# Patient Record
Sex: Female | Born: 1945 | Race: White | Hispanic: No | Smoking: Never smoker
Health system: Southern US, Academic
[De-identification: ages and names within clinical notes are randomized; demographics above are authoritative.]

## PROBLEM LIST (undated history)

## (undated) DIAGNOSIS — Z87898 Personal history of other specified conditions: Secondary | ICD-10-CM

## (undated) DIAGNOSIS — I10 Essential (primary) hypertension: Secondary | ICD-10-CM

## (undated) DIAGNOSIS — F419 Anxiety disorder, unspecified: Secondary | ICD-10-CM

## (undated) DIAGNOSIS — M2550 Pain in unspecified joint: Secondary | ICD-10-CM

## (undated) DIAGNOSIS — E876 Hypokalemia: Secondary | ICD-10-CM

## (undated) DIAGNOSIS — K219 Gastro-esophageal reflux disease without esophagitis: Secondary | ICD-10-CM

## (undated) DIAGNOSIS — F32A Depression, unspecified: Secondary | ICD-10-CM

## (undated) DIAGNOSIS — G510 Bell's palsy: Secondary | ICD-10-CM

## (undated) DIAGNOSIS — Z87440 Personal history of urinary (tract) infections: Secondary | ICD-10-CM

## (undated) DIAGNOSIS — Z9229 Personal history of other drug therapy: Secondary | ICD-10-CM

## (undated) DIAGNOSIS — N289 Disorder of kidney and ureter, unspecified: Secondary | ICD-10-CM

## (undated) HISTORY — PX: BRAIN TUMOR EXCISION: SHX577

## (undated) HISTORY — DX: Pain in unspecified joint: M25.50

## (undated) HISTORY — DX: Anxiety disorder, unspecified: F41.9

## (undated) HISTORY — DX: Personal history of other drug therapy: Z92.29

## (undated) HISTORY — DX: Essential (primary) hypertension: I10

## (undated) HISTORY — DX: Personal history of other specified conditions: Z87.898

## (undated) HISTORY — DX: Disorder of kidney and ureter, unspecified: N28.9

## (undated) HISTORY — DX: Hypokalemia: E87.6

## (undated) HISTORY — PX: HX LIPOMA RESECTION: SHX23

## (undated) HISTORY — DX: Personal history of urinary (tract) infections: Z87.440

## (undated) HISTORY — DX: Depression, unspecified: F32.A

## (undated) HISTORY — DX: Bell's palsy: G51.0

## (undated) HISTORY — DX: Gastro-esophageal reflux disease without esophagitis: K21.9

---

## 1993-11-25 ENCOUNTER — Ambulatory Visit (HOSPITAL_COMMUNITY): Payer: Self-pay

## 2020-12-23 IMAGING — US US ABDOMEN COMPLETE
2 series · 14 of 25 positions shown · non-contrast
Comparison: None.

﻿EXAM:  US ABDOMEN COMPLETE
INDICATION: Mid abdominal pain.

[Series 1: us abdomen complete · 12 of 53 slices shown (1 of 2)]
[im 1/53]
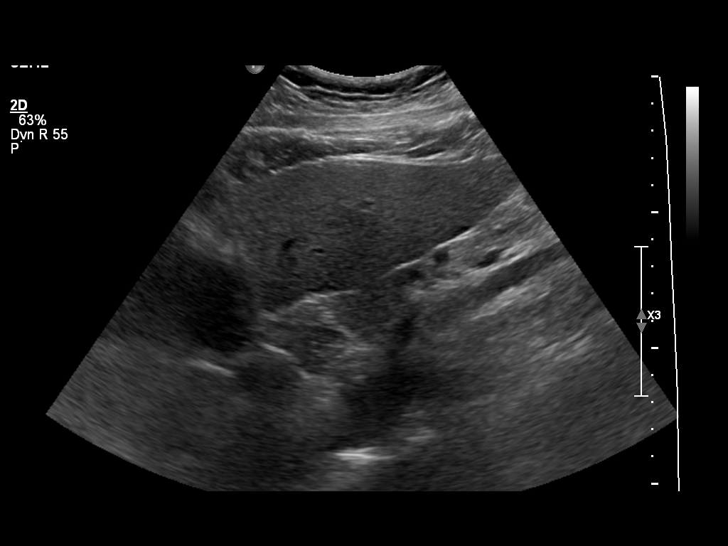
[im 6/53]
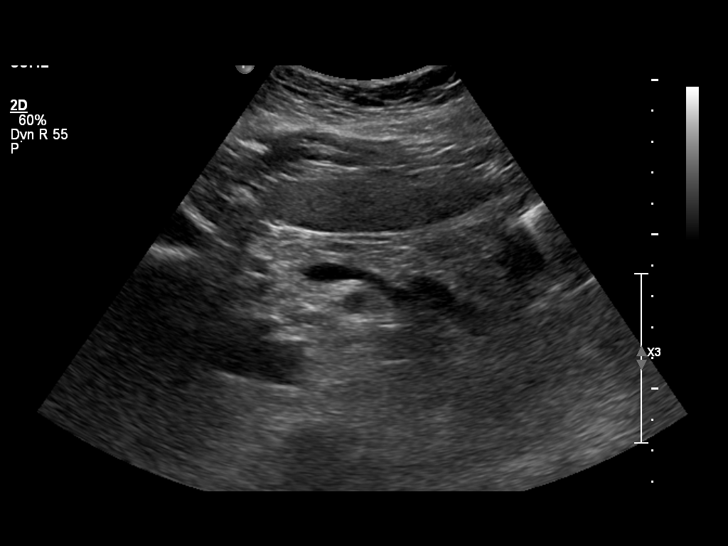
[im 11/53]
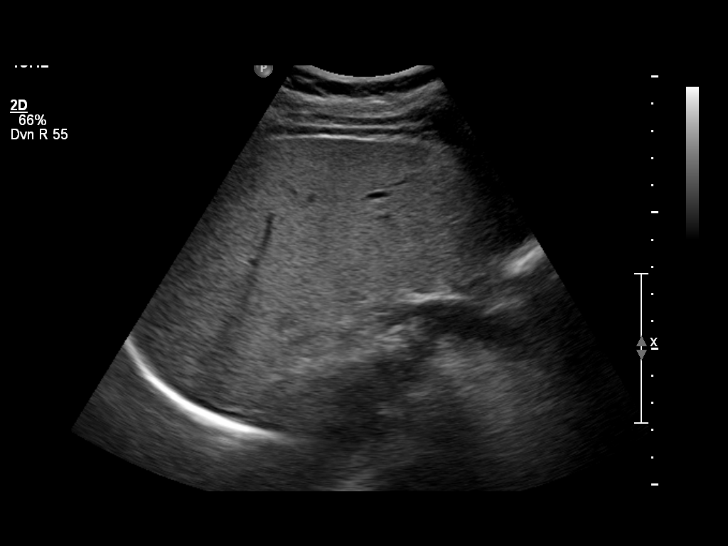
[im 16/53]
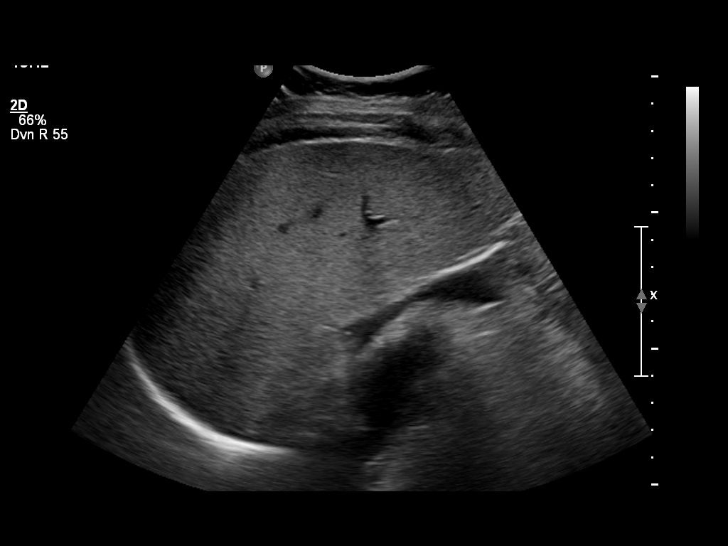
[im 21/53]
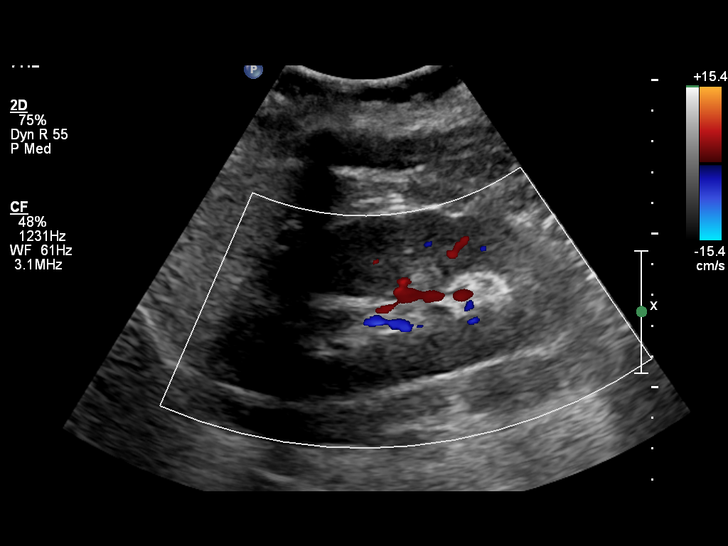
[im 24/53]
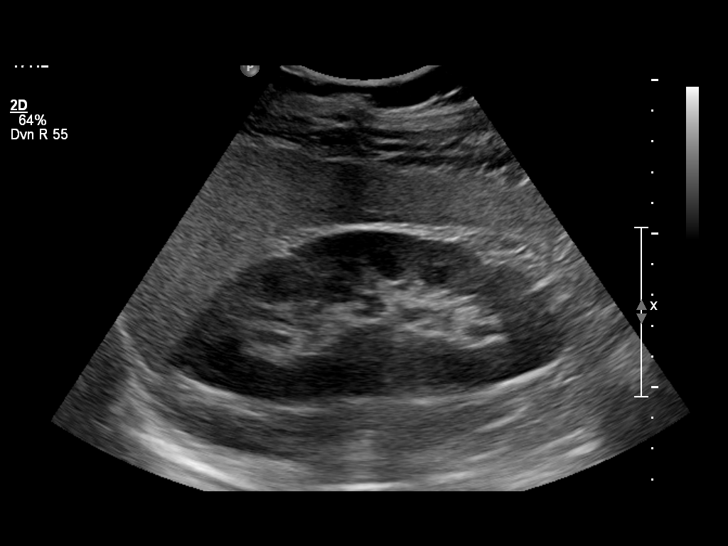
[im 29/53]
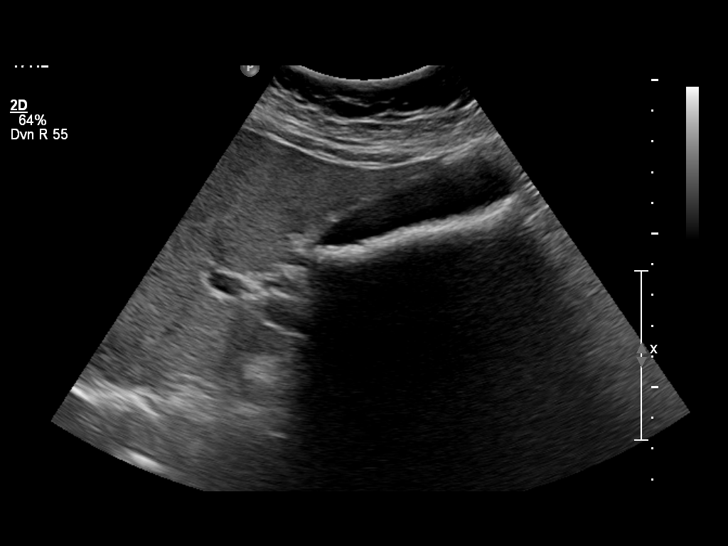
[im 34/53]
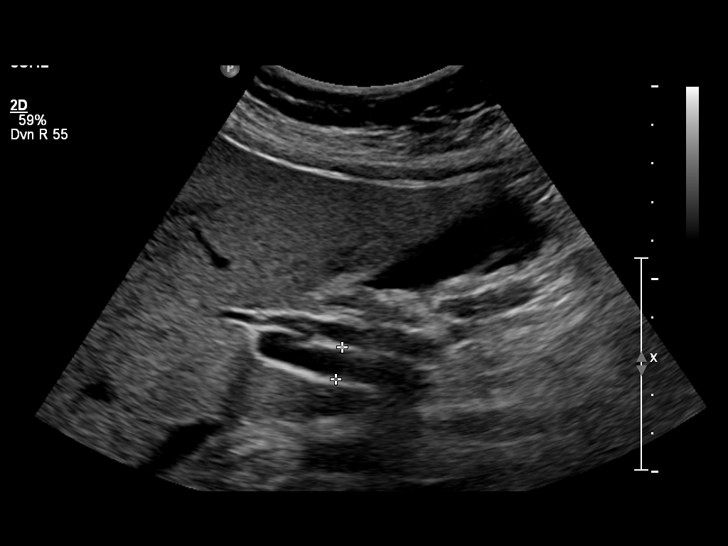
[im 40/53]
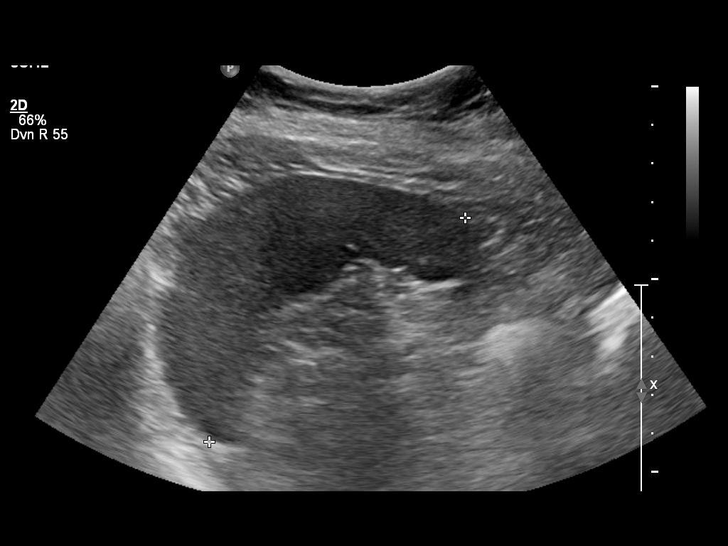
[im 42/53]
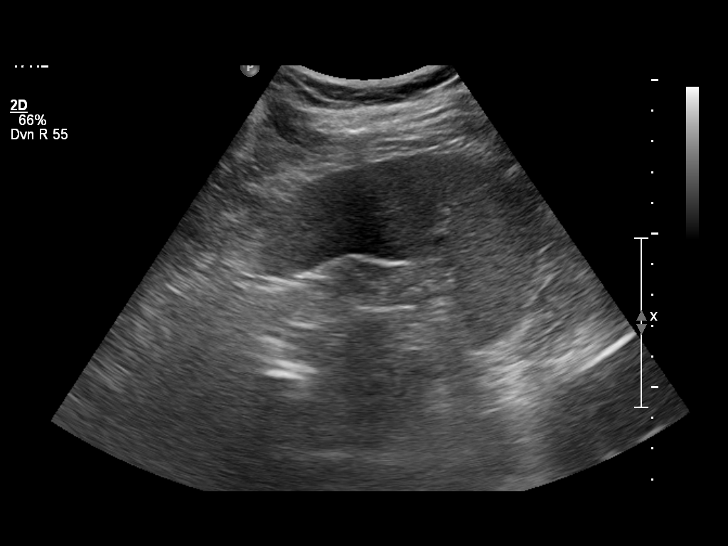
[im 47/53]
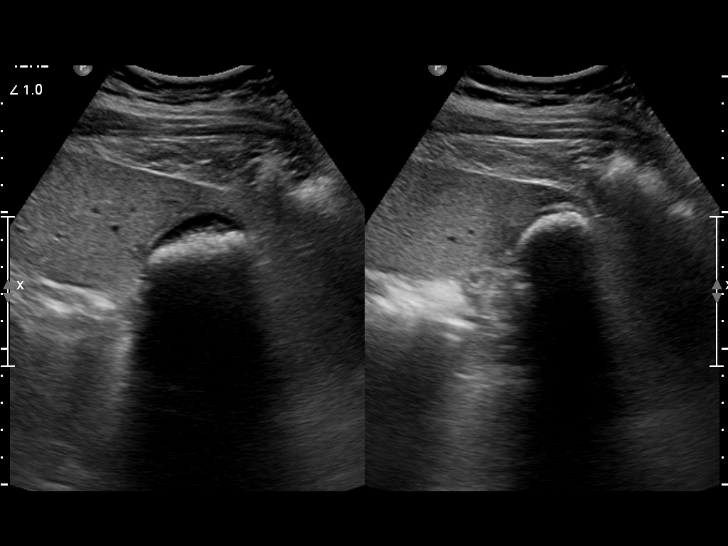
[im 53/53]
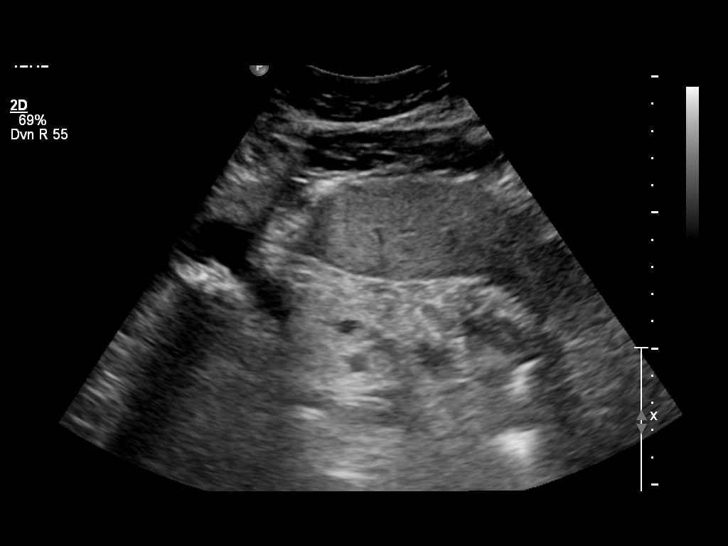

[Series 1: us abdomen complete · 2 of 10 slices shown (2 of 2)]
[im 4/10]
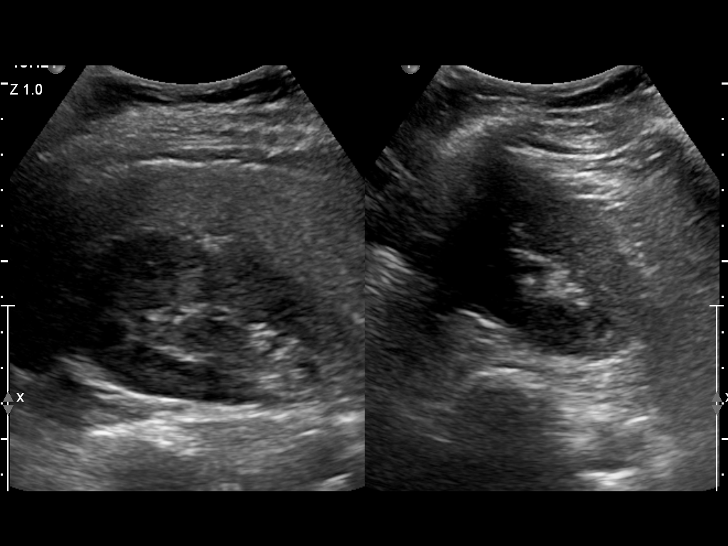
[im 10/10]
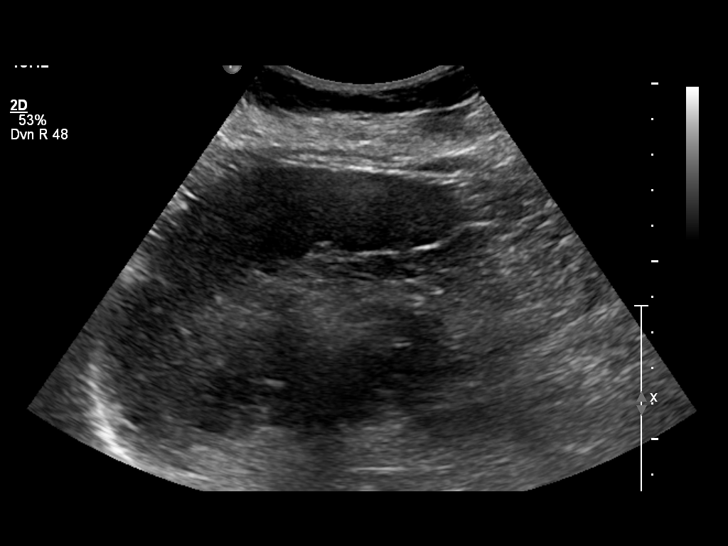

[14 of 25 positions shown; findings below may reference images not displayed]

FINDINGS: The liver appears unremarkable with no evidence of mass or biliary dilatation.  There are multiple gallstones within the gallbladder with acoustical shadowing.  There is no gallbladder wall thickening or pericholecystic fluid.  

There is a small right renal cyst.  The left kidney has been removed.  The visualized pancreas, aorta, and inferior vena cava appear unremarkable.  There is no ascites.  The spleen is not enlarged.
IMPRESSION: Cholelithiasis.

## 2021-11-16 ENCOUNTER — Encounter (RURAL_HEALTH_CENTER): Payer: Self-pay | Admitting: Physician Assistant

## 2021-11-16 DIAGNOSIS — F419 Anxiety disorder, unspecified: Secondary | ICD-10-CM | POA: Insufficient documentation

## 2021-11-16 DIAGNOSIS — M25561 Pain in right knee: Secondary | ICD-10-CM | POA: Insufficient documentation

## 2021-11-16 DIAGNOSIS — E876 Hypokalemia: Secondary | ICD-10-CM | POA: Insufficient documentation

## 2021-11-16 DIAGNOSIS — K219 Gastro-esophageal reflux disease without esophagitis: Secondary | ICD-10-CM | POA: Insufficient documentation

## 2021-11-16 DIAGNOSIS — I10 Essential (primary) hypertension: Secondary | ICD-10-CM | POA: Insufficient documentation

## 2021-11-16 DIAGNOSIS — F32A Depression, unspecified: Secondary | ICD-10-CM | POA: Insufficient documentation

## 2021-11-16 DIAGNOSIS — M25552 Pain in left hip: Secondary | ICD-10-CM | POA: Insufficient documentation

## 2021-12-11 ENCOUNTER — Encounter (RURAL_HEALTH_CENTER): Payer: Self-pay | Admitting: Physician Assistant

## 2021-12-18 ENCOUNTER — Encounter (RURAL_HEALTH_CENTER): Payer: Self-pay | Admitting: Physician Assistant

## 2021-12-23 ENCOUNTER — Ambulatory Visit (RURAL_HEALTH_CENTER): Payer: Self-pay | Admitting: Physician Assistant

## 2021-12-30 ENCOUNTER — Other Ambulatory Visit: Payer: Medicare Other | Attending: Physician Assistant | Admitting: Physician Assistant

## 2021-12-30 ENCOUNTER — Other Ambulatory Visit: Payer: Self-pay

## 2021-12-30 ENCOUNTER — Encounter (RURAL_HEALTH_CENTER): Payer: Self-pay | Admitting: Physician Assistant

## 2021-12-30 ENCOUNTER — Ambulatory Visit (RURAL_HEALTH_CENTER): Payer: Medicare Other | Attending: Physician Assistant | Admitting: Physician Assistant

## 2021-12-30 VITALS — BP 148/86 | HR 98 | Temp 98.2°F

## 2021-12-30 DIAGNOSIS — R059 Cough, unspecified: Secondary | ICD-10-CM

## 2021-12-30 DIAGNOSIS — R7309 Other abnormal glucose: Secondary | ICD-10-CM | POA: Insufficient documentation

## 2021-12-30 DIAGNOSIS — F419 Anxiety disorder, unspecified: Secondary | ICD-10-CM | POA: Insufficient documentation

## 2021-12-30 DIAGNOSIS — M2550 Pain in unspecified joint: Secondary | ICD-10-CM | POA: Insufficient documentation

## 2021-12-30 DIAGNOSIS — E876 Hypokalemia: Secondary | ICD-10-CM | POA: Insufficient documentation

## 2021-12-30 DIAGNOSIS — Z87440 Personal history of urinary (tract) infections: Secondary | ICD-10-CM | POA: Insufficient documentation

## 2021-12-30 DIAGNOSIS — R5382 Chronic fatigue, unspecified: Secondary | ICD-10-CM | POA: Insufficient documentation

## 2021-12-30 DIAGNOSIS — J40 Bronchitis, not specified as acute or chronic: Secondary | ICD-10-CM | POA: Insufficient documentation

## 2021-12-30 DIAGNOSIS — I10 Essential (primary) hypertension: Secondary | ICD-10-CM | POA: Insufficient documentation

## 2021-12-30 DIAGNOSIS — N390 Urinary tract infection, site not specified: Secondary | ICD-10-CM

## 2021-12-30 DIAGNOSIS — R5383 Other fatigue: Secondary | ICD-10-CM | POA: Insufficient documentation

## 2021-12-30 DIAGNOSIS — F32A Depression, unspecified: Secondary | ICD-10-CM | POA: Insufficient documentation

## 2021-12-30 DIAGNOSIS — Z2821 Immunization not carried out because of patient refusal: Secondary | ICD-10-CM | POA: Insufficient documentation

## 2021-12-30 DIAGNOSIS — E559 Vitamin D deficiency, unspecified: Secondary | ICD-10-CM | POA: Insufficient documentation

## 2021-12-30 DIAGNOSIS — R799 Abnormal finding of blood chemistry, unspecified: Secondary | ICD-10-CM

## 2021-12-30 DIAGNOSIS — E785 Hyperlipidemia, unspecified: Secondary | ICD-10-CM | POA: Insufficient documentation

## 2021-12-30 DIAGNOSIS — Z79899 Other long term (current) drug therapy: Secondary | ICD-10-CM | POA: Insufficient documentation

## 2021-12-30 LAB — POCT RAPID COVID-19 & FLU (AMB ONLY)
COVID-19 AG: NEGATIVE
INFLUENZA TYPE A: NEGATIVE
INFLUENZA TYPE B: NEGATIVE

## 2021-12-30 MED ORDER — PREDNISONE 20 MG TABLET
20.0000 mg | ORAL_TABLET | Freq: Two times a day (BID) | ORAL | 0 refills | Status: AC
Start: 2021-12-30 — End: 2022-01-04

## 2021-12-30 MED ORDER — DOXYCYCLINE HYCLATE 100 MG TABLET
100.0000 mg | ORAL_TABLET | Freq: Two times a day (BID) | ORAL | 0 refills | Status: AC
Start: 2021-12-30 — End: 2022-01-09

## 2021-12-30 NOTE — Nursing Note (Signed)
Patient is here for 3 month CDM and also as cough and chest congestion.

## 2021-12-30 NOTE — Progress Notes (Signed)
INTERNAL MEDICINE, [facility] INTERNAL MEDICINE [facility]  [address]  [place] [address]    History and Physical     Name: Christine Mcmillan MRN:  [id number]   Date: 12/30/2021 Age: 76 y.o.               Follow Up        Reason for Visit: Follow Up 3 Months, Coughing, and Chest Congestion    History of Present Illness  Christine Mcmillan is a 76 y.o. female who is being seen today for follow-up however she states she still coughing at times feels congested in her chest.  Sputum is thick yellow to green colored.  She took the Z-Pak is given the states it really did not help much thinks she needs something stronger.  The patient also that her Medicare coverage that she would like to go ahead and get a chest x-ray previously talked about ordered.  She has had a history of chronic cough for multiple years now.  At this time no fever chills body aches noted.  She is requesting to get checked for COVID.    Follow-up blood pressure/hypertension with reading today 148/86.  Patient does not monitor routinely outpatient but  denies any chest pain headaches dizziness.  Continues to take her Norvasc along with Bystolic.    F/U chol/lipids w pt currently on pravastatin 80mg  q hs.  At this time denies side effects or issues to medication he is here today to get her lab work obtained.    Follow up anxiety/depression.  She is currently taking lorazepam 2mg  at bedtime and overall feels like her symptoms are stable.  Denies any panic attacks new issues .     F/u abnormal glucose w/o hx of DM.  Pt currently not on BS meds nor checking     F/u vit d def w replacement noted. +chronic arthralgias noted- stable.    Patient also has a history of chronic low potassium for which labs are needed follow-up.  She also has chronic UTIs which at this time she has no current symptoms but would like to check her urine while here.  Chronic fatigue issues also noted unchanged.     COVID vaccine x2 up-to-date  Flu vaccine  refused.  mammo /bone density still not completed - waiting on insurance    PHQ Questionnaire  Little interest or pleasure in doing things.: Not at all  Feeling down, depressed, or hopeless: Not at all  PHQ 2 Total: 0            Past Medical History:   Diagnosis Date   . Abnormal renal function    . Anxiety    . Arthralgia    . Chronic hypokalemia    . COVID-19 vaccine series completed    . Depression    . Esophageal reflux    . History of memory loss    . History of recurrent UTIs    . Hypertension          Past Surgical History:   Procedure Laterality Date   . BRAIN TUMOR EXCISION     . HX LIPOMA RESECTION N/A     Lipoma on back      Family Medical History:     Problem Relation (Age of Onset)    Cancer Mother          Social History     Tobacco Use   . Smoking status: Never   . Smokeless tobacco:  Never   Substance Use Topics   . Alcohol use: Never   . Drug use: Never     Medication:  amLODIPine (NORVASC) 5 mg Oral Tablet, Take 1 Tablet (5 mg total) by mouth Once a day  celecoxib (CELEBREX) 200 mg Oral Capsule, Take 1 Capsule (200 mg total) by mouth Once a day  cetirizine (ZYRTEC) 10 mg Oral Tablet, Take 1 Tablet (10 mg total) by mouth Once a day  cyclobenzaprine (FLEXERIL) 10 mg Oral Tablet, Take 1 Tablet (10 mg total) by mouth Three times a day as needed for Muscle spasms  diclofenac sodium (PENNSAID) 2 % Apply externally Solution in Packet, Apply 1 Packet topically Twice per day as needed for Other (Knee pain)  ergocalciferol, vitamin D2, (DRISDOL) 1,250 mcg (50,000 unit) Oral Capsule, Take 1 Capsule (50,000 Units total) by mouth Every 7 days  furosemide (LASIX) 40 mg Oral Tablet, Take 1 Tablet (40 mg total) by mouth Once per day as needed for Other (Edema)  LORazepam (ATIVAN) 1 mg Oral Tablet, Take 2 Tablets (2 mg total) by mouth Every night as needed for Anxiety  NALOXONE 4 MG/ACTUATION NASAL SPRAY, 1 Spray by INTRANASAL route Every 2 minutes as needed for actual or suspected opioid overdose. Call 911 if  used.  nebivoloL (BYSTOLIC) 10 mg Oral Tablet, Take 1 Tablet (10 mg total) by mouth Once a day  omeprazole (PRILOSEC) 20 mg Oral Capsule, Delayed Release(E.C.), Take 1 Capsule (20 mg total) by mouth Once a day  potassium chloride (MICRO K) 10 mEq Oral Capsule, Sustained Release, Take 1 Capsule (10 mEq total) by mouth Once a day  pravastatin (PRAVACHOL) 80 mg Oral Tablet, Take 1 Tablet (80 mg total) by mouth Every night    No facility-administered medications prior to visit.    Allergies:  Allergies   Allergen Reactions   . Sertraline  Other Adverse Reaction (Add comment)     Headache         Physical Exam:  Vitals:    12/30/21 1405   BP: (!) 148/86   Pulse: 98   Temp: 36.8 C (98.2 F)   TempSrc: Tympanic   SpO2: 96%      Physical Exam  Vitals and nursing note reviewed.   Constitutional:       Appearance: Normal appearance.   HENT:      Right Ear: Tympanic membrane normal.      Left Ear: Tympanic membrane normal.      Mouth/Throat:      Mouth: Mucous membranes are moist.      Pharynx: Oropharynx is clear.   Eyes:      Pupils: Pupils are equal, round, and reactive to light.   Cardiovascular:      Rate and Rhythm: Normal rate and regular rhythm.      Pulses: Normal pulses.   Pulmonary:      Effort: Pulmonary effort is normal.      Breath sounds: Normal breath sounds.      Comments: Harsh hacky cough  Abdominal:      General: Bowel sounds are normal.      Palpations: Abdomen is soft.      Tenderness: There is no abdominal tenderness.   Musculoskeletal:         General: No swelling or tenderness. Normal range of motion.      Cervical back: Normal range of motion and neck supple.      Comments: Arthritic changes arthritic changes hands and knees noted   Skin:  General: Skin is warm.      Findings: No lesion or rash.   Neurological:      General: No focal deficit present.      Mental Status: She is alert and oriented to person, place, and time.   Psychiatric:         Mood and Affect: Mood normal.         Behavior:  Behavior normal.         Thought Content: Thought content normal.         Judgment: Judgment normal.         Assessment/Plan:  Problem List Items Addressed This Visit        Cardiovascular System    Essential hypertension    Relevant Orders    COMPREHENSIVE METABOLIC PANEL, NON-FASTING       Psychiatric    Anxiety and depression       Other    Chronic hypokalemia    Relevant Orders    MAGNESIUM   Other Visit Diagnoses     Bronchitis    -  Primary    Cough, unspecified type  (Chronic)       Relevant Orders    POCT Rapid Covid & Flu (Sofia) (Completed)    XR CHEST PA AND LATERAL    Hyperlipidemia, unspecified hyperlipidemia type  (Chronic)       Currently on pravastatin 80 mg nightly    Relevant Orders    LIPID PANEL    Abnormal glucose  (Chronic)       No med use noted  Diet encouraged    Relevant Orders    HGA1C (HEMOGLOBIN A1C WITH EST AVG GLUCOSE)    Recurrent UTI  (Chronic)       Relevant Orders    POCT Urine Dipstick    URINE CULTURE    Fatigue, unspecified type  (Chronic)       Relevant Orders    CBC/DIFF    IRON    IRON TRANSFERRIN AND TIBC    THYROID STIMULATING HORMONE WITH FREE T4 REFLEX    VITAMIN B12    Vitamin D deficiency  (Chronic)       Currently on 50000 units weekly    Relevant Orders    VITAMIN D 25 TOTAL    Abnormal finding of blood chemistry, unspecified        Relevant Orders    IRON    IRON TRANSFERRIN AND TIBC         Labs ordered in office  Doxy 100 mg bid x 10 days  pred 20 mg bid x 5 days  Continue cough med as ordered  CXR ordered  Rest fluids tylenol/motrin prn  F/u pending results otherwise             Follow up: Return in about 2 months (around 03/01/2022) for In Person Visit.  Seek medical attention for new or worsening symptoms.  Patient has been seen in this clinic within the last 3 years.     Anterio Scheel, PA-C          This note was partially created using MModal Fluency Direct system (voice recognition software) and is inherently subject to errors including those of syntax and  "sound-alike" substitutions which may escape proofreading.  In such instances, original meaning may be extrapolated by contextual derivation.

## 2021-12-31 LAB — CBC WITH DIFF
BASOPHIL #: 0 10*3/uL (ref 0.00–2.50)
BASOPHIL %: 1 % (ref 0–3)
EOSINOPHIL #: 0.3 10*3/uL (ref 0.00–2.40)
EOSINOPHIL %: 4 % (ref 0–7)
HCT: 45.5 % (ref 37.0–47.0)
HGB: 15.7 g/dL (ref 12.5–16.0)
LYMPHOCYTE #: 3 10*3/uL (ref 2.10–11.00)
LYMPHOCYTE %: 37 % (ref 25–45)
MCH: 29.5 pg (ref 27.0–32.0)
MCHC: 34.4 g/dL (ref 32.0–36.0)
MCV: 85.7 fL (ref 78.0–99.0)
MONOCYTE #: 0.7 10*3/uL (ref 0.00–4.10)
MONOCYTE %: 8 % (ref 0–12)
MPV: 8.9 fL (ref 7.4–10.4)
NEUTROPHIL #: 4 10*3/uL — ABNORMAL LOW (ref 4.10–29.00)
NEUTROPHIL %: 50 % (ref 40–76)
PLATELETS: 425 10*3/uL (ref 140–440)
RBC: 5.32 10*6/uL (ref 4.20–5.40)
RDW: 13.6 % (ref 11.6–14.8)
WBC: 7.9 10*3/uL (ref 4.0–10.5)
WBCS UNCORRECTED: 7.9 10*3/uL

## 2021-12-31 LAB — COMPREHENSIVE METABOLIC PANEL, NON-FASTING
ALBUMIN/GLOBULIN RATIO: 1.4 (ref 0.8–1.4)
ALBUMIN: 4.5 g/dL (ref 3.5–5.7)
ALKALINE PHOSPHATASE: 75 U/L (ref 34–104)
ALT (SGPT): 36 U/L (ref 7–52)
ANION GAP: 12 mmol/L (ref 10–20)
AST (SGOT): 36 U/L (ref 13–39)
BILIRUBIN TOTAL: 0.7 mg/dL (ref 0.3–1.2)
BUN/CREA RATIO: 12 (ref 6–22)
BUN: 16 mg/dL (ref 7–25)
CALCIUM, CORRECTED: 9.3 mg/dL (ref 8.9–10.8)
CALCIUM: 9.8 mg/dL (ref 8.6–10.3)
CHLORIDE: 99 mmol/L (ref 98–107)
CO2 TOTAL: 25 mmol/L (ref 21–31)
CREATININE: 1.36 mg/dL — ABNORMAL HIGH (ref 0.60–1.30)
ESTIMATED GFR: 40 mL/min/{1.73_m2} — ABNORMAL LOW (ref >59)
GLOBULIN: 3.3 (ref 2.9–5.4)
GLUCOSE: 133 mg/dL — ABNORMAL HIGH (ref 74–109)
OSMOLALITY, CALCULATED: 275 mOsm/kg (ref 270–290)
POTASSIUM: 4.1 mmol/L (ref 3.5–5.1)
PROTEIN TOTAL: 7.8 g/dL (ref 6.4–8.9)
SODIUM: 136 mmol/L (ref 136–145)

## 2021-12-31 LAB — MAGNESIUM: MAGNESIUM: 2 mg/dL (ref 1.9–2.7)

## 2021-12-31 LAB — LIPID PANEL
CHOL/HDL RATIO: 4.4
CHOLESTEROL: 244 mg/dL (ref 136–290)
HDL CHOL: 56 mg/dL (ref 23–92)
LDL CALC: 151 mg/dL — ABNORMAL HIGH (ref 0–100)
TRIGLYCERIDES: 185 mg/dL — ABNORMAL HIGH (ref <=150)
VLDL CALC: 37 mg/dL (ref 0–50)

## 2021-12-31 LAB — VITAMIN D 25 TOTAL: VITAMIN D: 28 ng/mL — ABNORMAL LOW (ref 30–100)

## 2021-12-31 LAB — IRON TRANSFERRIN AND TIBC
IRON (TRANSFERRIN) SATURATION: 29 % (ref 15–50)
IRON: 102 ug/dL (ref 50–212)
TOTAL IRON BINDING CAPACITY: 350 ug/dL (ref 250–450)
TRANSFERRIN: 250 mg/dL (ref 203–362)
UIBC: 248 ug/dL (ref 130–375)

## 2021-12-31 LAB — VITAMIN B12: VITAMIN B 12: 346 pg/mL (ref 180–914)

## 2021-12-31 LAB — HGA1C (HEMOGLOBIN A1C WITH EST AVG GLUCOSE): HEMOGLOBIN A1C: 6.3 % — ABNORMAL HIGH (ref 4.0–6.0)

## 2021-12-31 LAB — THYROID STIMULATING HORMONE WITH FREE T4 REFLEX: TSH: 2.289 u[IU]/mL (ref 0.450–5.330)

## 2022-01-02 LAB — URINE CULTURE: URINE CULTURE: 5000 — AB

## 2022-01-06 ENCOUNTER — Ambulatory Visit (RURAL_HEALTH_CENTER): Payer: Medicare Other | Attending: Physician Assistant | Admitting: Physician Assistant

## 2022-01-06 ENCOUNTER — Other Ambulatory Visit: Payer: Self-pay

## 2022-01-06 ENCOUNTER — Encounter (RURAL_HEALTH_CENTER): Payer: Self-pay | Admitting: Physician Assistant

## 2022-01-06 DIAGNOSIS — R0982 Postnasal drip: Secondary | ICD-10-CM | POA: Insufficient documentation

## 2022-01-06 DIAGNOSIS — R053 Chronic cough: Secondary | ICD-10-CM | POA: Insufficient documentation

## 2022-01-06 DIAGNOSIS — Z91199 Patient's noncompliance with other medical treatment and regimen due to unspecified reason: Secondary | ICD-10-CM | POA: Insufficient documentation

## 2022-01-06 DIAGNOSIS — G4700 Insomnia, unspecified: Secondary | ICD-10-CM | POA: Insufficient documentation

## 2022-01-06 DIAGNOSIS — K219 Gastro-esophageal reflux disease without esophagitis: Secondary | ICD-10-CM | POA: Insufficient documentation

## 2022-01-06 DIAGNOSIS — T887XXA Unspecified adverse effect of drug or medicament, initial encounter: Secondary | ICD-10-CM

## 2022-01-06 MED ORDER — OMEPRAZOLE 20 MG CAPSULE,DELAYED RELEASE
20.0000 mg | DELAYED_RELEASE_CAPSULE | Freq: Every day | ORAL | 5 refills | Status: DC
Start: 2022-01-06 — End: 2022-01-25

## 2022-01-06 MED ORDER — LORAZEPAM 1 MG TABLET
2.0000 mg | ORAL_TABLET | Freq: Every evening | ORAL | 0 refills | Status: DC | PRN
Start: 2022-01-06 — End: 2022-01-21

## 2022-01-06 MED ORDER — PROMETHAZINE 6.25 MG-CODEINE 10 MG/5 ML SYRUP
5.0000 mL | ORAL_SOLUTION | Freq: Four times a day (QID) | ORAL | 0 refills | Status: DC | PRN
Start: 2022-01-06 — End: 2022-01-28

## 2022-01-06 NOTE — Nursing Note (Signed)
Patient wants to talk to provide about medication.

## 2022-01-06 NOTE — Progress Notes (Signed)
INTERNAL MEDICINE, [facility]  [address] [address]    Telephone Visit    Name:  Christine Mcmillan MRN: [id number]   Date:  01/06/2022 Age:   76 y.o.     The patient/family initiated a request for telephone service.  Verbal consent for this service was obtained from the patient/family.    Last office visit in this department: 12/30/2021      Reason for call:  Medication change not sleeping  Call notes:  Patient called today requesting to be seen by TeleMed to discuss her recent medication change.  His noted she was seen March 8th at which time she was having trouble sleeping for which she had been on Ativan 2 mg nightly and noted that it was not working like it previously had.  She has been on several other medications in the past without success so we started her on trazodone 50 mg nightly.  Patient states she could tell that the medication called her little bit but she was unable to fall asleep and did not sleep at all last night per her report.  She also noted that her ankles and eyes were swollen thought maybe the medication had called this so she is requesting to DC trazodone and go back to Ativan 2 mg nightly.  Patient also complaining of sneezing with cough and congestion.  She has a history of chronic cough noted for which she has become reliant on prometh w cod cough syrup.  Patient requesting a refill of that today also.  Currently she has no complaints of fever chills body aches and drainage is noted to be clear in color.    Patient also informs me that on Sunday she went to the ER with complaints of chest pain radiating to her left shoulder nausea.  Workup revealed her heart to be okay and diagnosis was given is acid reflux.  Patient was given Protonix 40 mg daily but she tells me the Prilosec I had given her previously did better on the Protonix so she is requesting another prescription of it today.  Patient states that the ER gave her copies of  her test including EKG she will bring to me on next visit April 3rd.  I discussed with her as well need for cardiac eval outpatient and possible stress test however she refused for me to go ahead and do the referral and stated that she will talk to me when she comes in April and I review all her testing 1st.  It is noted this patient is very poorly compliant with medication use screening test/preventative testing.    Physical Exam:    Physical Exam  Constitutional:       General: She is not in acute distress.  Pulmonary:      Effort: Pulmonary effort is normal.      Comments: Pt able to speak in complete sentences    Neurological:      Mental Status: She is alert.   Psychiatric:         Mood and Affect: Mood normal.         Behavior: Behavior normal.         Thought Content: Thought content normal.         Judgment: Judgment normal.           ICD-10-CM    1. Insomnia, unspecified type  G47.00       2. Medication side effect  T88.7XXA  3. Chronic cough  R05.3       4. PND (post-nasal drip)  R09.82       5. Chronic GERD  K21.9       6. Patient noncompliance, general  Z91.199         Will DC trazodone due to reported side effects  Restart Ativan 2 mg nightly-prescription given  DC Protonix  Restart Prilosec 20 mg daily  Diet discussed  Promethazine with codeine p.r.n. cough given  To continue over-the-counter allergy medicine daily  Once again patient refused referral to cardiologist  Patient to bring all records from ER visit for review at her next appointment April 3rd  Any acute issues return to ER    Total provider time spent with the patient on the phone: 22 minutes.    Camelia Stelzner, PA-C

## 2022-01-21 ENCOUNTER — Ambulatory Visit (RURAL_HEALTH_CENTER): Payer: Self-pay | Admitting: Physician Assistant

## 2022-01-21 ENCOUNTER — Other Ambulatory Visit (RURAL_HEALTH_CENTER): Payer: Self-pay | Admitting: Physician Assistant

## 2022-01-21 MED ORDER — LORAZEPAM 1 MG TABLET
2.0000 mg | ORAL_TABLET | Freq: Every evening | ORAL | 0 refills | Status: DC | PRN
Start: 2022-01-21 — End: 2022-01-28

## 2022-01-25 ENCOUNTER — Other Ambulatory Visit: Payer: Self-pay

## 2022-01-25 ENCOUNTER — Ambulatory Visit (RURAL_HEALTH_CENTER): Payer: Medicare Other | Attending: Physician Assistant | Admitting: Physician Assistant

## 2022-01-25 ENCOUNTER — Encounter (RURAL_HEALTH_CENTER): Payer: Self-pay | Admitting: Physician Assistant

## 2022-01-25 ENCOUNTER — Ambulatory Visit: Payer: Medicare Other | Attending: Physician Assistant | Admitting: Physician Assistant

## 2022-01-25 VITALS — BP 138/78 | HR 77 | Temp 98.7°F | Ht 60.0 in | Wt 163.0 lb

## 2022-01-25 DIAGNOSIS — R399 Unspecified symptoms and signs involving the genitourinary system: Secondary | ICD-10-CM | POA: Insufficient documentation

## 2022-01-25 DIAGNOSIS — R252 Cramp and spasm: Secondary | ICD-10-CM | POA: Insufficient documentation

## 2022-01-25 DIAGNOSIS — D72829 Elevated white blood cell count, unspecified: Secondary | ICD-10-CM | POA: Insufficient documentation

## 2022-01-25 DIAGNOSIS — R053 Chronic cough: Secondary | ICD-10-CM | POA: Insufficient documentation

## 2022-01-25 DIAGNOSIS — K219 Gastro-esophageal reflux disease without esophagitis: Secondary | ICD-10-CM

## 2022-01-25 DIAGNOSIS — N289 Disorder of kidney and ureter, unspecified: Secondary | ICD-10-CM

## 2022-01-25 DIAGNOSIS — Z79899 Other long term (current) drug therapy: Secondary | ICD-10-CM | POA: Insufficient documentation

## 2022-01-25 DIAGNOSIS — Z2839 Other underimmunization status: Secondary | ICD-10-CM | POA: Insufficient documentation

## 2022-01-25 DIAGNOSIS — I10 Essential (primary) hypertension: Secondary | ICD-10-CM | POA: Insufficient documentation

## 2022-01-25 DIAGNOSIS — R944 Abnormal results of kidney function studies: Secondary | ICD-10-CM | POA: Insufficient documentation

## 2022-01-25 DIAGNOSIS — Z2821 Immunization not carried out because of patient refusal: Secondary | ICD-10-CM | POA: Insufficient documentation

## 2022-01-25 LAB — CBC WITH DIFF
BASOPHIL #: 0.1 10*3/uL (ref 0.00–2.50)
BASOPHIL %: 1 % (ref 0–3)
EOSINOPHIL #: 0.3 10*3/uL (ref 0.00–2.40)
EOSINOPHIL %: 3 % (ref 0–7)
HCT: 45.4 % (ref 37.0–47.0)
HGB: 15.6 g/dL (ref 12.5–16.0)
LYMPHOCYTE #: 2.9 10*3/uL (ref 2.10–11.00)
LYMPHOCYTE %: 28 % (ref 25–45)
MCH: 29.4 pg (ref 27.0–32.0)
MCHC: 34.4 g/dL (ref 32.0–36.0)
MCV: 85.4 fL (ref 78.0–99.0)
MONOCYTE #: 0.6 10*3/uL (ref 0.00–4.10)
MONOCYTE %: 6 % (ref 0–12)
MPV: 8.6 fL (ref 7.4–10.4)
NEUTROPHIL #: 6.6 10*3/uL (ref 4.10–29.00)
NEUTROPHIL %: 63 % (ref 40–76)
PLATELETS: 340 10*3/uL (ref 140–440)
RBC: 5.32 10*6/uL (ref 4.20–5.40)
RDW: 14.4 % (ref 11.6–14.8)
WBC: 10.4 10*3/uL (ref 4.0–10.5)
WBCS UNCORRECTED: 10.4 10*3/uL

## 2022-01-25 LAB — MAGNESIUM: MAGNESIUM: 1.9 mg/dL (ref 1.9–2.7)

## 2022-01-25 LAB — COMPREHENSIVE METABOLIC PANEL, NON-FASTING
ALBUMIN/GLOBULIN RATIO: 1.6 — ABNORMAL HIGH (ref 0.8–1.4)
ALBUMIN: 4.5 g/dL (ref 3.5–5.7)
ALKALINE PHOSPHATASE: 73 U/L (ref 34–104)
ALT (SGPT): 20 U/L (ref 7–52)
ANION GAP: 11 mmol/L (ref 10–20)
AST (SGOT): 24 U/L (ref 13–39)
BILIRUBIN TOTAL: 0.5 mg/dL (ref 0.3–1.2)
BUN/CREA RATIO: 23 — ABNORMAL HIGH (ref 6–22)
BUN: 26 mg/dL — ABNORMAL HIGH (ref 7–25)
CALCIUM, CORRECTED: 9.2 mg/dL (ref 8.9–10.8)
CALCIUM: 9.7 mg/dL (ref 8.6–10.3)
CHLORIDE: 99 mmol/L (ref 98–107)
CO2 TOTAL: 23 mmol/L (ref 21–31)
CREATININE: 1.11 mg/dL (ref 0.60–1.30)
ESTIMATED GFR: 51 mL/min/{1.73_m2} — ABNORMAL LOW (ref >59)
GLOBULIN: 2.9 (ref 2.9–5.4)
GLUCOSE: 169 mg/dL — ABNORMAL HIGH (ref 74–109)
OSMOLALITY, CALCULATED: 275 mOsm/kg (ref 270–290)
POTASSIUM: 3.5 mmol/L (ref 3.5–5.1)
PROTEIN TOTAL: 7.4 g/dL (ref 6.4–8.9)
SODIUM: 133 mmol/L — ABNORMAL LOW (ref 136–145)

## 2022-01-25 MED ORDER — GUAIFENESIN 100 MG/5 ML ORAL LIQUID
200.0000 mg | Freq: Four times a day (QID) | ORAL | 0 refills | Status: DC | PRN
Start: 2022-01-25 — End: 2022-04-12

## 2022-01-25 MED ORDER — OMEPRAZOLE 20 MG CAPSULE,DELAYED RELEASE
20.0000 mg | DELAYED_RELEASE_CAPSULE | Freq: Every day | ORAL | 5 refills | Status: AC
Start: 2022-01-25 — End: 2022-02-24

## 2022-01-25 MED ORDER — FUROSEMIDE 40 MG TABLET
40.0000 mg | ORAL_TABLET | Freq: Every day | ORAL | 3 refills | Status: DC | PRN
Start: 2022-01-25 — End: 2022-07-22

## 2022-01-25 MED ORDER — NEBIVOLOL 10 MG TABLET
10.0000 mg | ORAL_TABLET | Freq: Every day | ORAL | 1 refills | Status: DC
Start: 2022-01-25 — End: 2022-02-22

## 2022-01-25 NOTE — Nursing Note (Signed)
Patient in office today for 3 month follow up and refill .   And patient is sneezing and coughing and 2 or 3 weeks.

## 2022-01-25 NOTE — Progress Notes (Signed)
[facility], [facility]  [address]  [place] [id number]    History and Physical     Name: Christine Mcmillan [place] MRN:  [id number]   Date: 01/25/2022 Age: 76 y.o.               Follow Up        Reason for Visit: Follow Up 3 Months    History of Present Illness  Christine Mcmillan [place] is a 76 y.o. female who is being seen today in the office  for follow-up however acute complaints noted.  Pt states she has been burning w urination and pressure noted all weekend so wants urine checked. Denies any itching odor fever chills or abd pain.  Continued issues w cough worse at night and when laying down. Pt does also have GERD issues and due to insurance coverage has not seen a GI but now has medicare so would like a referral. No current SOB abd pain noted.  +leg cramps as well as cramping in hands also noted. Pt does take fluid pill so ? If K low. Labs needed    Follow-up blood pressure/hypertension with reading today 138/78. Patient does not monitor routinely outpatient but  denies any chest pain headaches dizziness.  Continues to take her Norvasc along with Bystolic.    F/U abnormal labs w wbc elevated at 15.2 and repeat needed. Pt also had low NA 133 and GFR 47. Pt referred to see nephrologist however due insurance did not comply w appt but states will now go. F/u labs needed today     COVID vaccine x2 up-to-date  Flu vaccine refused.  mammo /bone density to be completed since medicare now noted    PHQ Questionnaire  Little interest or pleasure in doing things.: Not at all  Feeling down, depressed, or hopeless: Not at all  PHQ 2 Total: 0        Past Medical History:   Diagnosis Date   . Abnormal renal function    . Anxiety    . Arthralgia    . Chronic hypokalemia    . COVID-19 vaccine series completed    . Depression    . Esophageal reflux    . History of memory loss    . History of recurrent UTIs    . Hypertension          Past Surgical History:   Procedure Laterality Date   .  BRAIN TUMOR EXCISION     . HX LIPOMA RESECTION N/A     Lipoma on back      Family Medical History:     Problem Relation (Age of Onset)    Cancer Mother          Social History     Tobacco Use   . Smoking status: Never   . Smokeless tobacco: Never   Substance Use Topics   . Alcohol use: Never   . Drug use: Never     Medication:  amLODIPine (NORVASC) 5 mg Oral Tablet, Take 1 Tablet (5 mg total) by mouth Once a day  celecoxib (CELEBREX) 200 mg Oral Capsule, Take 1 Capsule (200 mg total) by mouth Once a day (Patient not taking: Reported on 01/06/2022)  cetirizine (ZYRTEC) 10 mg Oral Tablet, Take 1 Tablet (10 mg total) by mouth Once a day (Patient not taking: Reported on 01/06/2022)  cyclobenzaprine (FLEXERIL) 10 mg Oral Tablet, Take 1 Tablet (10 mg total) by mouth Three times a day as  needed for Muscle spasms (Patient not taking: Reported on 01/06/2022)  diclofenac sodium (PENNSAID) 2 % Apply externally Solution in Packet, Apply 1 Packet topically Twice per day as needed for Other (Knee pain) (Patient not taking: Reported on 01/06/2022)  ergocalciferol, vitamin D2, (DRISDOL) 1,250 mcg (50,000 unit) Oral Capsule, Take 1 Capsule (50,000 Units total) by mouth Every 7 days  NALOXONE 4 MG/ACTUATION NASAL SPRAY, 1 Spray by INTRANASAL route Every 2 minutes as needed for actual or suspected opioid overdose. Call 911 if used.  potassium chloride (MICRO K) 10 mEq Oral Capsule, Sustained Release, Take 1 Capsule (10 mEq total) by mouth Once a day (Patient not taking: Reported on 01/06/2022)  pravastatin (PRAVACHOL) 80 mg Oral Tablet, Take 1 Tablet (80 mg total) by mouth Every night  furosemide (LASIX) 40 mg Oral Tablet, Take 1 Tablet (40 mg total) by mouth Once per day as needed for Other (Edema)  LORazepam (ATIVAN) 1 mg Oral Tablet, Take 2 Tablets (2 mg total) by mouth Every night as needed for Anxiety for up to 30 days  nebivoloL (BYSTOLIC) 10 mg Oral Tablet, Take 1 Tablet (10 mg total) by mouth Once a day  omeprazole (PRILOSEC) 20 mg  Oral Capsule, Delayed Release(E.C.), Take 1 Capsule (20 mg total) by mouth Once a day for 30 days  promethazine-codeine (PHENERGAN WITH CODEINE) 6.25-10 mg/5 mL Oral Syrup, Take 5 mL by mouth Four times a day as needed for Cough for up to 10 days    No facility-administered medications prior to visit.    Allergies:  Allergies   Allergen Reactions   . Acetic Acid-Aluminum Acetate Swelling   . Sertraline  Other Adverse Reaction (Add comment)     Headache         Physical Exam:  Vitals:    01/25/22 1308   BP: 138/78   Pulse: 77   Temp: 37.1 C (98.7 F)   TempSrc: Tympanic   SpO2: 98%   Weight: 73.9 kg (163 lb)   Height: 1.524 m (5')   BMI: 31.9      Physical Exam  Vitals and nursing note reviewed.   Constitutional:       Appearance: Normal appearance.      Comments: overwt   HENT:      Right Ear: Tympanic membrane normal.      Left Ear: Tympanic membrane normal.      Mouth/Throat:      Mouth: Mucous membranes are moist.      Pharynx: Oropharynx is clear.   Eyes:      Pupils: Pupils are equal, round, and reactive to light.   Cardiovascular:      Rate and Rhythm: Normal rate and regular rhythm.      Pulses: Normal pulses.   Pulmonary:      Effort: Pulmonary effort is normal.      Breath sounds: Normal breath sounds.      Comments: Harsh hacky cough  Abdominal:      General: Bowel sounds are normal.      Palpations: Abdomen is soft.      Tenderness: There is no abdominal tenderness. There is no right CVA tenderness or left CVA tenderness.   Musculoskeletal:         General: No swelling or tenderness. Normal range of motion.      Cervical back: Normal range of motion and neck supple.      Comments: Arthritic changes arthritic changes hands and knees noted   Skin:  General: Skin is warm.      Findings: No lesion or rash.   Neurological:      General: No focal deficit present.      Mental Status: She is alert and oriented to person, place, and time.   Psychiatric:         Mood and Affect: Mood normal.         Behavior:  Behavior normal.         Thought Content: Thought content normal.         Judgment: Judgment normal.     Urine Dip  Neg    Assessment/Plan:  Problem List Items Addressed This Visit        Cardiovascular System    Essential hypertension       Respiratory    Chronic cough    Relevant Orders    Referral to External Provider (AMB)       Digestive    Chronic GERD    Relevant Orders    Referral to External Provider (AMB)       Other    Abnormal renal function    Relevant Orders    COMPREHENSIVE METABOLIC PANEL, NON-FASTING (Completed)   Other Visit Diagnoses     Urinary symptom or sign    -  Primary    Relevant Orders    POCT URINE DIPSTICK    URINE CULTURE (Completed)    Cramp in limb        Relevant Orders    MAGNESIUM (Completed)    Leukocytosis, unspecified type        Relevant Orders    CBC/DIFF (Completed)         Labs ordered in office  Guiatuss 100/62ml prn cough  Increase Prilosec 20 mg bid  Referred to GI  Urine sent for culture  Continue increased hydration  D mannose 500 mg bid OTC  To fu w nephrologist as scheduled  To complete screening test as ordered  F/u pending results otherwise        Follow up: Return in about 3 months (around 04/26/2022) for In Person Visit.  Seek medical attention for new or worsening symptoms.  Patient has been seen in this clinic within the last 3 years.     Divine Imber, PA-C          This note was partially created using MModal Fluency Direct system (voice recognition software) and is inherently subject to errors including those of syntax and "sound-alike" substitutions which may escape proofreading.  In such instances, original meaning may be extrapolated by contextual derivation.

## 2022-01-27 ENCOUNTER — Telehealth (RURAL_HEALTH_CENTER): Payer: Self-pay | Admitting: Physician Assistant

## 2022-01-27 DIAGNOSIS — N289 Disorder of kidney and ureter, unspecified: Secondary | ICD-10-CM

## 2022-01-27 LAB — URINE CULTURE: URINE CULTURE: 100000 — AB

## 2022-01-27 NOTE — Telephone Encounter (Signed)
-----   Message from Cleophus Molt, [place] sent at 01/27/2022 12:59 PM EDT -----  See above note  Urine culture needs to be rechecked had significant growth but mixed flora

## 2022-01-27 NOTE — Telephone Encounter (Signed)
I spoke with patient and informed her of her results. Patient states that she has not been to a Nephrologist nor has been referred to one, pt stated that she wants to check around for Nephrologists and then she will call back by Friday. She stated "can she not just order an ultrasound right now"? Patient stated that she has not had a renal ultrasound done recently, but she did have an abdominal ultrasound done on 12/23/20. Her last HgA1C was on 12/30/21 and was 6.3. Patient has not completed urine protein.

## 2022-01-28 ENCOUNTER — Encounter (RURAL_HEALTH_CENTER): Payer: Self-pay | Admitting: Physician Assistant

## 2022-01-28 ENCOUNTER — Ambulatory Visit (RURAL_HEALTH_CENTER): Payer: Medicare Other | Attending: Physician Assistant | Admitting: Physician Assistant

## 2022-01-28 ENCOUNTER — Other Ambulatory Visit: Payer: Self-pay

## 2022-01-28 DIAGNOSIS — R5382 Chronic fatigue, unspecified: Secondary | ICD-10-CM | POA: Insufficient documentation

## 2022-01-28 DIAGNOSIS — G4700 Insomnia, unspecified: Secondary | ICD-10-CM | POA: Insufficient documentation

## 2022-01-28 DIAGNOSIS — Z91199 Patient's noncompliance with other medical treatment and regimen due to unspecified reason: Secondary | ICD-10-CM | POA: Insufficient documentation

## 2022-01-28 DIAGNOSIS — R053 Chronic cough: Secondary | ICD-10-CM | POA: Insufficient documentation

## 2022-01-28 MED ORDER — DIAZEPAM 5 MG TABLET
5.0000 mg | ORAL_TABLET | Freq: Every evening | ORAL | 0 refills | Status: DC | PRN
Start: 2022-01-28 — End: 2022-02-11

## 2022-01-28 MED ORDER — PROMETHAZINE-DM 6.25 MG-15 MG/5 ML ORAL SYRUP
5.0000 mL | ORAL_SOLUTION | Freq: Four times a day (QID) | ORAL | 0 refills | Status: AC | PRN
Start: 2022-01-28 — End: 2022-02-27

## 2022-01-28 NOTE — Progress Notes (Signed)
INTERNAL MEDICINE, [place]  [address] [id number]    Telephone Visit    Name:  Christine Mcmillan MRN: [id number]   Date:  01/28/2022 Age:   76 y.o.     The patient/family initiated a request for telephone service.  Verbal consent for this service was obtained from the patient/family.    Last office visit in this department: 01/25/2022     TELEMEDICINE DOCUMENTATION:    [facility]  Patient/family aware of provider location:  yes  Patient/family consent for telemedicine:  yes  Examination observed and performed by:  Cleophus Molt, PA-C     Reason for call:  Medication change not sleeping    Call notes:    Patient being seen concerning continued issues with insomnia.  It is noted that she has had issues with sleeping for numerous years previously she had been on Ativan 2 mg nightly as well as trazodone 50 mg nightly and Restoril.  Patient states years back she did try Ambien and several other of the sleeping medication similar without success.  She tells me today that she has gone 6 days without sleeping hardly at all.  She stays tired all the time due to not sleeping.  When questioning total time she would estimate 1 hour nightly if that.  She also has a history of anxiety but does not think it is related.  The Ativan previously had seemed to help but lately it is "doing nothing for her".  She is requesting to change medication to something stronger possible to see if she can get some rest.  It is noted that I have discussed with her numerous times side effects of the medications as well as suggested referral to sleep specialist however previously she did not have insurance to cover and could not afford it.  Patient also has a history of noncompliance.  She has now received Medicare and tells me if I make the referral that she will go for eval.      She has a history of chronic cough noted for which she has become reliant on cough medications.  She  has refused home eval in the past.  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  Patient states that she is unable to stop coughing when she lays down does she have the medication.  She is requesting a refill of that today also.  Currently she has no complaints of fever chills shortness of breath and chronic postnasal drainage is noted however clear in color.    It is noted this patient is very poorly compliant with medication use screening test/preventative testing.    Allergies   Allergen Reactions   . Acetic Acid-Aluminum Acetate Swelling   . Sertraline  Other Adverse Reaction (Add comment)     Headache       Current medication:  amLODIPine (NORVASC) 5 mg Oral Tablet, Take 1 Tablet (5 mg total) by mouth Once a day  furosemide (LASIX) 40 mg Oral Tablet, Take 1 Tablet (40 mg total) by mouth Once per day as needed for Other (Edema)  guaiFENesin 100 mg/5 mL Oral Liquid, Take 10 mL (200 mg total) by mouth Every 6 hours as needed (Patient not taking: Reported on 02/22/2022)  NALOXONE 4 MG/ACTUATION NASAL SPRAY, 1 Spray by INTRANASAL route Every 2 minutes as needed for actual or suspected opioid overdose. Call  911 if used.  omeprazole (PRILOSEC) 20 mg Oral Capsule, Delayed Release(E.C.), Take 1 Capsule (20 mg total) by mouth Once a day for 30 days  potassium chloride (MICRO K) 10 mEq Oral Capsule, Sustained Release, Take 1 Capsule (10 mEq total) by mouth Once a day (Patient not taking: Reported on 01/06/2022)  pravastatin (PRAVACHOL) 80 mg Oral Tablet, Take 1 Tablet (80 mg total) by mouth Every night  celecoxib (CELEBREX) 200 mg Oral Capsule, Take 1 Capsule (200 mg total) by mouth Once a day (Patient not taking: Reported on 01/06/2022)  cetirizine (ZYRTEC) 10 mg Oral Tablet, Take 1 Tablet (10 mg total) by mouth Once a day (Patient not taking: Reported on 01/06/2022)  cyclobenzaprine (FLEXERIL) 10 mg Oral Tablet, Take 1 Tablet (10 mg total) by mouth Three  times a day as needed for Muscle spasms (Patient not taking: Reported on 01/06/2022)  diclofenac sodium (PENNSAID) 2 % Apply externally Solution in Packet, Apply 1 Packet topically Twice per day as needed for Other (Knee pain) (Patient not taking: Reported on 01/06/2022)  ergocalciferol, vitamin D2, (DRISDOL) 1,250 mcg (50,000 unit) Oral Capsule, Take 1 Capsule (50,000 Units total) by mouth Every 7 days  LORazepam (ATIVAN) 1 mg Oral Tablet, Take 2 Tablets (2 mg total) by mouth Every night as needed for Anxiety for up to 30 days  nebivoloL (BYSTOLIC) 10 mg Oral Tablet, Take 1 Tablet (10 mg total) by mouth Once a day  promethazine-codeine (PHENERGAN WITH CODEINE) 6.25-10 mg/5 mL Oral Syrup, Take 5 mL by mouth Four times a day as needed for Cough for up to 10 days    No facility-administered medications prior to visit.      Social History     Socioeconomic History   . Marital status: Unknown   Tobacco Use   . Smoking status: Never   . Smokeless tobacco: Never   Substance and Sexual Activity   . Alcohol use: Never   . Drug use: Never       Physical Exam:    Physical Exam  Constitutional:       General: She is not in acute distress.  Pulmonary:      Effort: Pulmonary effort is normal.      Comments: Pt able to speak in complete sentences    Neurological:      Mental Status: She is alert.   Psychiatric:         Mood and Affect: Mood normal.         Behavior: Behavior normal.         Thought Content: Thought content normal.         Judgment: Judgment normal.           ICD-10-CM    1. Insomnia, unspecified type  G47.00 Referral to External Provider (AMB)      2. Chronic cough  R05.3       3. Chronic fatigue  R53.82       4. Patient noncompliance, general  Z91.199         Will DC Ativan   Will try Valium 5 mg nightly  Encouraged relaxation methods light therapy avoidance of caffeine once again  Rx for Promethazine DM p.r.n. cough given  To continue over-the-counter allergy medicine daily  Referred to pulmonologist for sleep  studies  Will follow-up otherwise as scheduled 2 weeks    Total provider time spent with the patient on the phone: 18 minutes.    Zakariyah Freimark, PA-C

## 2022-01-28 NOTE — Telephone Encounter (Signed)
Renal ultrasound was ordered.

## 2022-02-08 ENCOUNTER — Other Ambulatory Visit (RURAL_HEALTH_CENTER): Payer: Self-pay | Admitting: Physician Assistant

## 2022-02-08 DIAGNOSIS — N289 Disorder of kidney and ureter, unspecified: Secondary | ICD-10-CM

## 2022-02-10 ENCOUNTER — Telehealth (RURAL_HEALTH_CENTER): Payer: Self-pay | Admitting: Physician Assistant

## 2022-02-10 NOTE — Telephone Encounter (Signed)
Patient called and stated that she was prescribed Valium on 4/6, but that she was only given 14 tablets, so she is going to be running out. Patient states that she either needs a refill on the Valium or if you want her on the Restoril 30 mg at night that would be fine also. The prescription needs to go to [facility] and her next appointment is on May 1

## 2022-02-11 ENCOUNTER — Other Ambulatory Visit (RURAL_HEALTH_CENTER): Payer: Self-pay | Admitting: Physician Assistant

## 2022-02-11 NOTE — Telephone Encounter (Signed)
Prescription sent to [facility] for approval.

## 2022-02-11 NOTE — Telephone Encounter (Signed)
Yes she said the Valium was working, she just said if you did not want to call in the Valium then she would like to have the Restoril back, but she would prefer to have another refill of the valium.

## 2022-02-12 ENCOUNTER — Other Ambulatory Visit (RURAL_HEALTH_CENTER): Payer: Self-pay | Admitting: Physician Assistant

## 2022-02-12 MED ORDER — DIAZEPAM 5 MG TABLET
5.0000 mg | ORAL_TABLET | Freq: Every evening | ORAL | 0 refills | Status: DC | PRN
Start: 2022-02-12 — End: 2022-02-22

## 2022-02-12 NOTE — Telephone Encounter (Signed)
RX approved and encounter closed

## 2022-02-21 DIAGNOSIS — N289 Disorder of kidney and ureter, unspecified: Secondary | ICD-10-CM | POA: Insufficient documentation

## 2022-02-22 ENCOUNTER — Encounter (RURAL_HEALTH_CENTER): Payer: Self-pay | Admitting: Physician Assistant

## 2022-02-22 ENCOUNTER — Other Ambulatory Visit: Payer: Self-pay

## 2022-02-22 ENCOUNTER — Ambulatory Visit: Payer: Medicare Other | Attending: Physician Assistant | Admitting: Physician Assistant

## 2022-02-22 ENCOUNTER — Ambulatory Visit (RURAL_HEALTH_CENTER): Payer: Medicare Other | Attending: Physician Assistant | Admitting: Physician Assistant

## 2022-02-22 ENCOUNTER — Ambulatory Visit (RURAL_HEALTH_CENTER): Payer: Medicare Other | Admitting: Physician Assistant

## 2022-02-22 VITALS — BP 126/80 | HR 52 | Temp 98.0°F | Ht 60.0 in | Wt 169.0 lb

## 2022-02-22 DIAGNOSIS — G8929 Other chronic pain: Secondary | ICD-10-CM | POA: Insufficient documentation

## 2022-02-22 DIAGNOSIS — R252 Cramp and spasm: Secondary | ICD-10-CM | POA: Insufficient documentation

## 2022-02-22 DIAGNOSIS — M2550 Pain in unspecified joint: Secondary | ICD-10-CM | POA: Insufficient documentation

## 2022-02-22 DIAGNOSIS — I10 Essential (primary) hypertension: Secondary | ICD-10-CM | POA: Insufficient documentation

## 2022-02-22 DIAGNOSIS — E785 Hyperlipidemia, unspecified: Secondary | ICD-10-CM

## 2022-02-22 DIAGNOSIS — N289 Disorder of kidney and ureter, unspecified: Secondary | ICD-10-CM | POA: Insufficient documentation

## 2022-02-22 DIAGNOSIS — M545 Low back pain, unspecified: Secondary | ICD-10-CM

## 2022-02-22 DIAGNOSIS — M549 Dorsalgia, unspecified: Secondary | ICD-10-CM | POA: Insufficient documentation

## 2022-02-22 DIAGNOSIS — G4700 Insomnia, unspecified: Secondary | ICD-10-CM | POA: Insufficient documentation

## 2022-02-22 DIAGNOSIS — Z0100 Encounter for examination of eyes and vision without abnormal findings: Secondary | ICD-10-CM

## 2022-02-22 MED ORDER — NEBIVOLOL 10 MG TABLET
10.0000 mg | ORAL_TABLET | Freq: Every day | ORAL | 1 refills | Status: DC
Start: 2022-02-22 — End: 2022-07-22

## 2022-02-22 MED ORDER — ACETAMINOPHEN 300 MG-CODEINE 30 MG TABLET
1.0000 | ORAL_TABLET | Freq: Four times a day (QID) | ORAL | 0 refills | Status: AC | PRN
Start: 2022-02-22 — End: 2022-03-01

## 2022-02-22 MED ORDER — ERGOCALCIFEROL (VITAMIN D2) 1,250 MCG (50,000 UNIT) CAPSULE
50000.0000 [IU] | ORAL_CAPSULE | ORAL | 2 refills | Status: DC
Start: 2022-02-22 — End: 2022-07-22

## 2022-02-22 MED ORDER — ALPRAZOLAM 0.5 MG TABLET
ORAL_TABLET | ORAL | 0 refills | Status: DC
Start: 2022-02-22 — End: 2022-04-01

## 2022-02-22 NOTE — Progress Notes (Signed)
INTERNAL MEDICINE, [place]  [address] [id number]  Operated by [facility]  History and Physical     Name: Christine Mcmillan MRN:  [id number]   Date: 02/22/2022 Age: 76 y.o.               Follow Up        Reason for Visit: Follow Up (Routine )    History of Present Illness  Christine Mcmillan is a 76 y.o. female who is being seen today in office for follow-up however several issues noted today    Patient reports that she went back to the ER in [place] April 17 for acute lower back pain as well as knee pain.  A CT scan was obtained with findings noted :    Merilyn Baba, DO - 02/08/2022   Formatting of this note might be different from the original.  EXAM:  CT Lumbar Spine Without Intravenous Contrast.     CLINICAL HISTORY:  (Prior 04/20/2019. Chronic bilateral knee and low back pain. Negative for trauma) Low back pain, constant or radicular pain, positive xray (Ped 0-17y)    TECHNIQUE:  Axial computed tomography images of the lumbar spine without intravenous contrast. Sagittal and coronal reformations performed.     COMPARISON:  Radiographs dated 04/20/2019    FINDINGS:    BONES:  There is moderate compression deformity of T12 which appears new from 2020 but is of indeterminate age. No additional compression fracture suspected. Vertebral body heights are otherwise maintained.  There is grade I degenerative anterior listhesis of L4 on L5 which is similar to the prior study. There is moderate dextrocurvature of the lumbar spine.    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 Disc space narrowing is most pronounced at L1/L2 and L2/L3 at the apex of the curvature.  At L4/L5 there is advanced facet degenerative change with some thickening/laxity of the ligamentum flavum and there is degenerative disc disease with minimal grade I anterolisthesis of L4 and L5. There is  some broad-based posterior disc protrusion eccentric to the right with moderate disc material protruding into the right neural foramen. There is moderate right neural foramen narrowing at this level. There is moderate central narrowing.  At L3/L4 there is advanced facet degenerative change and degenerative disc disease with moderate central and foraminal narrowing.  At L2/L3 there is advanced degenerative disc changes and moderate facet arthropathy with left lateral and to a lesser extent right lateral disc protrusion resulting in bilateral foraminal narrowing.  At L1/L2 there is degenerative disc disease and mild facet arthropathy without significant stenosis.    SOFT TISSUES:  Layering high density in the gallbladder may represent sludge or stones. Diverticulosis.    IMPRESSION:    1. There is compression deformity of T12 new from 2020 but of indeterminate age. No retropulsion.  2. There is multilevel degenerative change throughout the lumbar spine with multiple levels of central and foraminal narrowing. Central narrowing appears most severe at L4/L5 and foraminal narrowing appears most severe on the right at the same level.  3. Cholelithiasis    Patient questioning what needs to be done regarding finding.  He complains still of mid lower back pain with occasional radiation into bilateral legs but denies numbness tingling.  Patient states that she received a Toradol shot in the arm and was given Percocet to take home.  By the following a.m. her range of  motion was significantly improved and she is not sure if it with the Toradol or the Percocet but is requesting a refill of Percocet for pain medicine.  Patient has used tramadol in the past she states it made her hallucinate.  She does have a history of chronic arthritis but is not sure she has ever been worked up for rheumatoid or lupus.  Patient this time does not want to see an orthopedic concerning her knees.  She has also been complaining of leg cramps follow  lateral worse at night history of blood potassium in the past noted.  It was brought to the attention of the patient results of cholelithiasis on CT scan but patient states she has no signs or symptoms of gallbladder disease that this time wants nothing further done.    She still complains of insomnia upon last visit noted Valium was given but she states it did not help her rest at all.  Patient has been through numerous other medications including Restoril trazodone Ambien Ativan all of which she had side effects with the medication did not work.  Patient states she was reading and fell that Xanax was another medication she could try and she is pretty sure she has never tried it so would like to switch the Valium to the Xanax to see if that will help her rest.  Patient tells me that she goes days even weeks without sleeping even 30 minutes to an hour nightly.  She has been referred to the pulmonologist for sleep study and will bed May 25th.    Follow-up blood pressure/hypertension with reading today 126/80.  Patient does not monitor routinely outpatient but  denies any chest pain headaches dizziness.  Continues to take her Norvasc along with Bystolic.  Requesting refill Bystolic    Follow-up abnormal renal function patient was scheduled to do renal ultrasound May 17th.  She has also been given order to do a 24 hour urinary protein.  She is currently not following with nephrologist but denies any urinary complaints.    F/U chol/lipids w pt currently on pravastatin 80mg  q hs with medicine recently increased after last LDL cholesterol levels or pop.  At this time denies side effects or issues to medication .  Follow-up labs needed       COVID vaccine x2 up-to-date  Flu vaccine refused.    PHQ Questionnaire  Little interest or pleasure in doing things.: Not at all  Feeling down, depressed, or hopeless: Not at all  PHQ 2 Total: 0  Trouble falling or staying asleep, or sleeping too much.: Not at all  Feeling tired or  having little energy: Not at all  Poor appetite or overeating: Not at all  Feeling bad about yourself/ that you are a failure in the past 2 weeks?: Not at all  Trouble concentrating on things in the past 2 weeks?: Not at all  Moving/Speaking slowly or being fidgety or restless  in the past 2 weeks?: Not at all  Thoughts that you would be better off DEAD, or of hurting yourself in some way.: Not at all  PHQ 9 Total: 0  Interpretation of Total Score: 0-4 No depression        Past Medical History:   Diagnosis Date   . Abnormal renal function    . Anxiety    . Arthralgia    . Chronic hypokalemia    . COVID-19 vaccine series completed    . Depression    . Esophageal reflux    .  History of memory loss    . History of recurrent UTIs    . Hypertension          Past Surgical History:   Procedure Laterality Date   . BRAIN TUMOR EXCISION     . HX LIPOMA RESECTION N/A     Lipoma on back      Family Medical History:     Problem Relation (Age of Onset)    Cancer Mother          Social History     Tobacco Use   . Smoking status: Never   . Smokeless tobacco: Never   Substance Use Topics   . Alcohol use: Never   . Drug use: Never     Medication:  amLODIPine (NORVASC) 5 mg Oral Tablet, Take 1 Tablet (5 mg total) by mouth Once a day  furosemide (LASIX) 40 mg Oral Tablet, Take 1 Tablet (40 mg total) by mouth Once per day as needed for Other (Edema)  guaiFENesin 100 mg/5 mL Oral Liquid, Take 10 mL (200 mg total) by mouth Every 6 hours as needed (Patient not taking: Reported on 02/22/2022)  NALOXONE 4 MG/ACTUATION NASAL SPRAY, 1 Spray by INTRANASAL route Every 2 minutes as needed for actual or suspected opioid overdose. Call 911 if used.  omeprazole (PRILOSEC) 20 mg Oral Capsule, Delayed Release(E.C.), Take 1 Capsule (20 mg total) by mouth Once a day for 30 days  potassium chloride (MICRO K) 10 mEq Oral Capsule, Sustained Release, Take 1 Capsule (10 mEq total) by mouth Once a day (Patient not taking: Reported on 01/06/2022)  pravastatin  (PRAVACHOL) 80 mg Oral Tablet, Take 1 Tablet (80 mg total) by mouth Every night  promethazine-dextromethorphan (PHENERGAN-DM) 6.25-15 mg/5 mL Oral Syrup, Take 5 mL by mouth Four times a day as needed for Cough for up to 30 days (Patient not taking: Reported on 02/22/2022)  celecoxib (CELEBREX) 200 mg Oral Capsule, Take 1 Capsule (200 mg total) by mouth Once a day (Patient not taking: Reported on 01/06/2022)  cetirizine (ZYRTEC) 10 mg Oral Tablet, Take 1 Tablet (10 mg total) by mouth Once a day (Patient not taking: Reported on 01/06/2022)  cyclobenzaprine (FLEXERIL) 10 mg Oral Tablet, Take 1 Tablet (10 mg total) by mouth Three times a day as needed for Muscle spasms (Patient not taking: Reported on 01/06/2022)  diazePAM (VALIUM) 5 mg Oral Tablet, Take 1 Tablet (5 mg total) by mouth Every night as needed for Anxiety (Patient not taking: Reported on 02/22/2022)  diclofenac sodium (PENNSAID) 2 % Apply externally Solution in Packet, Apply 1 Packet topically Twice per day as needed for Other (Knee pain) (Patient not taking: Reported on 01/06/2022)  ergocalciferol, vitamin D2, (DRISDOL) 1,250 mcg (50,000 unit) Oral Capsule, Take 1 Capsule (50,000 Units total) by mouth Every 7 days  nebivoloL (BYSTOLIC) 10 mg Oral Tablet, Take 1 Tablet (10 mg total) by mouth Once a day    No facility-administered medications prior to visit.    Allergies:  Allergies   Allergen Reactions   . Acetic Acid-Aluminum Acetate Swelling   . Sertraline  Other Adverse Reaction (Add comment)     Headache         Physical Exam:  Vitals:    02/22/22 1406   BP: 126/80   Pulse: 52   Temp: 36.7 C (98 F)   TempSrc: Tympanic   SpO2: 97%   Weight: 76.7 kg (169 lb)   Height: 1.524 m (5')   BMI: 33.07  Physical Exam  Vitals and nursing note reviewed.   Constitutional:       Appearance: Normal appearance.      Comments: overwt   HENT:      Right Ear: Tympanic membrane normal.      Left Ear: Tympanic membrane normal.      Mouth/Throat:      Mouth: Mucous membranes  are moist.      Pharynx: Oropharynx is clear.   Eyes:      Pupils: Pupils are equal, round, and reactive to light.   Cardiovascular:      Rate and Rhythm: Normal rate and regular rhythm.      Pulses: Normal pulses.   Pulmonary:      Effort: Pulmonary effort is normal.      Breath sounds: Normal breath sounds.     Abdominal:      General: Bowel sounds are normal.      Palpations: Abdomen is soft.      Tenderness: There is no abdominal tenderness. There is no right CVA tenderness or left CVA tenderness.   Musculoskeletal:         General: No swelling or tenderness. Normal range of motion.      Cervical back: Normal range of motion and neck supple.      Comments: Arthritic changes arthritic changes hands and knees noted   Range of motion lower back intact  Negative straight leg raises bilateral  Skin:     General: Skin is warm.      Findings: No lesion or rash.   Neurological:      General: No focal deficit present.      Mental Status: She is alert and oriented to person, place, and time.   Psychiatric:         Mood and Affect: Mood normal.         Behavior: Behavior normal.         Thought Content: Thought content normal.         Judgment: Judgment normal.     Assessment/Plan:  Problem List Items Addressed This Visit        Cardiovascular System    Essential hypertension    Relevant Orders    COMPREHENSIVE METABOLIC PANEL, NON-FASTING    Referral to External Provider (AMB)       Neurologic    Insomnia       Other    Abnormal renal function    Relevant Orders    COMPREHENSIVE METABOLIC PANEL, NON-FASTING   Other Visit Diagnoses     Low back pain, unspecified back pain laterality, unspecified chronicity, unspecified whether sciatica present    -  Primary    Relevant Orders    MRI SPINE LUMBOSACRAL WO CONTRAST    Arthralgia, unspecified joint        Relevant Orders    HEP-2 SUBSTRATE ANTINUCLEAR ANTIBODIES (ANA), SERUM    RHEUMATOID FACTOR, SERUM    SEDIMENTATION RATE    Leg cramps        Relevant Orders    MAGNESIUM     Hyperlipidemia, unspecified hyperlipidemia type        Relevant Orders    LIPID PANEL    Eye exam, routine        Relevant Orders    Referral to External Provider (AMB)         Notes from ER visit reviewed in detail  Labs obtained in office  Will get MRI of lumbar spine  Tylenol 3 q.6  hours p.r.n. Pain given  Once again patient refused referral to orthopedic  DC Valium  Xanax 0.5 mg 1-2 q.h.s..  Given  Continue follow-up with pulmonologist for sleep study  Continue follow-up with GI for scheduled EGD  Will get ultrasound of kidneys May 17 for  F/u pending lab/MRI results otherwise as scheduled      Follow up: Return in about 3 months (around 05/25/2022) for In Person Visit.  Seek medical attention for new or worsening symptoms.  Patient has been seen in this clinic within the last 3 years.     Alexandrya Chim, PA-C          This note was partially created using MModal Fluency Direct system (voice recognition software) and is inherently subject to errors including those of syntax and "sound-alike" substitutions which may escape proofreading.  In such instances, original meaning may be extrapolated by contextual derivation.

## 2022-02-22 NOTE — Nursing Note (Signed)
Patient is here for routine follow up to discuss several medications.

## 2022-02-23 ENCOUNTER — Ambulatory Visit (RURAL_HEALTH_CENTER): Payer: Medicare Other | Admitting: Physician Assistant

## 2022-02-23 LAB — COMPREHENSIVE METABOLIC PANEL, NON-FASTING
ALBUMIN/GLOBULIN RATIO: 1.5 — ABNORMAL HIGH (ref 0.8–1.4)
ALBUMIN: 4.6 g/dL (ref 3.5–5.7)
ALKALINE PHOSPHATASE: 67 U/L (ref 34–104)
ALT (SGPT): 32 U/L (ref 7–52)
ANION GAP: 7 mmol/L — ABNORMAL LOW (ref 10–20)
AST (SGOT): 37 U/L (ref 13–39)
BILIRUBIN TOTAL: 0.7 mg/dL (ref 0.3–1.2)
BUN/CREA RATIO: 16 (ref 6–22)
BUN: 15 mg/dL (ref 7–25)
CALCIUM, CORRECTED: 9 mg/dL (ref 8.9–10.8)
CALCIUM: 9.6 mg/dL (ref 8.6–10.3)
CHLORIDE: 104 mmol/L (ref 98–107)
CO2 TOTAL: 28 mmol/L (ref 21–31)
CREATININE: 0.96 mg/dL (ref 0.60–1.30)
ESTIMATED GFR: 61 mL/min/{1.73_m2} (ref >59)
GLOBULIN: 3.1 (ref 2.9–5.4)
GLUCOSE: 71 mg/dL — ABNORMAL LOW (ref 74–109)
OSMOLALITY, CALCULATED: 277 mOsm/kg (ref 270–290)
POTASSIUM: 4.7 mmol/L (ref 3.5–5.1)
PROTEIN TOTAL: 7.7 g/dL (ref 6.4–8.9)
SODIUM: 139 mmol/L (ref 136–145)

## 2022-02-23 LAB — MAGNESIUM: MAGNESIUM: 2 mg/dL (ref 1.9–2.7)

## 2022-02-23 LAB — LIPID PANEL
CHOL/HDL RATIO: 4.7
CHOLESTEROL: 268 mg/dL — ABNORMAL HIGH (ref <200)
HDL CHOL: 57 mg/dL (ref 23–92)
LDL CALC: 177 mg/dL — ABNORMAL HIGH (ref 0–100)
TRIGLYCERIDES: 169 mg/dL — ABNORMAL HIGH (ref <=150)
VLDL CALC: 34 mg/dL (ref 0–50)

## 2022-02-23 LAB — SEDIMENTATION RATE: ERYTHROCYTE SEDIMENTATION RATE (ESR): 22 mm/hr (ref <=30)

## 2022-02-24 ENCOUNTER — Other Ambulatory Visit (RURAL_HEALTH_CENTER): Payer: Self-pay | Admitting: Physician Assistant

## 2022-02-24 LAB — RHEUMATOID FACTOR, SERUM: RHEUMATOID FACTOR: <13 IU/mL (ref <=30)

## 2022-02-24 LAB — HEP-2 SUBSTRATE ANTINUCLEAR ANTIBODIES (ANA), SERUM: ANA INTERPRETATION: NEGATIVE

## 2022-02-24 MED ORDER — EZETIMIBE 10 MG TABLET
10.0000 mg | ORAL_TABLET | Freq: Every evening | ORAL | 1 refills | Status: DC
Start: 2022-02-24 — End: 2023-02-02

## 2022-03-03 ENCOUNTER — Other Ambulatory Visit (RURAL_HEALTH_CENTER): Payer: Self-pay | Admitting: Physician Assistant

## 2022-03-03 DIAGNOSIS — M545 Low back pain, unspecified: Secondary | ICD-10-CM

## 2022-03-09 ENCOUNTER — Other Ambulatory Visit (RURAL_HEALTH_CENTER): Payer: Self-pay | Admitting: Physician Assistant

## 2022-03-09 NOTE — Nursing Note (Signed)
Pt called needed her Xanax and her Tylenol 3 refilled and sent into [facility]

## 2022-03-10 ENCOUNTER — Ambulatory Visit (HOSPITAL_COMMUNITY): Payer: Self-pay

## 2022-03-11 ENCOUNTER — Ambulatory Visit (HOSPITAL_COMMUNITY): Payer: Self-pay

## 2022-03-19 ENCOUNTER — Telehealth (RURAL_HEALTH_CENTER): Payer: Self-pay | Admitting: Physician Assistant

## 2022-03-19 DIAGNOSIS — R5382 Chronic fatigue, unspecified: Secondary | ICD-10-CM | POA: Insufficient documentation

## 2022-03-23 ENCOUNTER — Telehealth (RURAL_HEALTH_CENTER): Payer: Self-pay | Admitting: Physician Assistant

## 2022-03-24 ENCOUNTER — Ambulatory Visit (RURAL_HEALTH_CENTER): Payer: Medicare Other | Admitting: Physician Assistant

## 2022-03-31 ENCOUNTER — Encounter (RURAL_HEALTH_CENTER): Payer: Self-pay | Admitting: Physician Assistant

## 2022-03-31 ENCOUNTER — Other Ambulatory Visit: Payer: Self-pay

## 2022-03-31 ENCOUNTER — Ambulatory Visit (RURAL_HEALTH_CENTER): Payer: Medicare Other | Admitting: Physician Assistant

## 2022-04-01 ENCOUNTER — Ambulatory Visit (RURAL_HEALTH_CENTER): Payer: Medicare Other | Attending: Physician Assistant | Admitting: Physician Assistant

## 2022-04-01 ENCOUNTER — Encounter (RURAL_HEALTH_CENTER): Payer: Self-pay | Admitting: Physician Assistant

## 2022-04-01 VITALS — BP 150/70

## 2022-04-01 DIAGNOSIS — Z79899 Other long term (current) drug therapy: Secondary | ICD-10-CM | POA: Insufficient documentation

## 2022-04-01 DIAGNOSIS — I10 Essential (primary) hypertension: Secondary | ICD-10-CM | POA: Insufficient documentation

## 2022-04-01 DIAGNOSIS — G510 Bell's palsy: Secondary | ICD-10-CM | POA: Insufficient documentation

## 2022-04-01 DIAGNOSIS — R29810 Facial weakness: Secondary | ICD-10-CM

## 2022-04-01 DIAGNOSIS — Z91199 Patient's noncompliance with other medical treatment and regimen due to unspecified reason: Secondary | ICD-10-CM | POA: Insufficient documentation

## 2022-04-01 DIAGNOSIS — G4700 Insomnia, unspecified: Secondary | ICD-10-CM | POA: Insufficient documentation

## 2022-04-01 MED ORDER — LORAZEPAM 2 MG TABLET
2.0000 mg | ORAL_TABLET | Freq: Every evening | ORAL | 0 refills | Status: DC | PRN
Start: 2022-04-01 — End: 2022-04-29

## 2022-04-01 NOTE — Progress Notes (Signed)
INTERNAL MEDICINE, [facility] INTERNAL MEDICINE [facility]  [address]  [place] [address]    Telephone Visit    Name:  Christine Mcmillan MRN: [id number]   Date:  04/01/2022 Age:   76 y.o.     The patient/family initiated a request for telephone service.  Verbal consent for this service was obtained from the patient/family.    TELEMEDICINE DOCUMENTATION:    [facility]  Patient/family aware of provider location:  yes  Patient/family consent for telemedicine:  yes  Examination observed and performed by:  Eyvonne Mechanic, PA-C     Reason for call:  Follow-up ER visit; still having trouble sleeping    Call notes:  Christine Mcmillan is a 76 y.o. female who is being seen today via TeleMed for follow-up after being seen in the emergency room 03/28/2022.  Patient states she presented to the emergency room for complaints of a headache as well as some facial drooping on the right side.  Initially suspicious for CVA however CT scans were unremarkable thus leading treatment for Bell's palsy.  Patient states that she was given prednisone as well as another medication she is currently at the pharmacy while we are talking to pick up.  Patient states that she is having some trouble talking clearly because of the facial droop but denies current headache dizziness or extremity weakness.  Patient states that her symptoms started  Last Wednesday.  Overall she is feeling better other than continued facial droop.    She is also been monitoring her blood pressure with noted history of hypertension.  States it is running around 150/76 but prior to taking her medication.  During the ER visit blood pressure was recorded at 176/74 163/73 however patient has not had her meds.  She denies any chest pain shortness of breath.  She does continue to take her Bystolic as well as the Norvasc.    She still complains also of continued insomnia with a history of chronic sleeping issues noted.  I did refer her to a pulmonologist  and she states she went to Dr. Felton Clinton who pretty much did nothing for her and told her to come back in 30 days for recheck.  She was encouraged to cut out all caffeine.  Patient states she is still not sleeping at all even with the Xanax would like to go back to the Ativan 2 mg nightly for which she requests a prescription.  Patient was not sure she would return for follow-up appointment but strongly encouraged today that she continue because of her chronic issues and need for further eval.    Once again history of abnormal renal function patient was scheduled to do renal ultrasound May 17th.  As well as 24 hour urinary protein however has yet to complete.  She is currently not following with nephrologist but denies any urinary complaints.  Patient is poorly compliant with requested testing in general compliance.    Current Meds:    . amLODIPine (NORVASC) 5 mg Oral Tablet Take 1 Tablet (5 mg total) by mouth Once a day   . ergocalciferol, vitamin D2, (DRISDOL) 1,250 mcg (50,000 unit) Oral Capsule Take 1 Capsule (50,000 Units total) by mouth Every 7 days   . ezetimibe (ZETIA) 10 mg Oral Tablet Take 1 Tablet (10 mg total) by mouth Every evening   . furosemide (LASIX) 40 mg Oral Tablet Take 1 Tablet (40 mg total) by mouth Once per day as needed for Other (Edema)   .  guaiFENesin 100 mg/5 mL Oral Liquid Take 10 mL (200 mg total) by mouth Every 6 hours as needed (Patient not taking: Reported on 02/22/2022)   . LORazepam (ATIVAN) 2 mg Oral Tablet Take 1 Tablet (2 mg total) by mouth Every night as needed for Anxiety   . NALOXONE 4 MG/ACTUATION NASAL SPRAY 1 Spray by INTRANASAL route Every 2 minutes as needed for actual or suspected opioid overdose. Call 911 if used.   . nebivoloL (BYSTOLIC) 10 mg Oral Tablet Take 1 Tablet (10 mg total) by mouth Once a day   . potassium chloride (MICRO K) 10 mEq Oral Capsule, Sustained Release Take 1 Capsule (10 mEq total) by mouth Once a day (Patient not taking: Reported on 01/06/2022)   .  pravastatin (PRAVACHOL) 80 mg Oral Tablet Take 1 Tablet (80 mg total) by mouth Every night        Allergies   Allergen Reactions   . Acetic Acid-Aluminum Acetate Swelling   . Sertraline  Other Adverse Reaction (Add comment)     Headache          Past Medical History:   Diagnosis Date   . Abnormal renal function    . Anxiety    . Arthralgia    . Bell's palsy    . Chronic hypokalemia    . COVID-19 vaccine series completed    . Depression    . Esophageal reflux    . History of memory loss    . History of recurrent UTIs    . Hypertension         Social History     Socioeconomic History   . Marital status: Unknown   Tobacco Use   . Smoking status: Never   . Smokeless tobacco: Never   Substance and Sexual Activity   . Alcohol use: Never   . Drug use: Never        Past Surgical History:   Procedure Laterality Date   . BRAIN TUMOR EXCISION     . HX LIPOMA RESECTION N/A     Lipoma on back        Family Medical History:     Problem Relation (Age of Onset)    Cancer Mother             Physical Exam:    Physical Exam  Constitutional:       General: She is not in acute distress.  Pulmonary:      Effort: Pulmonary effort is normal.      Comments: Pt able to speak in complete sentences    Neurological:      Mental Status: She is alert.   Psychiatric:         Mood and Affect: Mood normal.         Behavior: Behavior normal.         Thought Content: Thought content normal.         Judgment: Judgment normal.       Assessment and plan:    ICD-10-CM    1. Bell's palsy  G51.0       2. Facial droop  R29.810       3. Essential hypertension  I10       4. Insomnia, unspecified type  G47.00       5. Patient noncompliance, general  Z91.199           Plan    Patient is to complete entire course of prednisone as given  Will DC Xanax  Restart Ativan 2 mg nightly p.r.n.  Patient strongly encouraged to follow-up with pulmonologist as scheduled  Any acute issues or changes symptoms return to ER  Follow-up as scheduled in July  Total provider time  spent with the patient on the phone: 28 minutes.    Floree Zuniga, PA-C

## 2022-04-01 NOTE — Nursing Note (Signed)
Patient is doing a tele med today for routine 1 month follow up for medication refills. Patient states that she went to [facility] for headaches and stroke symptoms and they diagnosed her with Bell's Palsy.

## 2022-04-09 NOTE — ED Provider Notes (Signed)
 ED Provider Note    Subjective     Christine Mcmillan is a 76 y.o. female who presents to ED with chief complaint of HA. Pt with hx of Depression, HTN. Comes to the ED today from home. States she was Dx with Bells Palsy 1 week ago. States the has been persistent since. States the HA to be located over right occipital region. She denies any injury, fever, nausea, vomiting. States she has been taking Fioricet for pain with no improvement. States she has been treated here in the ED for the HA on 2 occcasions since onset and the CT-Scan's were negative.               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      Social History    Socioeconomic History      Marital status: Divorced    Tobacco Use      Smoking status: Never      Smokeless tobacco: Never    Vaping Use      Vaping status: Never Used    Substance and Sexual Activity      Alcohol use: No      Drug use: No      Sexual activity: Not Currently      No current facility-administered medications for this encounter.        History provided by:  Medical records and patient  Language interpreter used: No        Objective     Vitals <redacted file path>:  ED Triage Vitals [04/09/22 1714]   BP Pulse Resp Temp SpO2 Flow (L/min)   (!) 169/92 88 22 98.5 F (36.9 C) 96 % --     Patient Vitals for the past 24 hrs:   BP Temp Pulse Resp SpO2 Height Weight   04/09/22 1824 126/76 -- 75 18 98 % -- --   04/09/22 1714 (!) 169/92 98.5 F (36.9 C) 88 22 96 % 1.524 m (5') 74.8 kg (165 lb)       Physical Exam  Vitals and nursing note reviewed.   Constitutional:       General: She is not in acute distress.     Appearance: Normal appearance. She is well-developed. She is not ill-appearing, toxic-appearing or diaphoretic.   HENT:      Head: Normocephalic and atraumatic. No  abrasion, contusion or laceration.      Right Ear: External ear normal.      Left Ear: External ear normal.   Eyes:      General: Lids are normal. No scleral icterus.     Conjunctiva/sclera: Conjunctivae normal.      Pupils: Pupils are equal, round, and reactive to light.   Neck:      Trachea: Trachea and phonation normal.   Cardiovascular:      Rate and Rhythm: Normal rate and regular rhythm.      Pulses: Normal pulses.      Heart sounds: Normal heart sounds. No murmur heard.     No friction rub. No gallop.   Pulmonary:      Effort: Pulmonary effort is normal. No respiratory distress.      Breath sounds: Normal breath sounds. No stridor. No wheezing, rhonchi or rales.  Abdominal:      General: Bowel sounds are normal. There is no distension.      Palpations: Abdomen is soft. There is no mass.      Tenderness: There is no abdominal tenderness. There is no guarding or rebound.      Hernia: No hernia is present.   Musculoskeletal:      Cervical back: Full passive range of motion without pain, normal range of motion and neck supple.      Right lower leg: No edema.      Left lower leg: No edema.   Skin:     General: Skin is warm and dry.      Capillary Refill: Capillary refill takes less than 2 seconds.      Coloration: Skin is not pale.      Findings: No erythema or rash.   Neurological:      General: No focal deficit present.      Mental Status: She is alert and oriented to person, place, and time.      Cranial Nerves: No cranial nerve deficit.      Sensory: No sensory deficit.      Motor: No weakness.      Coordination: Coordination normal.      Gait: Gait normal.      Deep Tendon Reflexes: Reflexes normal.      Comments: Right facial droop, right eye lid droop, not able to close right eye, consistent with Bells Palsy  No acute focal deficits   Psychiatric:         Mood and Affect: Mood normal.         Behavior: Behavior normal.         Thought Content: Thought content normal.         Judgment: Judgment normal.            Results for orders placed or performed during the hospital encounter of 04/09/22   CBC WITH AUTO DIFF (CBCD)   Result Value Ref Range    WBC 12.1 (H) 4.0 - 10.5 K/uL    RBC 5.19 (H) 4.1 - 5.1 M/uL    Hemoglobin 14.7 12.0 - 16.0 g/dL    Hematocrit 16.1 36 - 46 %    MCV 87.7 78 - 98 fL    MCH 28.3 27 - 34.6 pg    MCHC 32.3 (L) 33 - 37 g/dL    RDW 09.6 04.5 - 40.9 %    Platelet Count 255 130 - 400 K/uL    MPV 10.2 9.4 - 12.4 fL    Seg 65.3 %    Lymph 25.2 %    Monos 5.9 %    Eos 3.0 %    Baso 0.6 %    Absolute Neut 7.9 (H) 1.8 - 7.7 K/uL    Absolute Lymph 3.1 1.0 - 5.0 K/uL    Absolute Mono 0.7 0 - 0.8 K/uL    Absolute Eos 0.4 0 - 0.4 K/uL    Absolute Basophils 0.1 0 - 0.2 K/uL     Comment: Lab studies performed by: [facility] located at [facility], [address]  [place], [place] [address]     COMPREHENSIVE METABOLIC PANEL(COMP)   Result Value Ref Range    Sodium 137 136 - 148 MMOL/L    Potassium 3.5 3.5 - 5.2 MMOL/L    Chloride 99 98 - 108 MMOL/L    CO2 28 20 - 32 MMOL/L    Urea Nitrogen 20 7 - 23  MG/DL    Creatinine 1.61 (H) 0.51 - 1.02 MG/DL     Comment: Creatinine results have been standardized to a reference material that is  traceable to IDMS method.      Glucose, Bld 134 (H) 74 - 106 mg/dL     Comment: Non fasting glucose range:  65-140 mg/dl  Impaired fasting Glucose:  100-125 mg/dL      Total Protein 6.6 5.7 - 8.2 G/DL    Albumin 3.2 3.2 - 5.0 G/DL    Calcium 8.7 8.5 - 09.6 MG/DL    Total Bilirubin 0.3 0.2 - 1.1 MG/DL    Alkaline Phosphatase, Serum 72 46 - 116 U/L    AST 23 15 - 37 U/L    ALT 28 14 - 59 U/L    Globulin 3.4 1.7 - 3.9 G/DL    Albumin/Globulin 0.9 0.7 - 2.3 RATIO    Anion Gap 10 8 - 18 mmol/L    Osmolality Calc 289 278 - 305 MOS/KG     Comment: Calculated Osmolality formula has been changed to the Smithline-Gardner  Formula: [2(NA)+GLU/18+BUN/2.8]. Please note change in value from the previous  calculated results.      Bun/Creatinine 13.2 7 - 20    Glom Filt Rate, Estimated 34 (L) >60  ml/min/1.46m2     Comment: (Note)  The estimated GFR is a calculation valid for adults (18 to 77 years  old) that uses the CKD-EPI algorithm to adjust for age and sex. It is  not to be used for children, pregnant women, hospitalized patients,  patients on dialysis, or with rapidly changing kidney function.  According to the NKDEP, eGFR >89 is normal, 60-89 shows mild  impairment, 30-59 shows moderate impairment, 15-29 shows severe  impairment and <15 is ESRD.    Lab studies performed by: [facility] located at [facility], [address] [place], [place] [address]     MAGNESIUM(MG)   Result Value Ref Range    Magnesium 1.8 1.6 - 2.6 MG/DL     Comment: Lab studies performed by: [facility] located at [facility], [address]  [place], [place] [address]            Assessment and Plan:          MDM "Risk" in the MDM section refers to billing criteria on potential for complications and/or morbidity/mortality of management as defined by the AMA and CMS    ED Course <redacted file path>:   5:21 PM Pt with chief complaint of HA. Pt with hx of Depression, HTN. Comes to the ED today from home. States she was Dx with Bells Palsy 1 week ago. States the has been persistent since. States the HA to be located over right occipital region. She denies any injury, fever, nausea, vomiting. States she has been taking Fioricet for pain with no improvement. Will obtain lab studies. Pt given Tylenol 325 mg PO for the HA    6:19 PM lab studies reviewed as detailed above. Results reviewed with pt. Will d/c her to home per instructions to f/u with her PCP         Clinical Impression <redacted file path>:  1. Bell's palsy    2. Frequent headaches         Condition: Stable and Improved     Disposition <redacted file path>:Discharge       Documentation prepared by Yetta Numbers, EMT IP acting as medical scribe for this provider.  I was present and performed active  documentation with the provider while the patient was being cared for in the  emergency department. 04/09/22 5:21 PM

## 2022-04-12 ENCOUNTER — Ambulatory Visit: Payer: Medicare Other | Attending: Physician Assistant | Admitting: Physician Assistant

## 2022-04-12 ENCOUNTER — Other Ambulatory Visit: Payer: Self-pay

## 2022-04-12 ENCOUNTER — Encounter (INDEPENDENT_AMBULATORY_CARE_PROVIDER_SITE_OTHER): Payer: Self-pay | Admitting: Physician Assistant

## 2022-04-12 VITALS — BP 138/82 | HR 63 | Temp 98.6°F | Ht 60.0 in | Wt 167.0 lb

## 2022-04-12 DIAGNOSIS — D72829 Elevated white blood cell count, unspecified: Secondary | ICD-10-CM | POA: Insufficient documentation

## 2022-04-12 DIAGNOSIS — R519 Headache, unspecified: Secondary | ICD-10-CM | POA: Insufficient documentation

## 2022-04-12 DIAGNOSIS — R7309 Other abnormal glucose: Secondary | ICD-10-CM | POA: Insufficient documentation

## 2022-04-12 DIAGNOSIS — G510 Bell's palsy: Secondary | ICD-10-CM | POA: Insufficient documentation

## 2022-04-12 MED ORDER — PREDNISONE 10 MG TABLET
ORAL_TABLET | ORAL | 0 refills | Status: AC
Start: 2022-04-12 — End: 2022-04-24

## 2022-04-12 NOTE — Progress Notes (Signed)
INTERNAL MEDICINE, [facility]  [address]  [place] [address]  Operated by [facility]  History and Physical     Name: Christine Mcmillan MRN:  [id number]   Date: 04/12/2022 Age: 76 y.o.               Follow Up        Reason for Visit: Facial Droop (Right side) headache; follow-up Bell's palsy with recurrent ER visit    History of Present Illness  Christine Mcmillan is a 76 y.o. female who is being seen today in office for follow-up concerning recent Bell's palsy.  Patient has had 3 ER visits bring her Bell's paralysis most recently June 16th at [facility] for complaints of headache right side.  Patient states it was complete ways of time and they did absolutely nothing for her but tell her to go home and take Tylenol or Motrin.  She continues to note the facial droop with watering of her eyes in pain the side of her right scalp into the posterior area right head.  There is no rash redness swelling noted.  No vision complaints.  Initially when she was diagnosed on June 4th patient was given prednisone 20 mg daily for 7 days and Famvir 500 mg b.i.d. x5 days.  Patient states that she felt like she was getting some better but then ran out of medications and has since then relapsed.  While reviewing the chart I do not see any labs obtained to rule out Lyme or other possible causes nor a sed rate.  It was noted as well labs in the emergency room showed white count slightly elevated however patient had been on prednisone possibly related.  She denies any fever chills night sweats.  She does have a history of abnormal glucose in the past with last hemoglobin A1c however stable at 6.  No increased thirst or urination noted follow-up labs needed.    PHQ Questionnaire  Little interest or pleasure in doing things.: Not at all  Feeling down, depressed, or hopeless: Not at all  PHQ 2 Total: 0      Past Medical History:   Diagnosis Date   . Abnormal renal function    . Anxiety    . Arthralgia     . Bell's palsy    . Chronic hypokalemia    . COVID-19 vaccine series completed    . Depression    . Esophageal reflux    . History of memory loss    . History of recurrent UTIs    . Hypertension          Past Surgical History:   Procedure Laterality Date   . BRAIN TUMOR EXCISION     . HX LIPOMA RESECTION N/A     Lipoma on back      Family Medical History:     Problem Relation (Age of Onset)    Cancer Mother          Social History     Tobacco Use   . Smoking status: Never   . Smokeless tobacco: Never   Substance Use Topics   . Alcohol use: Never   . Drug use: Never     Medication:  amLODIPine (NORVASC) 5 mg Oral Tablet, Take 1 Tablet (5 mg total) by mouth Once a day  ergocalciferol, vitamin D2, (DRISDOL) 1,250 mcg (50,000 unit) Oral Capsule, Take 1 Capsule (50,000 Units total) by mouth Every 7 days  ezetimibe (ZETIA) 10  mg Oral Tablet, Take 1 Tablet (10 mg total) by mouth Every evening  furosemide (LASIX) 40 mg Oral Tablet, Take 1 Tablet (40 mg total) by mouth Once per day as needed for Other (Edema)  LORazepam (ATIVAN) 2 mg Oral Tablet, Take 1 Tablet (2 mg total) by mouth Every night as needed for Anxiety  NALOXONE 4 MG/ACTUATION NASAL SPRAY, 1 Spray by INTRANASAL route Every 2 minutes as needed for actual or suspected opioid overdose. Call 911 if used.  nebivoloL (BYSTOLIC) 10 mg Oral Tablet, Take 1 Tablet (10 mg total) by mouth Once a day  pravastatin (PRAVACHOL) 80 mg Oral Tablet, Take 1 Tablet (80 mg total) by mouth Every night  guaiFENesin 100 mg/5 mL Oral Liquid, Take 10 mL (200 mg total) by mouth Every 6 hours as needed (Patient not taking: Reported on 02/22/2022)  potassium chloride (MICRO K) 10 mEq Oral Capsule, Sustained Release, Take 1 Capsule (10 mEq total) by mouth Once a day (Patient not taking: Reported on 01/06/2022)    No facility-administered medications prior to visit.    Allergies:  Allergies   Allergen Reactions   . Acetic Acid-Aluminum Acetate Swelling   . Sertraline  Other Adverse Reaction  (Add comment)     Headache         Physical Exam:  Vitals:    04/12/22 1356   BP: 138/82   Pulse: 63   Temp: 37 C (98.6 F)   TempSrc: Tympanic   SpO2: 98%   Weight: 75.8 kg (167 lb)   Height: 1.524 m (5')   BMI: 32.68      Physical Exam  Vitals and nursing note reviewed.   HENT:      Head: Atraumatic.      Comments: RT sided facial droop noted w facial weakness noted     Right Ear: Tympanic membrane normal.      Left Ear: Tympanic membrane normal.      Mouth/Throat:      Mouth: Mucous membranes are moist.      Pharynx: Oropharynx is clear.   Cardiovascular:      Rate and Rhythm: Normal rate and regular rhythm.      Pulses: Normal pulses.   Pulmonary:      Effort: Pulmonary effort is normal.      Breath sounds: Normal breath sounds.   Abdominal:      General: Bowel sounds are normal.      Palpations: Abdomen is soft.      Tenderness: There is no abdominal tenderness.   Musculoskeletal:         General: No swelling. Normal range of motion.      Cervical back: Normal range of motion and neck supple.   Skin:     General: Skin is warm.      Findings: No lesion or rash.   Neurological:      General: No focal deficit present.      Mental Status: She is alert and oriented to person, place, and time.      Cranial Nerves: No cranial nerve deficit.      Sensory: No sensory deficit.      Motor: No weakness.      Gait: Gait normal.   Psychiatric:         Mood and Affect: Mood normal.         Behavior: Behavior normal.         Thought Content: Thought content normal.  Judgment: Judgment normal.         Assessment/Plan:  Problem List Items Addressed This Visit        Neurologic    Bell's palsy - Primary    Relevant Orders    Referral to External Provider (AMB)    LYME ANTIBODY PANEL WITH REFLEX    C-REACTIVE PROTEIN(CRP),INFLAMMATION    SEDIMENTATION RATE    HEPATITIS C ANTIBODY SCREEN WITH REFLEX TO HCV PCR    HERPES SIMPLEX VIRUS (HSV) TYPE 1- AND TYPE 2-SPECIFIC ABS, IGG    VARICELLA-ZOSTER ANTIBODY, IGG, SERUM     Nonintractable headache    Relevant Orders    C-REACTIVE PROTEIN(CRP),INFLAMMATION    SEDIMENTATION RATE    HERPES SIMPLEX VIRUS (HSV) TYPE 1- AND TYPE 2-SPECIFIC ABS, IGG    VARICELLA-ZOSTER ANTIBODY, IGG, SERUM       Hematologic/Lymphatic    Leukocytosis    Relevant Orders    CBC/DIFF       Other    Abnormal glucose    Relevant Orders    HGA1C (HEMOGLOBIN A1C WITH EST AVG GLUCOSE)        ER visits reviewed in detail  Labs obtained in office  Referred to neuro  pred 60 mg taper dose as noted  Tylenol 650mg  tid until headache improves  Any acute issues or changes seek ER  F/u otherwise as scheduled       Follow up: Return in about 18 days (around 04/30/2022) for In Person Visit.  Seek medical attention for new or worsening symptoms.  Patient has been seen in this clinic within the last 3 years.     Dilcia Rybarczyk, PA-C          This note was partially created using MModal Fluency Direct system (voice recognition software) and is inherently subject to errors including those of syntax and "sound-alike" substitutions which may escape proofreading.  In such instances, original meaning may be extrapolated by contextual derivation.

## 2022-04-12 NOTE — Nursing Note (Signed)
Patient is here for the evaluation of Bell's Palsy. Patient states that the right side of her face started to droop around June 8th. Patient states that she has been to the ER 3 times and finished a prescription of Prednisone.

## 2022-04-13 LAB — CBC WITH DIFF
BASOPHIL #: 0.1 10*3/uL (ref 0.00–0.30)
BASOPHIL %: 1 % (ref 0–3)
EOSINOPHIL #: 0.5 10*3/uL (ref 0.00–0.80)
EOSINOPHIL %: 5 % (ref 0–7)
HCT: 44.7 % (ref 37.0–47.0)
HGB: 14.9 g/dL (ref 12.5–16.0)
LYMPHOCYTE #: 3.7 10*3/uL (ref 1.10–5.00)
LYMPHOCYTE %: 39 % (ref 25–45)
MCH: 28.8 pg (ref 27.0–32.0)
MCHC: 33.3 g/dL (ref 32.0–36.0)
MCV: 86.7 fL (ref 78.0–99.0)
MONOCYTE #: 0.6 10*3/uL (ref 0.00–1.30)
MONOCYTE %: 7 % (ref 0–12)
MPV: 9.1 fL (ref 7.4–10.4)
NEUTROPHIL #: 4.5 10*3/uL (ref 1.80–8.40)
NEUTROPHIL %: 48 % (ref 40–76)
PLATELETS: 249 10*3/uL (ref 140–440)
RBC: 5.16 10*6/uL (ref 4.20–5.40)
RDW: 14.3 % (ref 11.6–14.8)
WBC: 9.4 10*3/uL (ref 4.0–10.5)
WBCS UNCORRECTED: 9.4 10*3/uL

## 2022-04-13 LAB — HGA1C (HEMOGLOBIN A1C WITH EST AVG GLUCOSE): HEMOGLOBIN A1C: 6 % (ref 4.0–6.0)

## 2022-04-13 LAB — SEDIMENTATION RATE: ERYTHROCYTE SEDIMENTATION RATE (ESR): 7 mm/hr (ref <30)

## 2022-04-13 LAB — C-REACTIVE PROTEIN(CRP),INFLAMMATION: C-REACTIVE PROTEIN (CRP): 0.5 mg/dL (ref 0.1–0.5)

## 2022-04-14 LAB — VARICELLA-ZOSTER ANTIBODY, IGG, SERUM: VZV IGG QUALITATIVE: POSITIVE

## 2022-04-15 LAB — HERPES SIMPLEX VIRUS (HSV) TYPE 1- AND TYPE 2-SPECIFIC ABS, IGG
HSV-1 IGG QUALITATIVE: NEGATIVE
HSV2 IGG QUALITATIVE: POSITIVE — AB

## 2022-04-15 LAB — LYME ANTIBODY PANEL WITH REFLEX: LYME ANTIBODY TOTAL (Screen): NEGATIVE

## 2022-04-22 ENCOUNTER — Telehealth (RURAL_HEALTH_CENTER): Payer: Self-pay | Admitting: Physician Assistant

## 2022-04-22 NOTE — Telephone Encounter (Addendum)
I call her an left voicemail for her to go to ER if having new issues.           Patient called an left Voicemail saying that the other side of her face is drawing up. She wanted you to know in case you need to call her something in for it.

## 2022-04-29 ENCOUNTER — Ambulatory Visit: Payer: Medicare Other | Attending: Physician Assistant | Admitting: Physician Assistant

## 2022-04-29 ENCOUNTER — Other Ambulatory Visit: Payer: Self-pay

## 2022-04-29 ENCOUNTER — Encounter (INDEPENDENT_AMBULATORY_CARE_PROVIDER_SITE_OTHER): Payer: Self-pay | Admitting: Physician Assistant

## 2022-04-29 VITALS — BP 120/84 | HR 64 | Temp 97.7°F | Ht 60.0 in | Wt 169.0 lb

## 2022-04-29 DIAGNOSIS — G4700 Insomnia, unspecified: Secondary | ICD-10-CM | POA: Insufficient documentation

## 2022-04-29 DIAGNOSIS — E785 Hyperlipidemia, unspecified: Secondary | ICD-10-CM

## 2022-04-29 DIAGNOSIS — F419 Anxiety disorder, unspecified: Secondary | ICD-10-CM | POA: Insufficient documentation

## 2022-04-29 DIAGNOSIS — Z91199 Patient's noncompliance with other medical treatment and regimen due to unspecified reason: Secondary | ICD-10-CM | POA: Insufficient documentation

## 2022-04-29 DIAGNOSIS — G510 Bell's palsy: Secondary | ICD-10-CM | POA: Insufficient documentation

## 2022-04-29 DIAGNOSIS — F32A Depression, unspecified: Secondary | ICD-10-CM | POA: Insufficient documentation

## 2022-04-29 MED ORDER — LORAZEPAM 2 MG TABLET
2.0000 mg | ORAL_TABLET | Freq: Every evening | ORAL | 2 refills | Status: DC | PRN
Start: 2022-04-29 — End: 2022-06-16

## 2022-04-29 NOTE — Nursing Note (Signed)
Patient is here for routine 1 month follow up for medication refills.

## 2022-04-29 NOTE — Progress Notes (Signed)
INTERNAL MEDICINE, BUILDING A  [address]  [place] [address]  Operated by [facility]  History and Physical     Name: Christine Mcmillan MRN:  [id number]   Date: 04/29/2022 Age: 76 y.o.               Follow Up        Reason for Visit: Follow Up (Routine ) follow-up Bell's palsy     History of Present Illness  Christine Mcmillan is a 76 y.o. female who is being seen today in office for follow-up concerning recent Bell's palsy.  Patient once again had 3 ER visits due to  Bell's paralysis and continues to note the RT facial droop with watering of her eyes and pain on the side of her right scalp into the posterior area right head.    No vision complaints noted and pt seen by Dr Hessie Knows this am who stated her eye looked good and will f/u 6 weeks.  Drooping of RT eye is some better but still bothersome.  Patient has also been referred to neurologist.    F/U chol w recent labs showing chol 244  [phone] and LDL 158. Pt taking Pravachol 80 mg nightly as well as Zetia 10 mg nightly but admits to missing medication frequently.  Discussion today of compliance with diet made.  Patient states she will do better with taking her medication.      Follow-up chronic anxiety/insomnia for which patient uses Ativan 2 mg nightly.  Patient has went back and forth from several medications including Xanax Valium trazodone and still has issues sleeping as well as anxiety specifically in the evenings.  She often states that she sleeps 1-2 hours in 3-4 days.  It has been recommended she see a sleep specialist numerous times but due to her insurance issues she has refused.  Most recently states she did get a appointment since getting Medicare and will comply with follow-up.  Request refill of Ativan.      PHQ Questionnaire  Little interest or pleasure in doing things.: Not at all  Feeling down, depressed, or hopeless: Not at all  PHQ 2 Total: 0      Past Medical History:   Diagnosis Date    Abnormal renal function      Anxiety     Arthralgia     Bell's palsy     Chronic hypokalemia     COVID-19 vaccine series completed     Depression     Esophageal reflux     History of memory loss     History of recurrent UTIs     Hypertension          Past Surgical History:   Procedure Laterality Date    BRAIN TUMOR EXCISION      HX LIPOMA RESECTION N/A     Lipoma on back      Family Medical History:       Problem Relation (Age of Onset)    Cancer Mother            Social History     Tobacco Use    Smoking status: Never    Smokeless tobacco: Never   Substance Use Topics    Alcohol use: Never    Drug use: Never     Medication:  ergocalciferol, vitamin D2, (DRISDOL) 1,250 mcg (50,000 unit) Oral Capsule, Take 1 Capsule (50,000 Units total) by mouth Every 7 days  ezetimibe (ZETIA) 10 mg Oral Tablet, Take  1 Tablet (10 mg total) by mouth Every evening  furosemide (LASIX) 40 mg Oral Tablet, Take 1 Tablet (40 mg total) by mouth Once per day as needed for Other (Edema)  NALOXONE 4 MG/ACTUATION NASAL SPRAY, 1 Spray by INTRANASAL route Every 2 minutes as needed for actual or suspected opioid overdose. Call 911 if used.  nebivoloL (BYSTOLIC) 10 mg Oral Tablet, Take 1 Tablet (10 mg total) by mouth Once a day  pravastatin (PRAVACHOL) 80 mg Oral Tablet, Take 1 Tablet (80 mg total) by mouth Every night  amLODIPine (NORVASC) 5 mg Oral Tablet, Take 1 Tablet (5 mg total) by mouth Once a day  LORazepam (ATIVAN) 2 mg Oral Tablet, Take 1 Tablet (2 mg total) by mouth Every night as needed for Anxiety    No facility-administered medications prior to visit.    Allergies:  Allergies   Allergen Reactions    Acetic Acid-Aluminum Acetate Swelling    Sertraline  Other Adverse Reaction (Add comment)     Headache         Physical Exam:  Vitals:    04/29/22 1343   BP: 120/84   Pulse: 64   Temp: 36.5 C (97.7 F)   TempSrc: Tympanic   SpO2: 97%   Weight: 76.7 kg (169 lb)   Height: 1.524 m (5')   BMI: 33.07      Physical Exam  Vitals and nursing note reviewed.   HENT:      Head:  Atraumatic.      Comments: RT sided facial droop noted w facial weakness noted however improved     Right Ear: Tympanic membrane normal.      Left Ear: Tympanic membrane normal.      Mouth/Throat:      Mouth: Mucous membranes are moist.      Pharynx: Oropharynx is clear.   Cardiovascular:      Rate and Rhythm: Normal rate and regular rhythm.      Pulses: Normal pulses.   Pulmonary:      Effort: Pulmonary effort is normal.      Breath sounds: Normal breath sounds.   Abdominal:      General: Bowel sounds are normal.      Palpations: Abdomen is soft.      Tenderness: There is no abdominal tenderness.   Musculoskeletal:         General: No swelling. Normal range of motion.      Cervical back: Normal range of motion and neck supple.   Skin:     General: Skin is warm.      Findings: No lesion or rash.   Neurological:      General: No focal deficit present.      Mental Status: She is alert and oriented to person, place, and time.      Cranial Nerves: No cranial nerve deficit.      Sensory: No sensory deficit.      Motor: No weakness.      Gait: Gait normal.   Psychiatric:         Mood and Affect: Mood normal.         Behavior: Behavior normal.         Thought Content: Thought content normal.         Judgment: Judgment normal.       Assessment/Plan:  Problem List Items Addressed This Visit          Cardiovascular System    Hyperlipidemia  Neurologic    Insomnia    Bell's palsy - Primary       Psychiatric    Anxiety and depression       Other    Patient noncompliance, general        Patient's right eye was taped up today with instructions for her to do at home as needed  Continue follow-up with Dr. Jacki Cones in 6 weeks as scheduled  Continue follow-up with neurologist as scheduled  Patient has refused physical therapy for facial weakness  To take cholesterol medications as directed  Continue diet exercise weight management  Ativan refilled as noted  F/u otherwise as scheduled       Follow up:  Post-Discharge Follow Up  Appointments       Thursday Jul 22, 2022    Return Patient Visit with Cleophus Molt, PA-C at 11:00 AM      Wednesday Jul 28, 2022    NEW PATIENT VISIT with Cleophus Molt, PA-C at  1:00 PM      Tuesday Aug 10, 2022    Appointment with Cleophus Molt, PA-C at 11:30 AM      Thursday Aug 19, 2022    Return Patient Visit with Cleophus Molt, PA-C at  2:30 PM      Tuesday Sep 21, 2022    Return Patient Visit with Cleophus Molt, PA-C at  1:00 PM      Wednesday Oct 20, 2022    Return Patient Visit with Cleophus Molt, PA-C at  1:00 PM      Thursday Nov 18, 2022    Return Patient Visit with Cleophus Molt, PA-C at  1:30 PM      Internal Medicine, [facility]  [facility]  [address]  [place] [address]  [phone]         Seek medical attention for new or worsening symptoms.  Patient has been seen in this clinic within the last 3 years.     Demetrick Eichenberger, PA-C          This note was partially created using MModal Fluency Direct system (voice recognition software) and is inherently subject to errors including those of syntax and "sound-alike" substitutions which may escape proofreading.  In such instances, original meaning may be extrapolated by contextual derivation.

## 2022-04-30 ENCOUNTER — Telehealth (INDEPENDENT_AMBULATORY_CARE_PROVIDER_SITE_OTHER): Payer: Self-pay | Admitting: Physician Assistant

## 2022-04-30 MED ORDER — RIZATRIPTAN 5 MG DISINTEGRATING TABLET
5.0000 mg | ORAL_TABLET | Freq: Once | ORAL | 1 refills | Status: DC | PRN
Start: 2022-04-30 — End: 2023-09-14

## 2022-04-30 NOTE — Telephone Encounter (Signed)
Patient called wanting something for Miagraine. verbel order given by Amy to send in Maxalt

## 2022-05-04 ENCOUNTER — Telehealth (INDEPENDENT_AMBULATORY_CARE_PROVIDER_SITE_OTHER): Payer: Self-pay | Admitting: Physician Assistant

## 2022-05-04 NOTE — Telephone Encounter (Signed)
Patient called yesterday stating that she dropped her lorazepam down in the toilet.   I explained to her on voicemail that a control can't be refilled until its time for it.   She called back this morning wanting Xanax sent in because she didn't sleep last night.

## 2022-05-05 ENCOUNTER — Ambulatory Visit (HOSPITAL_COMMUNITY): Payer: Self-pay

## 2022-05-06 ENCOUNTER — Other Ambulatory Visit (INDEPENDENT_AMBULATORY_CARE_PROVIDER_SITE_OTHER): Payer: Self-pay | Admitting: Physician Assistant

## 2022-05-06 MED ORDER — TRAZODONE 50 MG TABLET
50.0000 mg | ORAL_TABLET | Freq: Every evening | ORAL | 0 refills | Status: DC
Start: 2022-05-06 — End: 2022-06-16

## 2022-05-10 NOTE — Progress Notes (Addendum)
Dear Christine Mcmillan:    Your doctor feels this visit is medically necessary in maintaining your health.  One appointment for this test/visit was not kept.  In the event you are unable to keep your scheduled appointment that your doctor ordered, please feel free to call and change the date or time of the appointment.    Please contact your doctor to discuss your other health care options if you are not interested in having this test/visit completed.    If you have any questions, please feel free to contact your physician by calling the [facility] at [phone] or toll free [phone].      Sincerely,    Staff of the Department Medicine  [facility]  [place]

## 2022-05-12 ENCOUNTER — Telehealth (INDEPENDENT_AMBULATORY_CARE_PROVIDER_SITE_OTHER): Payer: Self-pay | Admitting: Physician Assistant

## 2022-05-17 NOTE — Progress Notes (Deleted)
Pt. Was not seen

## 2022-05-17 NOTE — Progress Notes (Deleted)
Pt. Not seen

## 2022-05-25 ENCOUNTER — Encounter (INDEPENDENT_AMBULATORY_CARE_PROVIDER_SITE_OTHER): Payer: Medicare Other | Admitting: Physician Assistant

## 2022-05-27 ENCOUNTER — Other Ambulatory Visit (INDEPENDENT_AMBULATORY_CARE_PROVIDER_SITE_OTHER): Payer: Self-pay | Admitting: Physician Assistant

## 2022-05-27 NOTE — Telephone Encounter (Signed)
Looks like it has 2 refills but pharmacy would not give it to her.

## 2022-05-31 ENCOUNTER — Ambulatory Visit (INDEPENDENT_AMBULATORY_CARE_PROVIDER_SITE_OTHER): Payer: Medicare Other | Admitting: Physician Assistant

## 2022-06-07 NOTE — ED Provider Notes (Signed)
 ED Provider Note    Subjective     Christine Mcmillan is a 76 y.o. female who presents to the ED for complaint of fall, hand injury, abdominal pain, back pain, leg pain. Pt has hx of hypertension, arthritis. Pt to ED c/o rt hip and lt knee pain after a fall on Saturday. Pt states she was running in flip flops when she fell, she denies head injury or LOC. Pt states the pain is worse w/ movement, and states the rt hip pain bothers her more than the lt knee. Pt also c/o dorsal rt hand wound from a previous fall, stating she ran the back of her hand across carpet when she fell. Pt states she had been cleaning the wound w/ hydrogen peroxide, but states it has not improved much. Pt denies extremity numbness, tingling, dizziness, headache, lightheadedness, nausea, vomiting.       History provided by:  Medical records and patient  Language interpreter used: No        Objective     Vitals <redacted file path>:  ED Triage Vitals [06/07/22 1841]   BP Pulse Resp Temp SpO2 Flow (L/min)   (!) 157/81 53 20 97.8 F (36.6 C) 100 % --     Physical Exam  Vitals and nursing note reviewed.   Constitutional:       General: She is not in acute distress.     Appearance: She is normal weight.   Cardiovascular:      Rate and Rhythm: Normal rate and regular rhythm.      Pulses: Normal pulses.      Heart sounds: Normal heart sounds.   Pulmonary:      Effort: Pulmonary effort is normal.      Breath sounds: Normal breath sounds.   Musculoskeletal:      Right upper arm: No swelling, edema, deformity, lacerations or bony tenderness.      Lumbar back: Negative right straight leg raise test.      Right hip: Tenderness present. No deformity, bony tenderness or crepitus. Normal range of motion. Normal strength.      Left knee: No swelling, deformity, erythema, ecchymosis, lacerations, bony tenderness or crepitus. Normal range of motion. Tenderness (at lt medial knee joint) present. Normal alignment. Normal pulse.      Right lower leg: No edema.       Left lower leg: No edema.      Comments: -Ecchymosis noted on rt lateral upper arm humeral area.   - 2x4 cm ulcerated lesion on dorsum of rt hand, minimal surrounding erythema. No warmth, fluctuance, or drainage.    Skin:     General: Skin is warm and dry.   Neurological:      General: No focal deficit present.           Assessment and Plan:         Medical Decision Making  Patient with 2 recent falls, both mechanical, this time she tripped while wearing flip-flops fell and landed on her front side injuring her left knee, right hip and right humerus area.  X-rays of all 3 areas showed no acute fracture or dislocation.  She did also have a wound on her dorsal surface of her right hand from a previous fall that shows some granulation tissue, no significant surrounding erythema.  She has been using hydrogen peroxide on it, advised against using this due to impaired wound healing, apply Bactroban ointment here and sent home with remainder of tube.  Follow-up with  PCP within 1 week to follow-up on pain and hand wound.  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    Amount and/or Complexity of Data Reviewed  Radiology: ordered and independent interpretation performed. Decision-making details documented in ED Course.      Risk  OTC drugs.  Prescription drug management.       "Risk" in the MDM section refers to billing criteria on potential for complications and/or morbidity/mortality of management as defined by the [facility] and CMS    ED Course <redacted file path>:      ED Course as of 06/07/22 1942   Mon Jun 07, 2022   1925 XR HUMERUS RT 2+ VW  No acute fracture or dislocation [CH]   1925 XR KNEE LT < 3 VWS  Bone spur on the patellar ligament insertion point on the femur, no acute fracture or dislocation, joint narrowing consistent with osteoarthritis. [CH]   1926 XR HIP UNI AND PELVIS IF PERFORMED 2 OR 3 VW RT  No acute fracture or dislocation [CH]      ED Course User Index  [CH] Andreas Ohm, MD      Procedures      Clinical Impression <redacted file path>:  1. Fall, initial encounter Acute   2. Contusion of right hand, initial encounter Acute   3. Acute pain of left knee    4. Right hip pain       Condition: Stable  Disposition <redacted file path>:

## 2022-06-16 ENCOUNTER — Other Ambulatory Visit: Payer: Self-pay

## 2022-06-16 ENCOUNTER — Other Ambulatory Visit (HOSPITAL_BASED_OUTPATIENT_CLINIC_OR_DEPARTMENT_OTHER): Payer: Medicare Other

## 2022-06-16 ENCOUNTER — Emergency Department
Admission: EM | Admit: 2022-06-16 | Discharge: 2022-06-16 | Disposition: A | Discharge status: Home / Self Care | Payer: Medicare Other | Attending: Emergency Medicine | Admitting: Emergency Medicine

## 2022-06-16 ENCOUNTER — Encounter (INDEPENDENT_AMBULATORY_CARE_PROVIDER_SITE_OTHER): Payer: Self-pay | Admitting: Physician Assistant

## 2022-06-16 ENCOUNTER — Ambulatory Visit (HOSPITAL_BASED_OUTPATIENT_CLINIC_OR_DEPARTMENT_OTHER): Payer: Medicare Other | Admitting: Physician Assistant

## 2022-06-16 VITALS — BP 124/78 | HR 55 | Temp 97.6°F | Ht 60.0 in | Wt 167.0 lb

## 2022-06-16 DIAGNOSIS — R5382 Chronic fatigue, unspecified: Secondary | ICD-10-CM | POA: Insufficient documentation

## 2022-06-16 DIAGNOSIS — D72829 Elevated white blood cell count, unspecified: Secondary | ICD-10-CM | POA: Insufficient documentation

## 2022-06-16 DIAGNOSIS — S61411D Laceration without foreign body of right hand, subsequent encounter: Secondary | ICD-10-CM

## 2022-06-16 DIAGNOSIS — M25551 Pain in right hip: Secondary | ICD-10-CM | POA: Insufficient documentation

## 2022-06-16 DIAGNOSIS — N289 Disorder of kidney and ureter, unspecified: Secondary | ICD-10-CM

## 2022-06-16 DIAGNOSIS — E785 Hyperlipidemia, unspecified: Secondary | ICD-10-CM

## 2022-06-16 DIAGNOSIS — Z9181 History of falling: Secondary | ICD-10-CM | POA: Insufficient documentation

## 2022-06-16 DIAGNOSIS — R7309 Other abnormal glucose: Secondary | ICD-10-CM | POA: Insufficient documentation

## 2022-06-16 DIAGNOSIS — W19XXXA Unspecified fall, initial encounter: Secondary | ICD-10-CM

## 2022-06-16 DIAGNOSIS — I10 Essential (primary) hypertension: Secondary | ICD-10-CM | POA: Insufficient documentation

## 2022-06-16 DIAGNOSIS — Z79899 Other long term (current) drug therapy: Secondary | ICD-10-CM

## 2022-06-16 LAB — COMPREHENSIVE METABOLIC PNL, FASTING
ALBUMIN/GLOBULIN RATIO: 1.6 — ABNORMAL HIGH (ref 0.8–1.4)
ALBUMIN: 4.6 g/dL (ref 3.5–5.7)
ALKALINE PHOSPHATASE: 67 U/L (ref 34–104)
ALT (SGPT): 16 U/L (ref 7–52)
ANION GAP: 10 mmol/L (ref 4–13)
AST (SGOT): 24 U/L (ref 13–39)
BILIRUBIN TOTAL: 0.8 mg/dL (ref 0.3–1.2)
BUN/CREA RATIO: 18 (ref 6–22)
BUN: 17 mg/dL (ref 7–25)
CALCIUM, CORRECTED: 9.4 mg/dL (ref 8.9–10.8)
CALCIUM: 10 mg/dL (ref 8.6–10.3)
CHLORIDE: 101 mmol/L (ref 98–107)
CO2 TOTAL: 27 mmol/L (ref 21–31)
CREATININE: 0.96 mg/dL (ref 0.60–1.30)
ESTIMATED GFR: 61 mL/min/{1.73_m2} (ref >59)
GLOBULIN: 2.8 — ABNORMAL LOW (ref 2.9–5.4)
GLUCOSE: 103 mg/dL (ref 74–109)
OSMOLALITY, CALCULATED: 278 mOsm/kg (ref 270–290)
POTASSIUM: 4.2 mmol/L (ref 3.5–5.1)
PROTEIN TOTAL: 7.4 g/dL (ref 6.4–8.9)
SODIUM: 138 mmol/L (ref 136–145)

## 2022-06-16 LAB — URINE DRUG SCREEN
AMPHET QL: NEGATIVE
BARB QL: NEGATIVE
BENZO QL: NEGATIVE
BUP QL: NEGATIVE
CANNAQL: NEGATIVE
COCQL: NEGATIVE
FENTANYL, RANDOM URINE: NEGATIVE
METHQL: NEGATIVE
OPIATE: NEGATIVE
OXYCODONE URINE: NEGATIVE
PCP QL: NEGATIVE

## 2022-06-16 LAB — CBC WITH DIFF
BASOPHIL #: 0.1 10*3/uL (ref 0.00–0.30)
BASOPHIL %: 1 % (ref 0–3)
EOSINOPHIL #: 0.3 10*3/uL (ref 0.00–0.80)
EOSINOPHIL %: 4 % (ref 0–7)
HCT: 45.7 % (ref 37.0–47.0)
HGB: 15.5 g/dL (ref 12.5–16.0)
LYMPHOCYTE #: 2.6 10*3/uL (ref 1.10–5.00)
LYMPHOCYTE %: 35 % (ref 25–45)
MCH: 29.3 pg (ref 27.0–32.0)
MCHC: 34 g/dL (ref 32.0–36.0)
MCV: 86.3 fL (ref 78.0–99.0)
MONOCYTE #: 0.6 10*3/uL (ref 0.00–1.30)
MONOCYTE %: 8 % (ref 0–12)
MPV: 9 fL (ref 7.4–10.4)
NEUTROPHIL #: 3.7 10*3/uL (ref 1.80–8.40)
NEUTROPHIL %: 52 % (ref 40–76)
PLATELETS: 314 10*3/uL (ref 140–440)
RBC: 5.29 10*6/uL (ref 4.20–5.40)
RDW: 14.6 % (ref 11.6–14.8)
WBC: 7.3 10*3/uL (ref 4.0–10.5)
WBCS UNCORRECTED: 7.3 10*3/uL

## 2022-06-16 LAB — MICROALBUMIN/CREATININE RATIO, URINE, RANDOM
CREATININE RANDOM URINE: 190 mg/dL — ABNORMAL HIGH (ref 11–26)
MICROALBUMIN RANDOM URINE: 6.5 mg/dL
MICROALBUMIN/CREATININE RATIO RANDOM URINE: 34.2 mg/g

## 2022-06-16 LAB — LIPID PANEL
CHOL/HDL RATIO: 4.8
CHOLESTEROL: 244 mg/dL — ABNORMAL HIGH (ref <200)
HDL CHOL: 51 mg/dL (ref 23–92)
LDL CALC: 158 mg/dL — ABNORMAL HIGH (ref 0–100)
TRIGLYCERIDES: 177 mg/dL — ABNORMAL HIGH (ref <=150)
VLDL CALC: 35 mg/dL (ref 0–50)

## 2022-06-16 LAB — FOLATE: FOLATE: 9.6 ng/mL (ref 5.9–24.4)

## 2022-06-16 LAB — THYROID STIMULATING HORMONE WITH FREE T4 REFLEX: TSH: 1.684 u[IU]/mL (ref 0.450–5.330)

## 2022-06-16 LAB — HGA1C (HEMOGLOBIN A1C WITH EST AVG GLUCOSE): HEMOGLOBIN A1C: 5.7 % (ref 4.0–6.0)

## 2022-06-16 LAB — VITAMIN B12: VITAMIN B 12: >1500 pg/mL — ABNORMAL HIGH (ref 180–914)

## 2022-06-16 LAB — MAGNESIUM: MAGNESIUM: 2 mg/dL (ref 1.9–2.7)

## 2022-06-16 MED ORDER — AMLODIPINE 5 MG TABLET
5.0000 mg | ORAL_TABLET | Freq: Every day | ORAL | 1 refills | Status: DC
Start: 2022-06-16 — End: 2022-11-10

## 2022-06-16 MED ORDER — TRAZODONE 50 MG TABLET
50.0000 mg | ORAL_TABLET | Freq: Every evening | ORAL | 0 refills | Status: DC
Start: 2022-06-16 — End: 2023-08-29

## 2022-06-16 MED ORDER — ACETAMINOPHEN 300 MG-CODEINE 30 MG TABLET
1.0000 | ORAL_TABLET | ORAL | 0 refills | Status: DC | PRN
Start: 2022-06-16 — End: 2022-07-06

## 2022-06-16 MED ORDER — CYANOCOBALAMIN (VIT B-12) 1,000 MCG/ML INJECTION SOLUTION
1000.0000 ug | Freq: Once | INTRAMUSCULAR | Status: AC
Start: 2022-06-16 — End: 2022-06-16
  Administered 2022-06-16: 1000 ug via INTRAMUSCULAR

## 2022-06-16 MED ORDER — LORAZEPAM 2 MG TABLET
2.0000 mg | ORAL_TABLET | Freq: Every evening | ORAL | 2 refills | Status: DC | PRN
Start: 2022-06-16 — End: 2022-07-22

## 2022-06-16 NOTE — Nursing Note (Signed)
Patient is here today for routine 1 month follow up for medication refills.

## 2022-06-16 NOTE — Nursing Note (Signed)
06/16/22 1300   Urine test  (S[employer]ens Multistix 10 SG)   Performed Status: Manual   Time collected 1245   Color (Ref Range: Yellow) (!) Research scientist (life sciences) (Ref Range: Clear) (!) Cloudy   Glucose (Ref Range: Negative mg/dL) Negative   Bilirubin (Ref Range: Negative mg/dL) Negative   Ketones (Ref Range: Negative mg/dL) Negative   Urine Specific Gravity (Ref Range: 1.005 - 1.030) 1.010   Blood (urine) (Ref Range: Negative mg/dL) (!) Trace   pH (Ref Range: 5.0 - 8.0) 6.0   Protein (Ref Range: Negative mg/dL) (!) 1+ (30mg /dL)   Urobilinogen (Ref Range: Negative mg/dL) 0.2mg /dL (Normal)   Nitrite (Ref Range: Negative) (!) Positive   Leukocytes (Ref Range: Negative WBC's/uL) (!) 1+   Initials KD

## 2022-06-16 NOTE — Progress Notes (Signed)
INTERNAL MEDICINE, BUILDING A  [address]  [place] [address]  Operated by [facility]  History and Physical     Name: Christine Mcmillan MRN:  [id number]   Date: 06/16/2022 Age: 76 y.o.               Follow Up        Reason for Visit: Follow Up (Routine ) fall right hip pain the T    History of Present Illness  Christine Mcmillan is a 76 y.o. female who is being seen today in office for follow-up however complains of right anterior hip pain noted after reported fall 1 week ago.  Patient states that she did go to the ER 3 days after falling because of hip pain at which time she had x-rays of her hip and pelvis.  States she was told everything was normal upon review of chart positive findings included arthritis however recommendations for CT scan of the pain persisted.  Patient states that pain is worse upon standing or walking.  She has had no weakness instability or recurrent fall due to her hip pain.  Denies numbness tingling of the leg.  Patient states the fall was related to wearing flip-flops in getting "tripped up".  She also has a small skin tear of her right posterior hand that she went to [place] concerning over the weekend because she thought it was infected given Keflex which she has completed for the past 3 days and thinks area looks better.    Follow-up blood pressure/hypertension currently stable at 120 4/78.  Patient currently taking Norvasc 5 mg daily along with Bystolic 10 mg daily.  At this time denies headaches dizziness chest pain.    Follow-up abnormal white count with previous labs noting elevation.  Patient states that she has been on prednisone due to recent Bell's palsy possibly related.  She denies any night sweats chills fever or other signs of infection.  Follow-up labs needed.    Follow-up abnormal glucose without noted history of diabetes.  Last hemoglobin A1c stable and patient denies increased thirst urination.  Follow-up labs needed.      Follow-up  cholesterol/lipid with patient currently on Pravachol 80 mg nightly.  Patient denies any side effects or issues with medication.  Follow-up labs needed.      Follow-up renal function with history of abnormal BUN and creatinine as well as low GFR.  Patient has been referred to the nephrologist concerning abnormal findings.  At this time she denies any urinary symptoms.  She does have a history of recurrent UTI noted.  Chronic fatigue also noted for which she had B12 injections in the past states they do help requesting 1 today.    Patient states that she did follow-up with a neurologist Dr. Bosie Helper concerning recent Bell's palsy.  States that overall her findings upon eval showed significant improvement but he recommended she get an MRI of her brain.  He also started her on Lexapro 10 mg nightly the patient states the Lexapro has not helped her at all so she has stopped it.  She has a follow-up appoint with him tomorrow.    PHQ Questionnaire  Little interest or pleasure in doing things.: Not at all  Feeling down, depressed, or hopeless: Not at all  PHQ 2 Total: 0  Trouble falling or staying asleep, or sleeping too much.: Not at all  Feeling tired or having little energy: Not at all  Poor appetite or overeating: Not at  all  Feeling bad about yourself/ that you are a failure in the past 2 weeks?: Not at all  Trouble concentrating on things in the past 2 weeks?: Not at all  Moving/Speaking slowly or being fidgety or restless  in the past 2 weeks?: Not at all  Thoughts that you would be better off DEAD, or of hurting yourself in some way.: Not at all  PHQ 9 Total: 0  Interpretation of Total Score: 0-4 No depression      Past Medical History:   Diagnosis Date    Abnormal renal function     Anxiety     Arthralgia     Bell's palsy     Chronic hypokalemia     COVID-19 vaccine series completed     Depression     Esophageal reflux     History of memory loss     History of recurrent UTIs     Hypertension          Past Surgical  History:   Procedure Laterality Date    BRAIN TUMOR EXCISION      HX LIPOMA RESECTION N/A     Lipoma on back      Family Medical History:       Problem Relation (Age of Onset)    Cancer Mother            Social History     Tobacco Use    Smoking status: Never    Smokeless tobacco: Never   Substance Use Topics    Alcohol use: Never    Drug use: Never     Medication:  ergocalciferol, vitamin D2, (DRISDOL) 1,250 mcg (50,000 unit) Oral Capsule, Take 1 Capsule (50,000 Units total) by mouth Every 7 days  ezetimibe (ZETIA) 10 mg Oral Tablet, Take 1 Tablet (10 mg total) by mouth Every evening  furosemide (LASIX) 40 mg Oral Tablet, Take 1 Tablet (40 mg total) by mouth Once per day as needed for Other (Edema)  NALOXONE 4 MG/ACTUATION NASAL SPRAY, 1 Spray by INTRANASAL route Every 2 minutes as needed for actual or suspected opioid overdose. Call 911 if used.  nebivoloL (BYSTOLIC) 10 mg Oral Tablet, Take 1 Tablet (10 mg total) by mouth Once a day  pravastatin (PRAVACHOL) 80 mg Oral Tablet, Take 1 Tablet (80 mg total) by mouth Every night  rizatriptan (MAXALT-MLT) 5 mg Oral Tablet, Rapid Dissolve, Take 1 Tablet (5 mg total) by mouth Once, as needed May repeat in 2 hours if needed.  amLODIPine (NORVASC) 5 mg Oral Tablet, Take 1 Tablet (5 mg total) by mouth Once a day  LORazepam (ATIVAN) 2 mg Oral Tablet, Take 1 Tablet (2 mg total) by mouth Every night as needed for Anxiety  traZODone (DESYREL) 50 mg Oral Tablet, Take 1 Tablet (50 mg total) by mouth Every night    No facility-administered medications prior to visit.    Allergies:  Allergies   Allergen Reactions    Acetic Acid-Aluminum Acetate Swelling    Sertraline  Other Adverse Reaction (Add comment)     Headache         Physical Exam:  Vitals:    06/16/22 1137   BP: 124/78   Pulse: 55   Temp: 36.4 C (97.6 F)   TempSrc: Tympanic   SpO2: 95%   Weight: 75.8 kg (167 lb)   Height: 1.524 m (5')   BMI: 32.68      Physical Exam  Vitals and nursing note reviewed.   HENT:  Head:  Atraumatic.      Comments: RT sided facial droop noted w facial weakness previously noted is significantly improved     Right Ear: Tympanic membrane normal.      Left Ear: Tympanic membrane normal.      Mouth/Throat:      Mouth: Mucous membranes are moist.      Pharynx: Oropharynx is clear.   Cardiovascular:      Rate and Rhythm: Normal rate and regular rhythm.      Pulses: Normal pulses.   Pulmonary:      Effort: Pulmonary effort is normal.      Breath sounds: Normal breath sounds.   Abdominal:      General: Bowel sounds are normal.      Palpations: Abdomen is soft.      Tenderness: There is no abdominal tenderness.   Musculoskeletal:         General: No swelling. Normal range of motion.      Cervical back: Normal range of motion and neck supple.      Comments: Range of motion right hip seems intact however tenderness on palpation anterior right hip joint noted.  Pain also increased in area upon standing.  She has a limp on the right leg today.  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      Findings: No lesion or rash.      Comments: Scattered proper bilateral upper extremities  Large skin tear right posterior-lateral hand with scabbing; no redness erythema edema or signs of infection noted   Neurological:      General: No focal deficit present.      Mental Status: She is alert and oriented to person, place, and time.      Cranial Nerves: No cranial nerve deficit.      Sensory: No sensory deficit.      Motor: No weakness.      Gait: Gait normal.   Psychiatric:         Mood and Affect: Mood normal.         Behavior: Behavior normal.         Thought Content: Thought content normal.         Judgment: Judgment normal.     Urine Dip Results:   Time collected: 1245  Glucose (Ref Range: Negative mg/dL): Negative  Bilirubin (Ref Range: Negative mg/dL): Negative  Ketones (Ref Range: Negative mg/dL): Negative  Urine Specific Gravity  (Ref Range: 1.005 - 1.030): 1.010  Blood (urine) (Ref Range: Negative mg/dL): (!) Trace  pH (Ref Range: 5.0 - 8.0): 6.0  Protein (Ref Range: Negative mg/dL): (!) 1+ (30mg /dL)  Urobilinogen (Ref Range: Negative mg/dL): 0.2mg /dL (Normal)  Nitrite (Ref Range: Negative): (!) Positive  Leukocytes (Ref Range: Negative WBC's/uL): (!) Small        Assessment/Plan:  Problem List Items Addressed This Visit          Cardiovascular System    Essential hypertension    Relevant Orders    COMPREHENSIVE METABOLIC PNL, FASTING       Hematologic/Lymphatic    Leukocytosis    Relevant Orders    CBC/DIFF       Other    Abnormal renal function    Relevant Orders    COMPREHENSIVE METABOLIC PNL, FASTING    MICROALBUMIN/CREATININE RATIO, URINE, RANDOM    Chronic fatigue    Relevant Orders  CBC/DIFF    MAGNESIUM    THYROID STIMULATING HORMONE WITH FREE T4 REFLEX    FOLATE    VITAMIN B12    Abnormal glucose    Relevant Orders    POCT Urine Dipstick    HGA1C (HEMOGLOBIN A1C WITH EST AVG GLUCOSE)    URINALYSIS, MACROSCOPIC AND MICROSCOPIC W/CULTURE REFLEX    MICROALBUMIN/CREATININE RATIO, URINE, RANDOM     Other Visit Diagnoses       Pain of right hip    -  Primary    Relevant Orders    CT HIP RIGHT WO IV CONTRAST    Hx of fall        Relevant Orders    CT HIP RIGHT WO IV CONTRAST    Skin tear of right hand without complication, subsequent encounter        Hyperlipidemia, unspecified hyperlipidemia type        Relevant Orders    LIPID PANEL    Long-term use of high-risk medication        Relevant Orders    URINE DRUG SCREEN             Labs obtained in office  CT right hip order  Tylenol No. 3 p.r.n. pain given   Urine sent for culture  Will await results for further antibiotic treatment  Skin tear right hand dressed today with antibiotic ointment and gauze a  Meds refilled as noted  B12 shot given  F/u pending lab and x-ray results otherwise as scheduled       Follow up:   Post-Discharge Follow Up Appointments       Thursday Jul 22, 2022     Return Patient Visit with Cleophus Molt, PA-C at 11:00 AM      Wednesday Jul 28, 2022    NEW PATIENT VISIT with Cleophus Molt, PA-C at  1:00 PM      Tuesday Aug 10, 2022    Appointment with Cleophus Molt, PA-C at 11:30 AM      Thursday Aug 19, 2022    Return Patient Visit with Cleophus Molt, PA-C at  2:30 PM      Tuesday Sep 21, 2022    Return Patient Visit with Cleophus Molt, PA-C at  1:00 PM      Wednesday Oct 20, 2022    Return Patient Visit with Cleophus Molt, PA-C at  1:00 PM      Thursday Nov 18, 2022    Return Patient Visit with Cleophus Molt, PA-C at  1:30 PM      Internal Medicine, Building A  [facility]  [address]  [place] [address]  [phone]           Seek medical attention for new or worsening symptoms.  Patient has been seen in this clinic within the last 3 years.     Amy Alvis, PA-C      I personally reviewed the documentation of care provided by the PA-C, Feliberto Harts, D.O.       This note was partially created using MModal Fluency Direct system (voice recognition software) and is inherently subject to errors including those of syntax and "sound-alike" substitutions which may escape proofreading.  In such instances, original meaning may be extrapolated by contextual derivation.

## 2022-06-19 DIAGNOSIS — E785 Hyperlipidemia, unspecified: Secondary | ICD-10-CM | POA: Insufficient documentation

## 2022-06-20 ENCOUNTER — Other Ambulatory Visit (INDEPENDENT_AMBULATORY_CARE_PROVIDER_SITE_OTHER): Payer: Self-pay | Admitting: Physician Assistant

## 2022-06-21 NOTE — Progress Notes (Signed)
Dear Christine Mcmillan:     Your doctor feels this visit is medically necessary in maintaining your health.  One appointment for this test/visit was not kept.  In the event you are unable to keep your scheduled appointment that your doctor ordered, please feel free to call and change the date or time of the appointment.     Please contact your doctor to discuss your other health care options if you are not interested in having this test/visit completed.     If you have any questions, please feel free to contact your physician by calling the [facility] at [phone] or toll free [phone].        Sincerely,     Staff of the Department Medicine  [facility]  [place]

## 2022-06-24 ENCOUNTER — Other Ambulatory Visit (INDEPENDENT_AMBULATORY_CARE_PROVIDER_SITE_OTHER): Payer: Self-pay | Admitting: Physician Assistant

## 2022-06-24 DIAGNOSIS — N289 Disorder of kidney and ureter, unspecified: Secondary | ICD-10-CM

## 2022-06-29 ENCOUNTER — Telehealth (INDEPENDENT_AMBULATORY_CARE_PROVIDER_SITE_OTHER): Payer: Self-pay | Admitting: Physician Assistant

## 2022-06-29 ENCOUNTER — Other Ambulatory Visit (INDEPENDENT_AMBULATORY_CARE_PROVIDER_SITE_OTHER): Payer: Self-pay | Admitting: Physician Assistant

## 2022-06-29 NOTE — Telephone Encounter (Signed)
Patient called wanting Tylenol #3 for joint pain.   Also about her pill count, She stated she wasn't taking ativan because Dr. Aretha Parrot put her on Lexapro. Now she not taking Lexapro and back on Ativan.   Also about her chol. Pravastatin 80 mg . She said the pill is too big and she can't swallow it sometimes. I asked her if she wanted Crestor because its a small pill.  She agreed but what milligram is equivalent?

## 2022-06-30 ENCOUNTER — Other Ambulatory Visit (INDEPENDENT_AMBULATORY_CARE_PROVIDER_SITE_OTHER): Payer: Self-pay | Admitting: Physician Assistant

## 2022-06-30 ENCOUNTER — Encounter (INDEPENDENT_AMBULATORY_CARE_PROVIDER_SITE_OTHER): Payer: Self-pay | Admitting: Physician Assistant

## 2022-06-30 MED ORDER — ROSUVASTATIN 10 MG TABLET
10.0000 mg | ORAL_TABLET | Freq: Every evening | ORAL | 1 refills | Status: DC
Start: 2022-06-30 — End: 2023-02-02

## 2022-07-01 ENCOUNTER — Other Ambulatory Visit (INDEPENDENT_AMBULATORY_CARE_PROVIDER_SITE_OTHER): Payer: Self-pay | Admitting: Physician Assistant

## 2022-07-01 NOTE — Telephone Encounter (Signed)
Patient called and stated that the Tylenol #3 that you called in on 06/16/22 really helped with her joint pain and she would like a refill. Patient states that she has been in bad pain for the past 4 days and the Tylenol 3 helped with the pain. Her next appointment is 07/22/22.

## 2022-07-06 ENCOUNTER — Other Ambulatory Visit: Payer: Self-pay

## 2022-07-07 MED ORDER — ACETAMINOPHEN 300 MG-CODEINE 30 MG TABLET
1.0000 | ORAL_TABLET | Freq: Four times a day (QID) | ORAL | 0 refills | Status: DC | PRN
Start: 2022-07-07 — End: 2022-07-22

## 2022-07-07 NOTE — Telephone Encounter (Signed)
RX approved and encounter closed

## 2022-07-09 ENCOUNTER — Telehealth (INDEPENDENT_AMBULATORY_CARE_PROVIDER_SITE_OTHER): Payer: Self-pay | Admitting: Physician Assistant

## 2022-07-09 NOTE — Telephone Encounter (Signed)
Patient called wanting tylenol #3 sent in for knee pain. She stated it helped a lot the last time .

## 2022-07-22 ENCOUNTER — Ambulatory Visit: Payer: Medicare Other | Attending: Physician Assistant | Admitting: Physician Assistant

## 2022-07-22 ENCOUNTER — Other Ambulatory Visit: Payer: Self-pay

## 2022-07-22 ENCOUNTER — Encounter (INDEPENDENT_AMBULATORY_CARE_PROVIDER_SITE_OTHER): Payer: Self-pay | Admitting: Physician Assistant

## 2022-07-22 VITALS — BP 120/86 | HR 61 | Temp 96.0°F | Ht 60.0 in

## 2022-07-22 DIAGNOSIS — M1990 Unspecified osteoarthritis, unspecified site: Secondary | ICD-10-CM

## 2022-07-22 DIAGNOSIS — F32A Depression, unspecified: Secondary | ICD-10-CM | POA: Insufficient documentation

## 2022-07-22 DIAGNOSIS — M79605 Pain in left leg: Secondary | ICD-10-CM | POA: Insufficient documentation

## 2022-07-22 DIAGNOSIS — G510 Bell's palsy: Secondary | ICD-10-CM | POA: Insufficient documentation

## 2022-07-22 DIAGNOSIS — I10 Essential (primary) hypertension: Secondary | ICD-10-CM

## 2022-07-22 DIAGNOSIS — M79604 Pain in right leg: Secondary | ICD-10-CM | POA: Insufficient documentation

## 2022-07-22 DIAGNOSIS — F419 Anxiety disorder, unspecified: Secondary | ICD-10-CM | POA: Insufficient documentation

## 2022-07-22 MED ORDER — ERGOCALCIFEROL (VITAMIN D2) 1,250 MCG (50,000 UNIT) CAPSULE
50000.0000 [IU] | ORAL_CAPSULE | ORAL | 2 refills | Status: DC
Start: 2022-07-22 — End: 2022-08-25

## 2022-07-22 MED ORDER — LORAZEPAM 2 MG TABLET
2.0000 mg | ORAL_TABLET | Freq: Every evening | ORAL | 2 refills | Status: DC | PRN
Start: 2022-07-22 — End: 2022-09-21

## 2022-07-22 MED ORDER — ACETAMINOPHEN 300 MG-CODEINE 30 MG TABLET
1.0000 | ORAL_TABLET | Freq: Four times a day (QID) | ORAL | 0 refills | Status: DC | PRN
Start: 2022-07-22 — End: 2022-08-25

## 2022-07-22 MED ORDER — FUROSEMIDE 40 MG TABLET
40.0000 mg | ORAL_TABLET | Freq: Every day | ORAL | 3 refills | Status: DC | PRN
Start: 2022-07-22 — End: 2023-02-02

## 2022-07-22 MED ORDER — NEBIVOLOL 10 MG TABLET
10.0000 mg | ORAL_TABLET | Freq: Every day | ORAL | 1 refills | Status: DC
Start: 2022-07-22 — End: 2022-10-14

## 2022-07-22 MED ORDER — ACETAMINOPHEN 300 MG-CODEINE 30 MG TABLET
1.0000 | ORAL_TABLET | Freq: Four times a day (QID) | ORAL | 0 refills | Status: DC | PRN
Start: 2022-07-22 — End: 2022-07-22

## 2022-07-22 NOTE — Progress Notes (Incomplete)
INTERNAL MEDICINE, BUILDING A  [address]  [place] [address]  Operated by [facility]  History and Physical     Name: Christine Mcmillan MRN:  [id number]   Date: 07/22/2022 Age: 76 y.o.               Follow Up        Reason for Visit: Follow Up (1 month follow up pt has leg and knee pain) follow-up Bell's palsy     History of Present Illness  Christine Mcmillan is a 76 y.o. female who is being seen today in office for follow-up concerning recent Bell's palsy.  Patient once again had 3 ER visits due to  Bell's paralysis and continues to note the RT facial droop with watering of her eyes and pain on the side of her right scalp into the posterior area right head.    No vision complaints noted and pt seen by Dr Jacki Cones this am who stated her eye looked good and will f/u 6 weeks.  Drooping of RT eye is some better but still bothersome.  Patient has also been referred to neurologist.    F/U chol w recent labs showing chol 244  [phone] and LDL 158. Pt taking Pravachol 80 mg nightly as well as Zetia 10 mg nightly but admits to missing medication frequently.  Discussion today of compliance with diet made.  Patient states she will do better with taking her medication.      Follow-up chronic anxiety/insomnia for which patient uses Ativan 2 mg nightly.  Patient has went back and forth from several medications including Xanax Valium trazodone and still has issues sleeping as well as anxiety specifically in the evenings.  She often states that she sleeps 1-2 hours in 3-4 days.  It has been recommended she see a sleep specialist numerous times but due to her insurance issues she has refused.  Most recently states she did get a appointment since getting Medicare and will comply with follow-up.  Request refill of Ativan.      PHQ Questionnaire         Past Medical History:   Diagnosis Date   . Abnormal renal function    . Anxiety    . Arthralgia    . Bell's palsy    . Chronic hypokalemia    . COVID-19  vaccine series completed    . Depression    . Esophageal reflux    . History of memory loss    . History of recurrent UTIs    . Hypertension          Past Surgical History:   Procedure Laterality Date   . BRAIN TUMOR EXCISION     . HX LIPOMA RESECTION N/A     Lipoma on back      Family Medical History:       Problem Relation (Age of Onset)    Cancer Mother            Social History     Tobacco Use   . Smoking status: Never   . Smokeless tobacco: Never   Substance Use Topics   . Alcohol use: Never   . Drug use: Never     Medication:  amLODIPine (NORVASC) 5 mg Oral Tablet, Take 1 Tablet (5 mg total) by mouth Once a day  ergocalciferol, vitamin D2, (DRISDOL) 1,250 mcg (50,000 unit) Oral Capsule, Take 1 Capsule (50,000 Units total) by mouth Every 7 days  ezetimibe (ZETIA) 10  mg Oral Tablet, Take 1 Tablet (10 mg total) by mouth Every evening  furosemide (LASIX) 40 mg Oral Tablet, Take 1 Tablet (40 mg total) by mouth Once per day as needed for Other (Edema)  LORazepam (ATIVAN) 2 mg Oral Tablet, Take 1 Tablet (2 mg total) by mouth Every night as needed for Anxiety  NALOXONE 4 MG/ACTUATION NASAL SPRAY, 1 Spray by INTRANASAL route Every 2 minutes as needed for actual or suspected opioid overdose. Call 911 if used.  nebivoloL (BYSTOLIC) 10 mg Oral Tablet, Take 1 Tablet (10 mg total) by mouth Once a day  rizatriptan (MAXALT-MLT) 5 mg Oral Tablet, Rapid Dissolve, Take 1 Tablet (5 mg total) by mouth Once, as needed May repeat in 2 hours if needed.  rosuvastatin (CRESTOR) 10 mg Oral Tablet, Take 1 Tablet (10 mg total) by mouth Every evening  traZODone (DESYREL) 50 mg Oral Tablet, Take 1 Tablet (50 mg total) by mouth Every night    No facility-administered medications prior to visit.    Allergies:  Allergies   Allergen Reactions   . Acetic Acid-Aluminum Acetate Swelling   . Sertraline  Other Adverse Reaction (Add comment)     Headache         Physical Exam:  Vitals:    07/22/22 1145   BP: 120/86   Pulse: 61   Temp: (!) 35.6 C (96  F)   SpO2: 96%   Height: 1.524 m (5')      Physical Exam  Vitals and nursing note reviewed.   HENT:      Head: Atraumatic.      Comments: RT sided facial droop noted w facial weakness noted however improved     Right Ear: Tympanic membrane normal.      Left Ear: Tympanic membrane normal.      Mouth/Throat:      Mouth: Mucous membranes are moist.      Pharynx: Oropharynx is clear.   Cardiovascular:      Rate and Rhythm: Normal rate and regular rhythm.      Pulses: Normal pulses.   Pulmonary:      Effort: Pulmonary effort is normal.      Breath sounds: Normal breath sounds.   Abdominal:      General: Bowel sounds are normal.      Palpations: Abdomen is soft.      Tenderness: There is no abdominal tenderness.   Musculoskeletal:         General: No swelling. Normal range of motion.      Cervical back: Normal range of motion and neck supple.   Skin:     General: Skin is warm.      Findings: No lesion or rash.   Neurological:      General: No focal deficit present.      Mental Status: She is alert and oriented to person, place, and time.      Cranial Nerves: No cranial nerve deficit.      Sensory: No sensory deficit.      Motor: No weakness.      Gait: Gait normal.   Psychiatric:         Mood and Affect: Mood normal.         Behavior: Behavior normal.         Thought Content: Thought content normal.         Judgment: Judgment normal.       Assessment/Plan:  Problem List Items Addressed This Visit    None  Patient's right eye was taped up today with instructions for her to do at home as needed  Continue follow-up with Dr. Hessie Knows in 6 weeks as scheduled  Continue follow-up with neurologist as scheduled  Patient has refused physical therapy for facial weakness  To take cholesterol medications as directed  Continue diet exercise weight management  Ativan refilled as noted  F/u otherwise as scheduled       Follow up:  Post-Discharge Follow Up Appointments       Thursday Jul 22, 2022    Return Patient Visit with Eyvonne Mechanic, PA-C at 11:00 AM      Wednesday Jul 28, 2022    NEW PATIENT VISIT with Eyvonne Mechanic, PA-C at  1:00 PM      Tuesday Aug 10, 2022    Appointment with Eyvonne Mechanic, PA-C at 11:30 AM      Thursday Aug 19, 2022    Return Patient Visit with Eyvonne Mechanic, PA-C at  2:30 PM      Tuesday Sep 21, 2022    Return Patient Visit with Eyvonne Mechanic, PA-C at  1:00 PM      Wednesday Oct 20, 2022    Return Patient Visit with Eyvonne Mechanic, PA-C at  1:00 PM      Thursday Nov 18, 2022    Return Patient Visit with Eyvonne Mechanic, PA-C at  1:30 PM      Internal Medicine, Building A  [facility]  [address]  [place] [address]  [phone]         Seek medical attention for new or worsening symptoms.  Patient has been seen in this clinic within the last 3 years.     Jaylon Boylen, PA-C          This note was partially created using MModal Fluency Direct system (voice recognition software) and is inherently subject to errors including those of syntax and "sound-alike" substitutions which may escape proofreading.  In such instances, original meaning may be extrapolated by contextual derivation.

## 2022-07-27 IMAGING — MR MRI BRAIN W/ IAC W/O CONTRAST
10 of 11 series · 40 of 48 positions shown · non-contrast
Comparison: None available.

﻿EXAM:  MRI BRAIN W/ IAC W/O CONTRAST
INDICATION: Bells palsy, headaches, slurred speech, right facial weakness, bilateral hearing loss.
TECHNIQUE: Noncontrast multiplanar, multisequence MRI was performed.

[Series 6: DWI · axial · 5.0mm · 1.35mm/px · z∈[-50,+76]mm · 14 of 88 slices shown (1 of 3)]
[im 1/88]
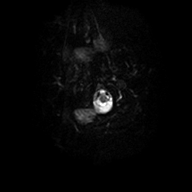
[im 7/88]
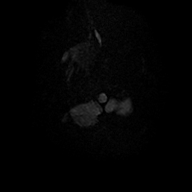
[im 14/88]
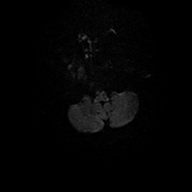
[im 21/88]
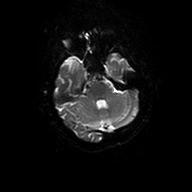
[im 27/88]
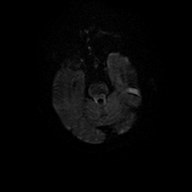
[im 34/88]
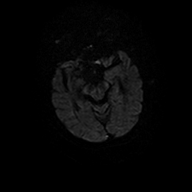
[im 41/88]
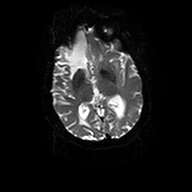
[im 47/88]
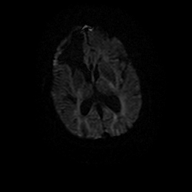
[im 54/88]
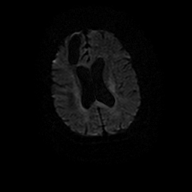
[im 61/88]
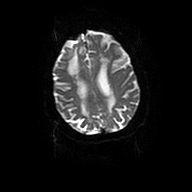
[im 67/88]
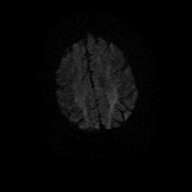
[im 74/88]
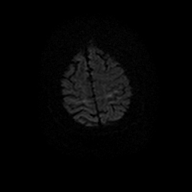
[im 81/88]
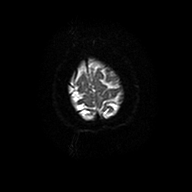
[im 88/88]
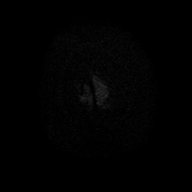

[Series 7: DWI · axial · 5.0mm · 1.35mm/px · z∈[-50,+76]mm · 3 of 22 slices shown (2 of 3)]
[im 1/22]
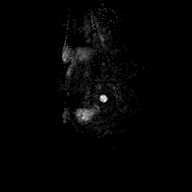
[im 11/22]
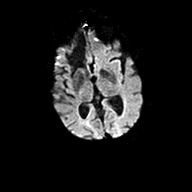
[im 22/22]
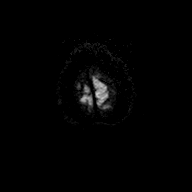

[Series 8: DWI · axial · 5.0mm · 1.35mm/px · z∈[-50,+76]mm · 3 of 22 slices shown (3 of 3)]
[im 1/22]
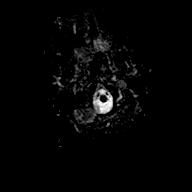
[im 11/22]
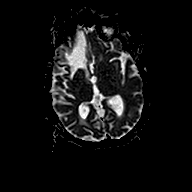
[im 22/22]
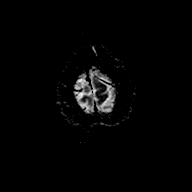

[Series 9: FLAIR · sagittal · 4.0mm · 0.75mm/px · 4 of 26 slices shown (1 of 2)]
[im 1/26]
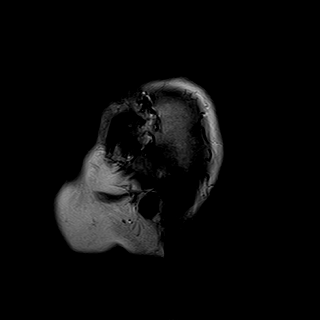
[im 9/26]
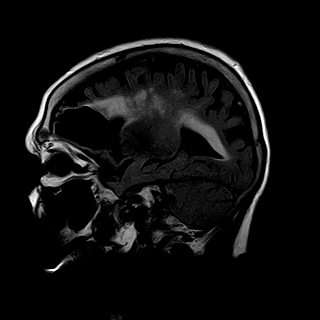
[im 17/26]
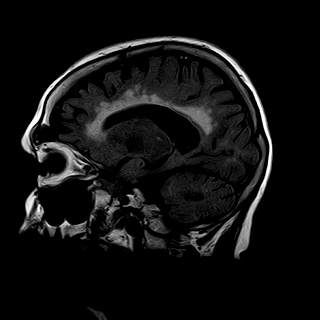
[im 26/26]
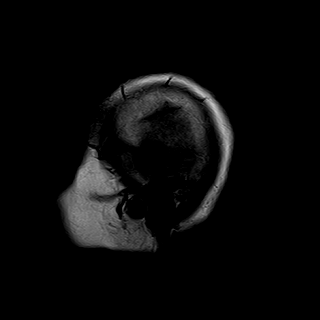

[Series 10: T2 · axial · 5.0mm · 0.57mm/px · z∈[-61,+65]mm · 3 of 22 slices shown]
[im 1/22]
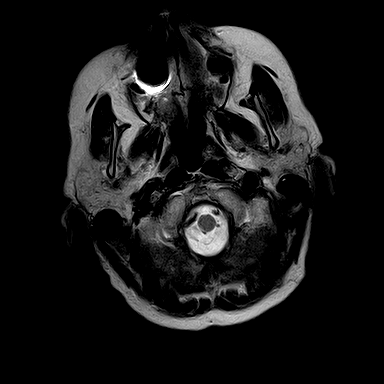
[im 11/22]
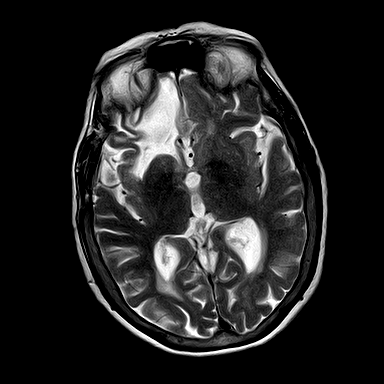
[im 22/22]
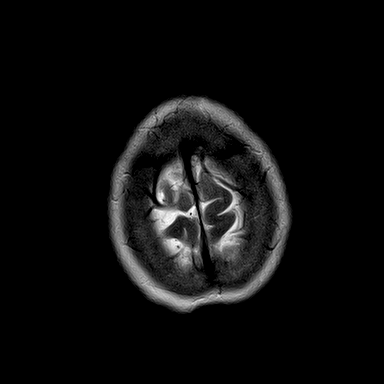

[Series 11: FLAIR · axial · 5.0mm · 0.76mm/px · z∈[-61,+65]mm · 3 of 22 slices shown (2 of 2)]
[im 1/22]
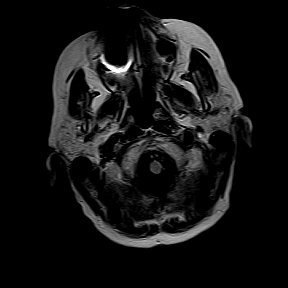
[im 11/22]
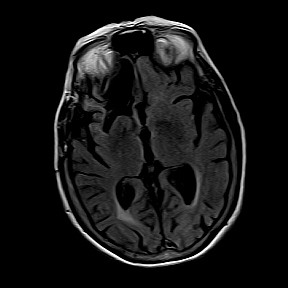
[im 22/22]
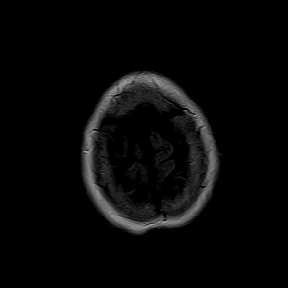

[Series 12: T1 · axial · 5.0mm · 0.69mm/px · z∈[-61,+65]mm · 3 of 22 slices shown (1 of 3)]
[im 1/22]
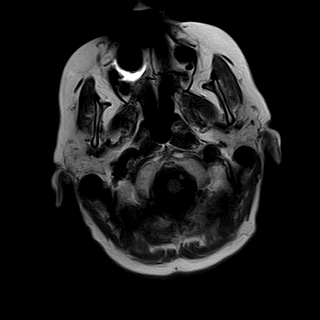
[im 11/22]
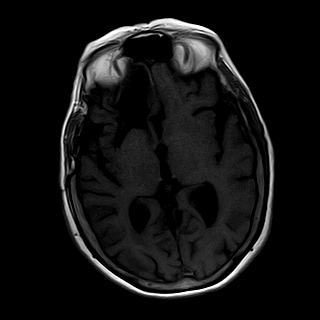
[im 22/22]
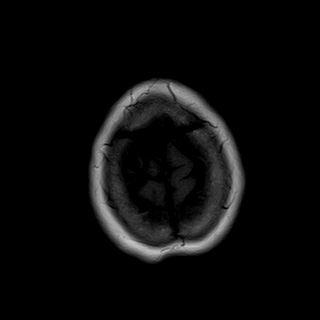

[Series 13: T2-star · axial · 5.0mm · 0.69mm/px · z∈[-61,+65]mm · 3 of 22 slices shown]
[im 1/22]
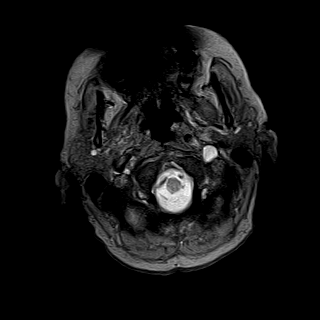
[im 11/22]
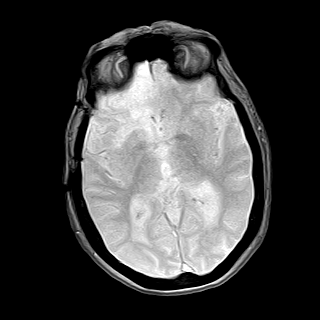
[im 22/22]
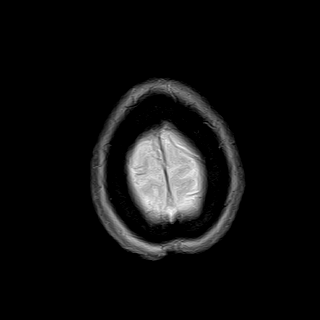

[Series 15: T1 · axial · 3.0mm · 0.47mm/px · z∈[-50,-17]mm · 2 of 11 slices shown (2 of 3)]
[im 1/11]
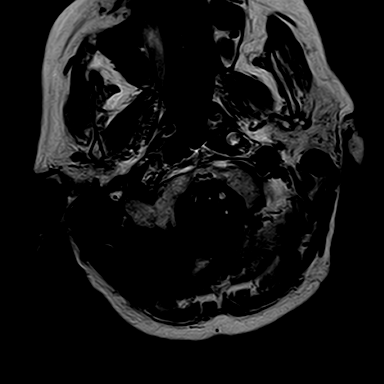
[im 11/11]
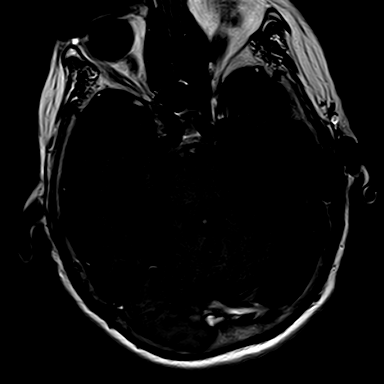

[Series 16: T1 · coronal · 3.0mm · 0.47mm/px · 2 of 11 slices shown (3 of 3)]
[im 1/11]
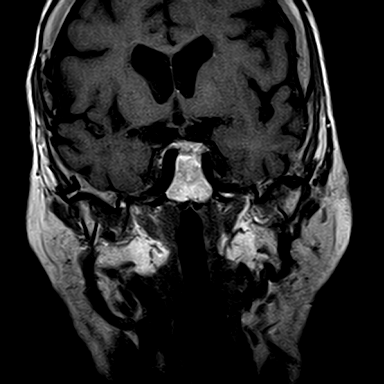
[im 11/11]
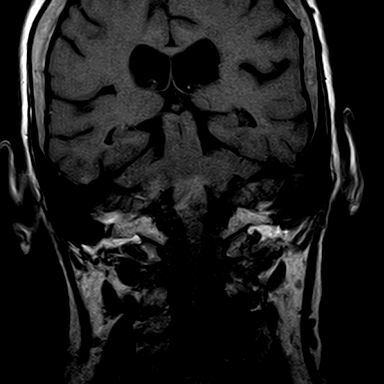

[40 of 48 positions shown; findings below may reference images not displayed]

FINDINGS: Postsurgical changes are noted with right frontal encephalomalacia.  

There is moderate chronic small vessel ischemic change.  There is mild atrophy with mild ventricular dilatation and prominence of the cortical sulci.

There is no evidence of mass effect, acute hemorrhage, shift of the midline structures, or extra-axial fluid collection.  

No relevant prior examinations are available at this time for comparison. 

The paranasal sinuses and mastoid air cells appear clear.  

There is a small left cerebellar lesion that probably represents a small focus of chronic ischemia.  The internal auditory canals appear within normal limits.
IMPRESSION: 1. Postsurgical changes with right frontal encephalomalacia.

2. Mild atrophy.

3. Moderate chronic small vessel ischemic change. 

4. No acute abnormality is seen.

## 2022-07-28 ENCOUNTER — Ambulatory Visit (INDEPENDENT_AMBULATORY_CARE_PROVIDER_SITE_OTHER): Payer: Medicare Other | Admitting: Physician Assistant

## 2022-08-10 ENCOUNTER — Telehealth (INDEPENDENT_AMBULATORY_CARE_PROVIDER_SITE_OTHER): Payer: Self-pay | Admitting: Physician Assistant

## 2022-08-11 NOTE — Telephone Encounter (Signed)
Left message for patient to call back  

## 2022-08-11 NOTE — Telephone Encounter (Signed)
Patient states she only  7 days with and did not get any more

## 2022-08-12 NOTE — Telephone Encounter (Signed)
Per phamacy states   she pick up yesterday at 4 09  she got a 15 day supply  #60   one pod Q6 hr prn.

## 2022-08-15 DIAGNOSIS — M79605 Pain in left leg: Secondary | ICD-10-CM | POA: Insufficient documentation

## 2022-08-15 DIAGNOSIS — M1990 Unspecified osteoarthritis, unspecified site: Secondary | ICD-10-CM | POA: Insufficient documentation

## 2022-08-19 ENCOUNTER — Ambulatory Visit (INDEPENDENT_AMBULATORY_CARE_PROVIDER_SITE_OTHER): Payer: Medicare Other | Admitting: Physician Assistant

## 2022-08-25 ENCOUNTER — Encounter (INDEPENDENT_AMBULATORY_CARE_PROVIDER_SITE_OTHER): Payer: Self-pay | Admitting: Physician Assistant

## 2022-08-25 ENCOUNTER — Other Ambulatory Visit: Payer: Self-pay

## 2022-08-25 ENCOUNTER — Ambulatory Visit: Payer: Medicare Other | Attending: Physician Assistant | Admitting: Physician Assistant

## 2022-08-25 VITALS — BP 118/60 | HR 56 | Temp 97.6°F | Ht 60.0 in | Wt 164.4 lb

## 2022-08-25 DIAGNOSIS — E785 Hyperlipidemia, unspecified: Secondary | ICD-10-CM | POA: Insufficient documentation

## 2022-08-25 DIAGNOSIS — M25562 Pain in left knee: Secondary | ICD-10-CM | POA: Insufficient documentation

## 2022-08-25 DIAGNOSIS — R319 Hematuria, unspecified: Secondary | ICD-10-CM | POA: Insufficient documentation

## 2022-08-25 DIAGNOSIS — M25561 Pain in right knee: Secondary | ICD-10-CM | POA: Insufficient documentation

## 2022-08-25 DIAGNOSIS — M25551 Pain in right hip: Secondary | ICD-10-CM | POA: Insufficient documentation

## 2022-08-25 DIAGNOSIS — Z780 Asymptomatic menopausal state: Secondary | ICD-10-CM | POA: Insufficient documentation

## 2022-08-25 DIAGNOSIS — F32A Depression, unspecified: Secondary | ICD-10-CM | POA: Insufficient documentation

## 2022-08-25 DIAGNOSIS — F419 Anxiety disorder, unspecified: Secondary | ICD-10-CM | POA: Insufficient documentation

## 2022-08-25 DIAGNOSIS — Z91199 Patient's noncompliance with other medical treatment and regimen due to unspecified reason: Secondary | ICD-10-CM | POA: Insufficient documentation

## 2022-08-25 DIAGNOSIS — M25552 Pain in left hip: Secondary | ICD-10-CM | POA: Insufficient documentation

## 2022-08-25 DIAGNOSIS — R5382 Chronic fatigue, unspecified: Secondary | ICD-10-CM | POA: Insufficient documentation

## 2022-08-25 DIAGNOSIS — R350 Frequency of micturition: Secondary | ICD-10-CM | POA: Insufficient documentation

## 2022-08-25 DIAGNOSIS — G8929 Other chronic pain: Secondary | ICD-10-CM | POA: Insufficient documentation

## 2022-08-25 DIAGNOSIS — I10 Essential (primary) hypertension: Secondary | ICD-10-CM | POA: Insufficient documentation

## 2022-08-25 LAB — COMPREHENSIVE METABOLIC PANEL, NON-FASTING
ALBUMIN/GLOBULIN RATIO: 1.7 — ABNORMAL HIGH (ref 0.8–1.4)
ALBUMIN: 4.8 g/dL (ref 3.5–5.7)
ALKALINE PHOSPHATASE: 62 U/L (ref 34–104)
ALT (SGPT): 14 U/L (ref 7–52)
ANION GAP: 10 mmol/L (ref 4–13)
AST (SGOT): 21 U/L (ref 13–39)
BILIRUBIN TOTAL: 0.5 mg/dL (ref 0.3–1.2)
BUN/CREA RATIO: 13 (ref 6–22)
BUN: 12 mg/dL (ref 7–25)
CALCIUM, CORRECTED: 9 mg/dL (ref 8.9–10.8)
CALCIUM: 9.8 mg/dL (ref 8.6–10.3)
CHLORIDE: 101 mmol/L (ref 98–107)
CO2 TOTAL: 26 mmol/L (ref 21–31)
CREATININE: 0.94 mg/dL (ref 0.60–1.30)
ESTIMATED GFR: 62 mL/min/{1.73_m2} (ref >59)
GLOBULIN: 2.9 (ref 2.9–5.4)
GLUCOSE: 140 mg/dL — ABNORMAL HIGH (ref 74–109)
OSMOLALITY, CALCULATED: 276 mOsm/kg (ref 270–290)
POTASSIUM: 3.8 mmol/L (ref 3.5–5.1)
PROTEIN TOTAL: 7.7 g/dL (ref 6.4–8.9)
SODIUM: 137 mmol/L (ref 136–145)

## 2022-08-25 LAB — LIPID PANEL
CHOL/HDL RATIO: 5.1
CHOLESTEROL: 245 mg/dL — ABNORMAL HIGH (ref <200)
HDL CHOL: 48 mg/dL (ref 23–92)
LDL CALC: 155 mg/dL — ABNORMAL HIGH (ref 0–100)
TRIGLYCERIDES: 210 mg/dL — ABNORMAL HIGH (ref <=150)
VLDL CALC: 42 mg/dL (ref 0–50)

## 2022-08-25 MED ORDER — ACETAMINOPHEN 300 MG-CODEINE 30 MG TABLET
1.0000 | ORAL_TABLET | Freq: Four times a day (QID) | ORAL | 0 refills | Status: DC | PRN
Start: 2022-08-25 — End: 2022-09-20

## 2022-08-25 MED ORDER — CYANOCOBALAMIN (VIT B-12) 1,000 MCG/ML INJECTION SOLUTION
1000.0000 ug | Freq: Once | INTRAMUSCULAR | Status: AC
Start: 2022-08-25 — End: 2022-08-25
  Administered 2022-08-25: 1000 ug via INTRAMUSCULAR

## 2022-08-25 MED ORDER — ERGOCALCIFEROL (VITAMIN D2) 1,250 MCG (50,000 UNIT) CAPSULE
50000.0000 [IU] | ORAL_CAPSULE | ORAL | 2 refills | Status: DC
Start: 2022-08-25 — End: 2022-12-08

## 2022-08-25 NOTE — Progress Notes (Signed)
INTERNAL MEDICINE, BUILDING A  [address]  [place] [address]  Operated by [facility]  History and Physical     Name: Christine Mcmillan MRN:  [id number]   Date: 08/25/2022 Age: 76 y.o.               Follow Up        Reason for Visit: Medication follow up (Folow up on medication) needs labs; urine checked;  refills    History of Present Illness  Christine Mcmillan is a 76 y.o. female who is being seen today in office for follow-up however complaining of urinary freq w occ pressure. +hx of UTI noted in past but denies any burning w urination fever chills or abd pain.    F/U BP/HTN w eading today 118/60.  Pt is currently taking Norvasc 5mg  po daily along w bystolic 10 mg daily. She denies CP headaches dizziness.    F/U chol w recent labs showing chol 244  Trig177 and LDL 158. Pt taking Pravachol 80 mg nightly as well as Zetia 10 mg nightly but had admitted to missing medication frequently. States she is now taking night even though.  Hx of poor compliance with diet noted.      F/u chronic joint pain/djd w hx of arthritis noted. Pt was given tylenol #3 and states it def helps her joint pain but does not completely take it away. She's denies any SE or sedation from the med. Needs refill today.    Follow-up chronic anxiety/insomnia for which patient uses Ativan 2 mg nightly.  Patient has went back and forth from several medications including Xanax Valium trazodone and still has issues sleeping as well as anxiety specifically in the evenings.  She was taking Lexapro daily and referred to neurologist who recommended she stopit and try Effexor which may help her sleeping but pt states it has not helped yet.  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    +chronic fatigue issues noted w pt having B12 inject in the past which helps and would like to get another today    Continues to f/u w Dr Jacki Cones concerning cataracts    Referred to Dr Posey Pronto  also concerning her GERD issues as well as colonoscopy but pt did not go to appt and wants to wait at this time    Refuses flu vac      PHQ Questionnaire         Past Medical History:   Diagnosis Date    Abnormal renal function     Anxiety     Arthralgia     Bell's palsy     Chronic hypokalemia     COVID-19 vaccine series completed     Depression     Esophageal reflux     History of memory loss     History of recurrent UTIs     Hypertension          Past Surgical History:   Procedure Laterality Date    BRAIN TUMOR EXCISION      HX LIPOMA RESECTION N/A     Lipoma on back      Family Medical History:       Problem Relation (Age of Onset)    Cancer Mother            Social History     Tobacco Use    Smoking status: Never    Smokeless tobacco: Never  Substance Use Topics    Alcohol use: Never    Drug use: Never     Medication:  amLODIPine (NORVASC) 5 mg Oral Tablet, Take 1 Tablet (5 mg total) by mouth Once a day  ezetimibe (ZETIA) 10 mg Oral Tablet, Take 1 Tablet (10 mg total) by mouth Every evening  furosemide (LASIX) 40 mg Oral Tablet, Take 1 Tablet (40 mg total) by mouth Once per day as needed for Other (Edema)  LORazepam (ATIVAN) 2 mg Oral Tablet, Take 1 Tablet (2 mg total) by mouth Every night as needed for Anxiety  NALOXONE 4 MG/ACTUATION NASAL SPRAY, 1 Spray by INTRANASAL route Every 2 minutes as needed for actual or suspected opioid overdose. Call 911 if used.  nebivoloL (BYSTOLIC) 10 mg Oral Tablet, Take 1 Tablet (10 mg total) by mouth Once a day  rizatriptan (MAXALT-MLT) 5 mg Oral Tablet, Rapid Dissolve, Take 1 Tablet (5 mg total) by mouth Once, as needed May repeat in 2 hours if needed.  rosuvastatin (CRESTOR) 10 mg Oral Tablet, Take 1 Tablet (10 mg total) by mouth Every evening  traZODone (DESYREL) 50 mg Oral Tablet, Take 1 Tablet (50 mg total) by mouth Every night  acetaminophen-codeine (TYLENOL #3) 300-30 mg Oral Tablet, Take 1 Tablet by mouth Every 6 hours as needed  ergocalciferol, vitamin D2, (DRISDOL)  1,250 mcg (50,000 unit) Oral Capsule, Take 1 Capsule (50,000 Units total) by mouth Every 7 days    No facility-administered medications prior to visit.    Allergies:  Allergies   Allergen Reactions    Acetic Acid-Aluminum Acetate Swelling    Sertraline  Other Adverse Reaction (Add comment)     Headache         Physical Exam:  Vitals:    08/25/22 1339   BP: 118/60   Pulse: 56   Temp: 36.4 C (97.6 F)   TempSrc: Tympanic   SpO2: 97%   Weight: 74.6 kg (164 lb 6.4 oz)   Height: 1.524 m (5')   BMI: 32.17      Physical Exam  Vitals and nursing note reviewed.   HENT:      Head: Atraumatic.      Right Ear: Tympanic membrane normal.      Left Ear: Tympanic membrane normal.      Mouth/Throat:      Mouth: Mucous membranes are moist.      Pharynx: Oropharynx is clear.      Comments: +chronic mild RT sided facial weakness due to previous bells however overall improved  Eyes:      Extraocular Movements: Extraocular movements intact.      Comments: +mild ptosis RT eye   Cardiovascular:      Rate and Rhythm: Normal rate and regular rhythm.      Pulses: Normal pulses.   Pulmonary:      Effort: Pulmonary effort is normal.      Breath sounds: Normal breath sounds.   Abdominal:      General: Bowel sounds are normal.      Palpations: Abdomen is soft.      Tenderness: There is abdominal tenderness (mild suprapubic). There is no right CVA tenderness or left CVA tenderness.   Musculoskeletal:         General: No swelling. Normal range of motion.      Cervical back: Normal range of motion and neck supple.      Comments: +DJD bilat hands/knees  +decreased muscle tone all extrem  Gait stable   Skin:  General: Skin is warm.      Findings: No lesion or rash.      Comments: +scattered purpura bilat upper extrem   Neurological:      General: No focal deficit present.      Mental Status: She is alert and oriented to person, place, and time.      Cranial Nerves: No cranial nerve deficit.      Sensory: No sensory deficit.      Motor: No  weakness.      Gait: Gait normal.   Psychiatric:         Mood and Affect: Mood normal.         Behavior: Behavior normal.         Thought Content: Thought content normal.         Judgment: Judgment normal.       Urine Dip Results:   Time collected: (P) 1429  Glucose (Ref Range: Negative mg/dL): (P) Negative  Bilirubin (Ref Range: Negative mg/dL): (P) Negative  Ketones (Ref Range: Negative mg/dL): (!) (P) Trace (5 mg/dl)  Urine Specific Gravity (Ref Range: 1.005 - 1.030): (P) 1.030  Blood (urine) (Ref Range: Negative mg/dL): (!) (P) Trace  pH (Ref Range: 5.0 - 8.0): (P) 5.0  Protein (Ref Range: Negative mg/dL): (!) (P) Trace  Urobilinogen (Ref Range: Negative mg/dL): (P) Normal   Nitrite (Ref Range: Negative): (P) Negative  Leukocytes (Ref Range: Negative WBC's/uL): (P) Negative       Assessment/Plan:  Problem List Items Addressed This Visit          Cardiovascular System    Essential hypertension    Hyperlipidemia    Relevant Orders    COMPREHENSIVE METABOLIC PANEL, NON-FASTING    LIPID PANEL       Musculoskeletal    Chronic arthralgias of knees and hips       Psychiatric    Anxiety and depression    Patient noncompliance, general       Other    Chronic fatigue    Post-menopausal    Relevant Orders    BONE DENSITOMETRY COMPARISON     Other Visit Diagnoses       Urinary frequency    -  Primary    Relevant Orders    POCT URINE DIPSTICK    URINALYSIS, MACROSCOPIC AND MICROSCOPIC W/CULTURE REFLEX    Hematuria, unspecified type        Relevant Orders    URINALYSIS, MACROSCOPIC AND MICROSCOPIC W/CULTURE REFLEX             Labs obtained in office  Urine sent for culture  B12 shot given  Meds refilled as noted   Bone density ordered  Continue follow-up with Dr. Jacki Cones  as scheduled  Continue follow-up with neurologist as scheduled  Continue diet exercise weight management  F/u pending lab results otherwise as scheduled     Post-Discharge Follow Up Appointments       Tuesday Sep 21, 2022    Return Patient Visit with Cleophus Molt, PA-C at  1:00 PM      Wednesday Oct 20, 2022    Return Patient Visit with Cleophus Molt, PA-C at  1:00 PM      Thursday Nov 18, 2022    Return Patient Visit with Cleophus Molt, PA-C at  1:30 PM      Internal Medicine, Building A  [address], [place]  [address]  [place] [address]  [phone]  Seek medical attention for new or worsening symptoms.  Patient has been seen in this clinic within the last 3 years.     Cassity Christian, PA-C          This note was partially created using MModal Fluency Direct system (voice recognition software) and is inherently subject to errors including those of syntax and "sound-alike" substitutions which may escape proofreading.  In such instances, original meaning may be extrapolated by contextual derivation.

## 2022-08-25 NOTE — Nursing Note (Signed)
08/25/22 1400   Urine test  (S[employer] Multistix 10 SG)   Performed Status: Manual   Time collected 1429   Color (Ref Range: Yellow) (!) Red   Clarity (Ref Range: Clear) Clear   Glucose (Ref Range: Negative mg/dL) Negative   Bilirubin (Ref Range: Negative mg/dL) Negative   Ketones (Ref Range: Negative mg/dL) (!) Trace (5 mg/dl)   Urine Specific Gravity (Ref Range: 1.005 - 1.030) 1.030   Blood (urine) (Ref Range: Negative mg/dL) (!) Trace   pH (Ref Range: 5.0 - 8.0) 5.0   Protein (Ref Range: Negative mg/dL) (!) Trace   Urobilinogen (Ref Range: Negative mg/dL) Normal    Nitrite (Ref Range: Negative) Negative   Leukocytes (Ref Range: Negative WBC's/uL) Negative   Culture Sent yes   Initials CL

## 2022-08-26 ENCOUNTER — Telehealth (INDEPENDENT_AMBULATORY_CARE_PROVIDER_SITE_OTHER): Payer: Self-pay | Admitting: Physician Assistant

## 2022-08-31 ENCOUNTER — Other Ambulatory Visit (INDEPENDENT_AMBULATORY_CARE_PROVIDER_SITE_OTHER): Payer: Self-pay | Admitting: Physician Assistant

## 2022-09-01 ENCOUNTER — Other Ambulatory Visit: Payer: Medicare Other

## 2022-09-01 ENCOUNTER — Other Ambulatory Visit: Payer: Self-pay

## 2022-09-01 DIAGNOSIS — G609 Hereditary and idiopathic neuropathy, unspecified: Secondary | ICD-10-CM | POA: Insufficient documentation

## 2022-09-01 DIAGNOSIS — N289 Disorder of kidney and ureter, unspecified: Secondary | ICD-10-CM | POA: Insufficient documentation

## 2022-09-01 LAB — C-REACTIVE PROTEIN (CRP): C-REACTIVE PROTEIN (CRP): 0.2 mg/dL (ref 0.1–0.5)

## 2022-09-03 LAB — HEP-2 SUBSTRATE ANTINUCLEAR ANTIBODIES (ANA), SERUM: ANA INTERPRETATION: NEGATIVE

## 2022-09-03 LAB — LYME ANTIBODY PANEL WITH REFLEX: LYME ANTIBODY TOTAL (Screen): NEGATIVE

## 2022-09-05 LAB — ANGIOTENSIN CONVERTING ENZYME (ACE), SERUM: ANGIOTENSIN CONV ENZYME: 21 U/L (ref 9–67)

## 2022-09-15 ENCOUNTER — Telehealth (INDEPENDENT_AMBULATORY_CARE_PROVIDER_SITE_OTHER): Payer: Self-pay | Admitting: Physician Assistant

## 2022-09-15 ENCOUNTER — Other Ambulatory Visit (INDEPENDENT_AMBULATORY_CARE_PROVIDER_SITE_OTHER): Payer: Self-pay | Admitting: Physician Assistant

## 2022-09-15 MED ORDER — NITROFURANTOIN MONOHYDRATE/MACROCRYSTALS 100 MG CAPSULE
100.0000 mg | ORAL_CAPSULE | Freq: Two times a day (BID) | ORAL | 0 refills | Status: AC
Start: 2022-09-15 — End: 2022-09-22

## 2022-09-16 NOTE — ED Provider Notes (Addendum)
 ED Provider Note    Subjective     Cc: left knee pain    HPI: pt.is pleasant 63 YOF with age related conditions including arthritic of multiple joints specifically knee in past respiring pain meds and benefits from Toradol. Has chonic pain both knees but for past several day left knee pain in medial areas. No fell or twisting, no rend ess or swelling          Objective     Vitals <redacted file path>:  ED Triage Vitals [09/16/22 1742]   BP Pulse Resp Temp SpO2 Flow (L/min) (Oxygen Therapy)   (!) 165/80 69 18 97.9 F (36.6 C) 99 % --     Physical Exam  Constitutional:       Appearance: Normal appearance. She is not ill-appearing.   HENT:      Head: Normocephalic and atraumatic.      Nose: Nose normal.      Mouth/Throat:      Mouth: Mucous membranes are moist.   Eyes:      Extraocular Movements: Extraocular movements intact.      Pupils: Pupils are equal, round, and reactive to light.   Cardiovascular:      Rate and Rhythm: Normal rate and regular rhythm.   Pulmonary:      Effort: No respiratory distress.      Breath sounds: No stridor.   Musculoskeletal:         General: Tenderness present.      Cervical back: Normal range of motion and neck supple.      Comments: Arthritic change both knees, no swelling acute deformity. tenderness medial aspect left knee    Neurological:      Mental Status: She is alert.           Assessment and Plan:         MDM "Risk" in the MDM section refers to billing criteria on potential for complications and/or morbidity/mortality of management as defined by the AMA and CMS  Osteoarthritic by exam and radiographic findings. Will f/u with orthopedic    ED Course <redacted file path>:    improved     Procedures      Clinical Impression <redacted file path>:  1. Primary osteoarthritis of left knee         Disposition <redacted file path>:  home

## 2022-09-20 ENCOUNTER — Other Ambulatory Visit (INDEPENDENT_AMBULATORY_CARE_PROVIDER_SITE_OTHER): Payer: Self-pay | Admitting: Physician Assistant

## 2022-09-21 ENCOUNTER — Other Ambulatory Visit: Payer: Self-pay

## 2022-09-21 ENCOUNTER — Encounter (INDEPENDENT_AMBULATORY_CARE_PROVIDER_SITE_OTHER): Payer: Self-pay | Admitting: Physician Assistant

## 2022-09-21 ENCOUNTER — Ambulatory Visit: Payer: Medicare Other | Attending: Physician Assistant | Admitting: Physician Assistant

## 2022-09-21 VITALS — BP 118/60 | Temp 98.3°F | Wt 158.6 lb

## 2022-09-21 DIAGNOSIS — E785 Hyperlipidemia, unspecified: Secondary | ICD-10-CM | POA: Insufficient documentation

## 2022-09-21 DIAGNOSIS — M25551 Pain in right hip: Secondary | ICD-10-CM | POA: Insufficient documentation

## 2022-09-21 DIAGNOSIS — I10 Essential (primary) hypertension: Secondary | ICD-10-CM | POA: Insufficient documentation

## 2022-09-21 DIAGNOSIS — N390 Urinary tract infection, site not specified: Secondary | ICD-10-CM | POA: Insufficient documentation

## 2022-09-21 DIAGNOSIS — M25561 Pain in right knee: Secondary | ICD-10-CM | POA: Insufficient documentation

## 2022-09-21 DIAGNOSIS — M25562 Pain in left knee: Secondary | ICD-10-CM | POA: Insufficient documentation

## 2022-09-21 DIAGNOSIS — M25552 Pain in left hip: Secondary | ICD-10-CM | POA: Insufficient documentation

## 2022-09-21 DIAGNOSIS — F32A Depression, unspecified: Secondary | ICD-10-CM | POA: Insufficient documentation

## 2022-09-21 DIAGNOSIS — F419 Anxiety disorder, unspecified: Secondary | ICD-10-CM | POA: Insufficient documentation

## 2022-09-21 DIAGNOSIS — G8929 Other chronic pain: Secondary | ICD-10-CM | POA: Insufficient documentation

## 2022-09-21 MED ORDER — EZETIMIBE 10 MG TABLET
10.0000 mg | ORAL_TABLET | Freq: Every evening | ORAL | 5 refills | Status: DC
Start: 2022-09-21 — End: 2022-10-14

## 2022-09-21 MED ORDER — LORAZEPAM 2 MG TABLET
2.0000 mg | ORAL_TABLET | Freq: Every evening | ORAL | 2 refills | Status: DC | PRN
Start: 2022-09-21 — End: 2022-11-10

## 2022-09-21 NOTE — Progress Notes (Signed)
INTERNAL MEDICINE, BUILDING A  [address]  [place] [id number]  [facility]  History and Physical     Name: Christine Mcmillan MRN:  [id number]   Date: 09/21/2022 Age: 76 y.o.               Follow Up        Reason for Visit: Follow Up  urine checked;  refills    History of Present Illness  Christine Mcmillan is a 76 y.o. female who is being seen today in office for follow-up concerning recurrent UTI with last urinalysis showing infection however culture was unremarkable.  Patient states that she was not contacted about the positive urine until several weeks after she had given the samples so she did not take any kind of antibiotic.  She is still having some pressure occasional frequency but no burning pain blood fever chills noted.  Repeat urinalysis needed today.  It is noted patient has been referred to the urologist in the past for recurrent UTIs however was not compliant on keeping appointment.    F/U BP/HTN w eading today 118/60.  Pt is currently taking Norvasc 5mg  po daily along w bystolic 10 mg daily. She denies CP headaches dizziness.    F/U chol w recent labs showing chol 245  Trig210 and LDL 155 and HDL 48. Pt taking Pravachol 80 mg nightly in previously given Zetia 10 mg nightly but had admitted to missing medication frequently we discussed changing her Pravachol to possibly Crestor but at this time she wants to wait and watch her diet a little bit better and take the Zetia nightly.  I will send her noted prescription for Zetia 10 mg nightly.  At this time she denies any side effects or issues with the Pravachol and states she is tried some other statins in the past which did bother her legs.    F/u chronic joint pain/djd w hx of arthritis noted. Pt was given tylenol #3 and states it def helps her joint pain but does not completely take it away. She's denies any SE or sedation from the med.  Needed medication refill which was sent yesterday but she states  pharmacy did not call her to let her know that it was completed so she will check on the refill.    Follow-up chronic anxiety/insomnia for which patient uses Ativan 2 mg nightly.  Patient has went back and forth from several medications including Xanax Valium trazodone and still has issues sleeping as well as anxiety specifically in the evenings.  She was taking Lexapro daily and referred to neurologist who recommended she stopit and try Effexor which may help her sleeping but pt states it has not helped yet.  Dr Aretha Parrotazzaq also following her bell's palsy which is better but weakness of face still noted.  She does tell me the neurologist told her to be very careful about the Ativan making her unsteady and falling which she denies ever having an issue with.    +chronic fatigue issues noted w pt having B12 inject in the past which helps and would like to get another today however we are currently out of B12 injections.    Continues to f/u w Dr Hessie Knowsblaydes concerning cataracts    Referred to Dr Allena KatzPatel also concerning her GERD issues as well as colonoscopy but pt did not go to appt and wants to wait at this time    Refuses flu vac      PHQ  Questionnaire         Past Medical History:   Diagnosis Date    Abnormal renal function     Anxiety     Arthralgia     Bell's palsy     Chronic hypokalemia     COVID-19 vaccine series completed     Depression     Esophageal reflux     History of memory loss     History of recurrent UTIs     Hypertension          Past Surgical History:   Procedure Laterality Date    BRAIN TUMOR EXCISION      HX LIPOMA RESECTION N/A     Lipoma on back      Family Medical History:       Problem Relation (Age of Onset)    Cancer Mother            Social History     Tobacco Use    Smoking status: Never    Smokeless tobacco: Never   Substance Use Topics    Alcohol use: Never    Drug use: Never     Medication:  acetaminophen-codeine (TYLENOL #3) 300-30 mg Oral Tablet, TAKE ONE TABLET BY MOUTH EVERY 6 HOURS AS  NEEDED  amLODIPine (NORVASC) 5 mg Oral Tablet, Take 1 Tablet (5 mg total) by mouth Once a day  ergocalciferol, vitamin D2, (DRISDOL) 1,250 mcg (50,000 unit) Oral Capsule, Take 1 Capsule (50,000 Units total) by mouth Every 7 days  ezetimibe (ZETIA) 10 mg Oral Tablet, Take 1 Tablet (10 mg total) by mouth Every evening  furosemide (LASIX) 40 mg Oral Tablet, Take 1 Tablet (40 mg total) by mouth Once per day as needed for Other (Edema)  NALOXONE 4 MG/ACTUATION NASAL SPRAY, 1 Spray by INTRANASAL route Every 2 minutes as needed for actual or suspected opioid overdose. Call 911 if used.  nebivoloL (BYSTOLIC) 10 mg Oral Tablet, Take 1 Tablet (10 mg total) by mouth Once a day  nitrofurantoin monohyd/m-cryst (MACROBID) 100 mg Oral Capsule, Take 1 Capsule (100 mg total) by mouth Twice daily for 7 days  omeprazole (PRILOSEC) 20 mg Oral Capsule, Delayed Release(E.C.), Take 1 Capsule (20 mg total) by mouth Once a day  rizatriptan (MAXALT-MLT) 5 mg Oral Tablet, Rapid Dissolve, Take 1 Tablet (5 mg total) by mouth Once, as needed May repeat in 2 hours if needed.  rosuvastatin (CRESTOR) 10 mg Oral Tablet, Take 1 Tablet (10 mg total) by mouth Every evening  traZODone (DESYREL) 50 mg Oral Tablet, Take 1 Tablet (50 mg total) by mouth Every night  LORazepam (ATIVAN) 2 mg Oral Tablet, Take 1 Tablet (2 mg total) by mouth Every night as needed for Anxiety    No facility-administered medications prior to visit.    Allergies:  Allergies   Allergen Reactions    Acetic Acid-Aluminum Acetate Swelling    Sertraline  Other Adverse Reaction (Add comment)     Headache         Physical Exam:  Vitals:    09/21/22 1321   BP: 118/60   Temp: 36.8 C (98.3 F)   Weight: 71.9 kg (158 lb 9.6 oz)      Physical Exam  Vitals and nursing note reviewed.   HENT:      Head: Atraumatic.      Right Ear: Tympanic membrane normal.      Left Ear: Tympanic membrane normal.      Mouth/Throat:      Mouth: Mucous  membranes are moist.      Pharynx: Oropharynx is clear.       Comments: +chronic mild RT sided facial weakness due to previous bells however overall improved  Eyes:      Extraocular Movements: Extraocular movements intact.      Comments: +mild ptosis RT eye   Cardiovascular:      Rate and Rhythm: Normal rate and regular rhythm.      Pulses: Normal pulses.   Pulmonary:      Effort: Pulmonary effort is normal.      Breath sounds: Normal breath sounds.   Abdominal:      General: Bowel sounds are normal.      Palpations: Abdomen is soft.      Tenderness: There is no abdominal tenderness. There is no right CVA tenderness or left CVA tenderness.   Musculoskeletal:         General: No swelling. Normal range of motion.      Cervical back: Normal range of motion and neck supple.      Comments: +DJD bilat hands/knees  +decreased muscle tone all extrem  Gait stable   Skin:     General: Skin is warm.      Findings: No lesion or rash.      Comments: +scattered purpura bilat upper extrem   Neurological:      General: No focal deficit present.      Mental Status: She is alert and oriented to person, place, and time.      Cranial Nerves: No cranial nerve deficit.      Sensory: No sensory deficit.      Motor: No weakness.      Gait: Gait normal.   Psychiatric:         Mood and Affect: Mood normal.         Behavior: Behavior normal.         Thought Content: Thought content normal.         Judgment: Judgment normal.       Patient was unable to get urine sample today will return with sample     Assessment/Plan:  Problem List Items Addressed This Visit          Cardiovascular System    Essential hypertension    Hyperlipidemia       Musculoskeletal    Chronic arthralgias of knees and hips       Psychiatric    Anxiety and depression     Other Visit Diagnoses       Recurrent UTI (urinary tract infection)    -  Primary    Relevant Orders    URINALYSIS, MACROSCOPIC AND MICROSCOPIC W/CULTURE REFLEX          Discussed recent lab results with patient today  Once again to returns urine sample in the  meantime will continue with increase hydration proper hygiene  Patient to restart Zetia 10 mg nightly and continue the Pravachol 80 mg nightly along with strict diet weight loss efforts  Meds refilled as noted   Continue follow-up with Dr. Hessie Knows  as scheduled  Continue follow-up with neurologist as scheduled  Continue diet exercise weight management  Will need labs in near future to follow-up concerning cholesterol     Post-Discharge Follow Up Appointments                 Wednesday Oct 20, 2022    Return Patient Visit with Hyman Bible, Gurbani Figge, PA-C at  1:00 PM  Thursday Nov 18, 2022    Return Patient Visit with Eyvonne Mechanic, PA-C at  1:30 PM      Internal Medicine, Building A  [facility]  [address]  [place] [address]  [phone]            Seek medical attention for new or worsening symptoms.  Patient has been seen in this clinic within the last 3 years.     Joron Velis, PA-C          This note was partially created using MModal Fluency Direct system (voice recognition software) and is inherently subject to errors including those of syntax and "sound-alike" substitutions which may escape proofreading.  In such instances, original meaning may be extrapolated by contextual derivation.

## 2022-09-21 NOTE — Nursing Note (Signed)
09/21/22 1324   Recent Weight Change   Have you had a recent unexplained weight loss or gain? Y  (Last visit patient was 89. She states that her appetitite has been poor.)   Domestic Violence   Because we are aware of abuse and domestic violence today, we ask all patients: Are you being hurt, hit, or frightened by anyone at your home or in your life?  N   Basic Needs   Do you have any basic needs within your home that are not being met? (such as Food, Shelter, Games developer, Tranportation, paying for bills and/or medications) N

## 2022-09-21 NOTE — Nursing Note (Signed)
Patient is here for follow up visit. Patient reports that she went to the ER on Friday for left knee pain. She stated that she received a tordol injection which was effective. Patient also reports urinary frequency and urgency. No further concerns verbalized.

## 2022-09-22 ENCOUNTER — Other Ambulatory Visit (INDEPENDENT_AMBULATORY_CARE_PROVIDER_SITE_OTHER): Payer: Self-pay

## 2022-09-22 DIAGNOSIS — N390 Urinary tract infection, site not specified: Secondary | ICD-10-CM

## 2022-10-05 ENCOUNTER — Ambulatory Visit (INDEPENDENT_AMBULATORY_CARE_PROVIDER_SITE_OTHER): Payer: Medicare Other | Admitting: Physician Assistant

## 2022-10-14 ENCOUNTER — Ambulatory Visit: Payer: Medicare Other | Attending: Physician Assistant | Admitting: Physician Assistant

## 2022-10-14 ENCOUNTER — Other Ambulatory Visit: Payer: Self-pay

## 2022-10-14 ENCOUNTER — Encounter (INDEPENDENT_AMBULATORY_CARE_PROVIDER_SITE_OTHER): Payer: Self-pay | Admitting: Physician Assistant

## 2022-10-14 ENCOUNTER — Ambulatory Visit (INDEPENDENT_AMBULATORY_CARE_PROVIDER_SITE_OTHER): Payer: Medicare Other | Admitting: Physician Assistant

## 2022-10-14 VITALS — BP 158/88 | HR 55 | Ht 60.0 in | Wt 160.0 lb

## 2022-10-14 DIAGNOSIS — M1990 Unspecified osteoarthritis, unspecified site: Secondary | ICD-10-CM

## 2022-10-14 DIAGNOSIS — F32A Depression, unspecified: Secondary | ICD-10-CM | POA: Insufficient documentation

## 2022-10-14 DIAGNOSIS — M25561 Pain in right knee: Secondary | ICD-10-CM

## 2022-10-14 DIAGNOSIS — B9620 Unspecified Escherichia coli [E. coli] as the cause of diseases classified elsewhere: Secondary | ICD-10-CM

## 2022-10-14 DIAGNOSIS — M25552 Pain in left hip: Secondary | ICD-10-CM

## 2022-10-14 DIAGNOSIS — M25551 Pain in right hip: Secondary | ICD-10-CM

## 2022-10-14 DIAGNOSIS — R5382 Chronic fatigue, unspecified: Secondary | ICD-10-CM | POA: Insufficient documentation

## 2022-10-14 DIAGNOSIS — G8929 Other chronic pain: Secondary | ICD-10-CM | POA: Insufficient documentation

## 2022-10-14 DIAGNOSIS — F419 Anxiety disorder, unspecified: Secondary | ICD-10-CM

## 2022-10-14 DIAGNOSIS — N390 Urinary tract infection, site not specified: Secondary | ICD-10-CM

## 2022-10-14 DIAGNOSIS — R7309 Other abnormal glucose: Secondary | ICD-10-CM

## 2022-10-14 DIAGNOSIS — I10 Essential (primary) hypertension: Secondary | ICD-10-CM | POA: Insufficient documentation

## 2022-10-14 DIAGNOSIS — M25562 Pain in left knee: Secondary | ICD-10-CM

## 2022-10-14 MED ORDER — EZETIMIBE 10 MG TABLET
10.0000 mg | ORAL_TABLET | Freq: Every evening | ORAL | 5 refills | Status: DC
Start: 2022-10-14 — End: 2023-03-31

## 2022-10-14 MED ORDER — NEBIVOLOL 10 MG TABLET
10.0000 mg | ORAL_TABLET | Freq: Every day | ORAL | 1 refills | Status: DC
Start: 2022-10-14 — End: 2023-03-31

## 2022-10-14 MED ORDER — CYANOCOBALAMIN (VIT B-12) 1,000 MCG/ML INJECTION SOLUTION
1000.0000 ug | Freq: Once | INTRAMUSCULAR | Status: AC
Start: 2022-10-14 — End: 2022-10-14
  Administered 2022-10-14: 1000 ug via INTRAMUSCULAR

## 2022-10-14 MED ORDER — ACETAMINOPHEN 300 MG-CODEINE 30 MG TABLET
1.0000 | ORAL_TABLET | Freq: Four times a day (QID) | ORAL | 0 refills | Status: DC | PRN
Start: 2022-10-14 — End: 2022-11-10

## 2022-10-14 MED ORDER — DICLOFENAC 20 MG/GRAM/ACTUATION (2 %) TOPICAL SOLN METERED-DOSE PUMP
1.0000 | Freq: Two times a day (BID) | CUTANEOUS | 3 refills | Status: DC
Start: 2022-10-14 — End: 2023-08-29

## 2022-10-14 NOTE — Progress Notes (Signed)
INTERNAL MEDICINE, BUILDING A  [address]  [place] [address]  Operated by [facility]  History and Physical     Name: Christine Mcmillan MRN:  [id number]   Date: 10/14/2022 Age: 76 y.o.               Follow Up        Reason for Visit: Follow Up (1 month follow up CDM /) and Medication Refill  urine checked;  refills    History of Present Illness  Christine Mcmillan is a 76 y.o. female who is being seen today in office for follow-up concerning recurrent UTI with last culture showing +ecoli and enterococcus. Pt completed the macrobid and overall feels better but still has some pressure noted. She denies burning pain blood fever chills.  Repeat urinalysis needed today.  It is noted patient has been referred to the urologist in the past for recurrent UTIs however was not compliant on keeping appointment.    She is also complaining of increased bilat knees and hip pain w hx of DJD/arthritis noted. Pt states she is more stiff in the morning and her knees and hips just ache if she does too much during the day. Denies any red hot inflammed joints. Agreeable to ortho referral. She is taking tylenol #3 currently for her pain and states it def helps  but does not completely take it away. She's denies any SE or sedation from the med.     F/U BP/HTN w reading today 158/88.  Pt is currently taking Norvasc 5mg  po daily along w bystolic 10 mg daily. She overall feels ok and denies CP headaches dizziness.    Follow-up chronic anxiety/insomnia for which patient uses Ativan 2 mg nightly.  Patient has went back and forth from several medications including Xanax Valium trazodone and still has issues sleeping as well as anxiety specifically in the evenings.  She is also taking Effexor 75 mg daily for her depression hx and feels like med has seemed to help. She previously was taking Lexapro.  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  She does tell me  the neurologist told her to be very careful about the Ativan making her unsteady and falling which she denies ever having an issue with.    +chronic fatigue issues noted w pt having B12 inject in the past which helps and would like to get another today however we are currently out of B12 injections.    Continues to f/u w [name] concerning cataracts    Referred to Dr Hessie Knows also concerning her GERD issues as well as colonoscopy but pt did not go to appt and wants to wait at this time    Refuses flu vac      PHQ Questionnaire         Past Medical History:   Diagnosis Date    Abnormal renal function     Anxiety     Arthralgia     Bell's palsy     Chronic hypokalemia     COVID-19 vaccine series completed     Depression     Esophageal reflux     History of memory loss     History of recurrent UTIs     Hypertension          Past Surgical History:   Procedure Laterality Date    BRAIN TUMOR EXCISION      HX LIPOMA RESECTION N/A  Lipoma on back      Family Medical History:       Problem Relation (Age of Onset)    Cancer Mother            Social History     Tobacco Use    Smoking status: Never    Smokeless tobacco: Never   Substance Use Topics    Alcohol use: Never    Drug use: Never     Medication:  amLODIPine (NORVASC) 5 mg Oral Tablet, Take 1 Tablet (5 mg total) by mouth Once a day  ergocalciferol, vitamin D2, (DRISDOL) 1,250 mcg (50,000 unit) Oral Capsule, Take 1 Capsule (50,000 Units total) by mouth Every 7 days  ezetimibe (ZETIA) 10 mg Oral Tablet, Take 1 Tablet (10 mg total) by mouth Every evening  furosemide (LASIX) 40 mg Oral Tablet, Take 1 Tablet (40 mg total) by mouth Once per day as needed for Other (Edema)  LORazepam (ATIVAN) 2 mg Oral Tablet, Take 1 Tablet (2 mg total) by mouth Every night as needed for Anxiety  NALOXONE 4 MG/ACTUATION NASAL SPRAY, 1 Spray by INTRANASAL route Every 2 minutes as needed for actual or suspected opioid overdose. Call 911 if used.  omeprazole (PRILOSEC) 20 mg Oral Capsule,  Delayed Release(E.C.), Take 1 Capsule (20 mg total) by mouth Once a day  pravastatin (PRAVACHOL) 80 mg Oral Tablet,   rizatriptan (MAXALT-MLT) 5 mg Oral Tablet, Rapid Dissolve, Take 1 Tablet (5 mg total) by mouth Once, as needed May repeat in 2 hours if needed.  rosuvastatin (CRESTOR) 10 mg Oral Tablet, Take 1 Tablet (10 mg total) by mouth Every evening  traZODone (DESYREL) 50 mg Oral Tablet, Take 1 Tablet (50 mg total) by mouth Every night  venlafaxine (EFFEXOR XR) 75 mg Oral Capsule, Sust. Release 24 hr,   acetaminophen-codeine (TYLENOL #3) 300-30 mg Oral Tablet, TAKE ONE TABLET BY MOUTH EVERY 6 HOURS AS NEEDED  ezetimibe (ZETIA) 10 mg Oral Tablet, Take 1 Tablet (10 mg total) by mouth Every evening  nebivoloL (BYSTOLIC) 10 mg Oral Tablet, Take 1 Tablet (10 mg total) by mouth Once a day    No facility-administered medications prior to visit.    Allergies:  Allergies   Allergen Reactions    Acetic Acid-Aluminum Acetate Swelling    Sertraline  Other Adverse Reaction (Add comment)     Headache         Physical Exam:  Vitals:    10/14/22 1329   BP: (!) 158/88   Pulse: 55   SpO2: 98%   Weight: 72.6 kg (160 lb)   Height: 1.524 m (5')   BMI: 31.31      Physical Exam  Vitals and nursing note reviewed.   HENT:      Head: Atraumatic.      Right Ear: Tympanic membrane normal.      Left Ear: Tympanic membrane normal.      Mouth/Throat:      Mouth: Mucous membranes are moist.      Pharynx: Oropharynx is clear.      Comments: +chronic mild RT sided facial weakness due to previous bells however overall improved  Eyes:      Extraocular Movements: Extraocular movements intact.      Comments: +mild ptosis RT eye   Cardiovascular:      Rate and Rhythm: Normal rate and regular rhythm.      Pulses: Normal pulses.   Pulmonary:      Effort: Pulmonary effort is normal.  Breath sounds: Normal breath sounds.   Abdominal:      General: Bowel sounds are normal.      Palpations: Abdomen is soft.      Tenderness: There is no abdominal  tenderness. There is no right CVA tenderness or left CVA tenderness.   Musculoskeletal:         General: No swelling. Normal range of motion.      Cervical back: Normal range of motion and neck supple.      Comments: +DJD bilat hands/knees  Left knee > RT w crepitus noted and mild genu varum   +decreased muscle tone all extrem  Gait stable   Skin:     General: Skin is warm.      Findings: No lesion or rash.      Comments: +scattered purpura bilat upper extrem   Neurological:      General: No focal deficit present.      Mental Status: She is alert and oriented to person, place, and time.      Cranial Nerves: No cranial nerve deficit.      Sensory: No sensory deficit.      Motor: No weakness.      Gait: Gait normal.   Psychiatric:         Mood and Affect: Mood normal.         Behavior: Behavior normal.         Thought Content: Thought content normal.         Judgment: Judgment normal.       Urine Dip Results:   Time collected: 1358  Glucose (Ref Range: Negative mg/dL): Negative  Bilirubin (Ref Range: Negative mg/dL): Negative  Ketones (Ref Range: Negative mg/dL): Negative  Urine Specific Gravity (Ref Range: 1.005 - 1.030): 1.030  Blood (urine) (Ref Range: Negative mg/dL): Negative  pH (Ref Range: 5.0 - 8.0): 5.5  Protein (Ref Range: Negative mg/dL): Negative  Urobilinogen (Ref Range: Normal): 0.2mg /dL (Normal)  Nitrite (Ref Range: Negative): Negative  Leukocytes (Ref Range: Negative WBC's/uL): Negative          Assessment/Plan:  Problem List Items Addressed This Visit          Cardiovascular System    Essential hypertension       Musculoskeletal    Chronic arthralgias of knees and hips    Relevant Orders    Referral to External Provider (AMB)    Osteoarthritis    Relevant Orders    Referral to External Provider (AMB)       Psychiatric    Anxiety and depression       Other    Chronic fatigue     Other Visit Diagnoses       E-coli UTI    -  Primary    Relevant Orders    POCT URINE DIPSTICK    Recurrent UTI (urinary  tract infection)        Relevant Orders    URINALYSIS, MACROSCOPIC AND MICROSCOPIC W/CULTURE REFLEX (Completed)            Urine sent for culture  Cotinue hydration/proper hygiene  Referred to ortho concerning knee & hip pain  Pennsaid bid to areas prn  Continue tylenol #3 prn pain  B12 shot given  Meds refilled as noted   Continue follow-up with Dr. Hessie KnowsBlaydes  as scheduled  Continue follow-up with neurologist as scheduled  Continue diet exercise weight management       Post-Discharge Follow Up Appointments  Thursday Nov 18, 2022    Return Patient Visit with Eyvonne Mechanic, PA-C at  1:30 PM      Internal Medicine, Building A  [facility]  [address]  [place] [address]  [phone]            Seek medical attention for new or worsening symptoms.  Patient has been seen in this clinic within the last 3 years.     Earnestine Shipp, PA-C          This note was partially created using MModal Fluency Direct system (voice recognition software) and is inherently subject to errors including those of syntax and "sound-alike" substitutions which may escape proofreading.  In such instances, original meaning may be extrapolated by contextual derivation.

## 2022-10-15 ENCOUNTER — Telehealth (INDEPENDENT_AMBULATORY_CARE_PROVIDER_SITE_OTHER): Payer: Self-pay | Admitting: Physician Assistant

## 2022-10-19 ENCOUNTER — Encounter (INDEPENDENT_AMBULATORY_CARE_PROVIDER_SITE_OTHER): Payer: Self-pay | Admitting: Physician Assistant

## 2022-10-19 ENCOUNTER — Other Ambulatory Visit (INDEPENDENT_AMBULATORY_CARE_PROVIDER_SITE_OTHER): Payer: Self-pay

## 2022-10-19 DIAGNOSIS — N390 Urinary tract infection, site not specified: Secondary | ICD-10-CM

## 2022-10-20 ENCOUNTER — Encounter (INDEPENDENT_AMBULATORY_CARE_PROVIDER_SITE_OTHER): Payer: Self-pay | Admitting: Physician Assistant

## 2022-10-20 ENCOUNTER — Telehealth (INDEPENDENT_AMBULATORY_CARE_PROVIDER_SITE_OTHER): Payer: Self-pay | Admitting: Physician Assistant

## 2022-11-04 NOTE — ED Provider Notes (Signed)
 ED Provider Note    Subjective     HPI  Christine Mcmillan is a 77 year old female presented to ED via EMS with complaint of shortness of breath and cough that started approximately 11:00 last night.  EMS personnel stated that upon arrival the patient's room air oxygen saturation was 80% and had diminished bilateral breath sounds.  Decadron 10 mg DuoNeb was administered by EMS prior to arrival.  At presentation the patient states she feels much better and is breathing normally and denies any chest pain shortness of breath nausea vomiting fever chills or abdominal pain.  She states she has a history of hypertension.  This patient was seen and examined using current PPE guidelines set by [facility], which may include gown, face covering or shield, protective eyewear, and mask N95/or surgical masking, with appropriate handwashing, precautions as standard measures.  Objective     Vitals <redacted file path>:  ED Triage Vitals [11/04/22 0157]   BP Pulse Resp Temp SpO2 Flow (L/min) (Oxygen Therapy)   (!) 189/103 67 22 97.6 F (36.4 C) 97 % 2     Physical Exam        Nursing notes and vital signs reviewed.    Constitutional -NAD. Alert and Active.  HEENT - Normocephalic. Oropharynx with no erythema, lesions, or exudates. Moist mucous membranes. PERRLA, EOMI. Conjunctiva clear.  Bilateral TMs, external canal, and external ears appear normal.  Neck - Trachea midline. No stridor. No hoarseness.  Cardiac - Regular rate and rhythm. No murmurs, rubs, or gallops.  Respiratory - Clear to auscultation bilaterally. No rales, wheezes or rhonchi.  Abdomen - No abdominal tenderness to palpation, rebound or guarding noted.  Bowel sounds normal, non-distended.  Musculoskeletal - Good ROM throughout. No muscle or joint tenderness appreciated. No clubbing, cyanosis or edema.  Skin - Warm and dry, without any rashes or other lesions.  Neuro - Cranial nerves II-XII are grossly intact. Moving all extremities symmetrically. No focal  neurologic deficits noted.  Psychiatric: Normal affect, alert and oriented x 3.    Any pertinent labs and imaging obtained during this encounter reviewed below in MDM.                 ---MDM, Assessment, and Plan---      INITIAL PLAN:   Physical examination, indicated diagnostic studies and treatments ..and review old records with disposition and treatment to be determined by results.      DATA REVIEW/RADIOLOGIC EXAMINATIONS:  Results for orders placed or performed during the hospital encounter of 11/04/22   SARS COV 2, INFLU. A/B, RSV PCR (COFRP)    Specimen: NASOPHARYNX   Result Value Ref Range    Specimen Nasopharynx     SARS-CoV-2 Not Detected Not Detected    Interpretative Comment       This test has been authorized by FDA under an Emergency Use  Authorization (EUA) for use by authorized laboratories. Please review the "Fact  Sheets" for health care providers, and patients and the FDA authorized labeling  available on the [facility] website: www.[facility].com/Covid19.      Influenza A PCR Not Detected Not Detected    Influenza B PCR Not Detected Not Detected    Respiratory Syncytial Virus PCR Not Detected Not Detected    First test No     Employed In Healthcare No     Symptomatic as defined by CDC Not given     Date of symptom onset Not given     Hospitalized Not given  ICU Not given     Resident in a congregate care setting No     Is patient pregnant Not given     Race White or Caucasian     Ethnicity       Non  Hispanic  Lab studies performed by: [facility] located at [facility], [address]  [place], [place] [address]     XR CHEST 1 VW    Narrative    EXAM:  XR Chest, 1 View.     CLINICAL HISTORY:  (previous 01/04/22)  SOB    COMPARISON:  Pre-13 2023.    FINDINGS:    LUNGS AND PLEURA:  Infiltrative changes at the right base.  Left perihilar and right suprahilar subsegmental atelectasis.  No pleural effusion or pneumothorax.     HEART AND MEDIASTINUM:  The heart size and mediastinal contours are normal.      BONES:  No acute osseous abnormality.     IMPRESSION:  Infiltrative changes at the right base.  Left perihilar and right suprahilar subsegmental atelectasis.    Electronically signed by: Deborah Chalk, DO on 11/04/2022 03:51:45 AM US/Eastern   CBC WITH AUTO DIFF (CBCD)   Result Value Ref Range    WBC 20.6 (H) 4.0 - 10.5 K/uL    RBC 5.28 (H) 4.1 - 5.1 M/uL    Hemoglobin 15.3 12.0 - 16.0 g/dL    Hematocrit 60.4 36 - 46 %    MCV 86.9 78 - 98 fL    MCH 29.0 27.0 - 34.6 pg    MCHC 33.3 33.0 - 37.0 g/dL    RDW 54.0 98.1 - 19.1 %    Platelet Count 289 130 - 400 K/uL    MPV 10.9 9.4 - 12.4 fL    Seg 81.7 %    Lymph 12.7 %    Monos 5.2 %    Eos 0.2 %    Baso 0.2 %    Absolute Neut 16.8 (H) 1.8 - 7.7 K/uL    Absolute Lymph 2.6 1.0 - 5.0 K/uL    Absolute Mono 1.1 (H) 0 - 0.8 K/uL    Absolute Eos 0.1 0.0 - 0.4 K/uL    Absolute Basophils 0.0 0.0 - 0.2 K/uL     Comment: Lab studies performed by: [facility] located at [facility], [address]  [place], [place] [address]     COMPREHENSIVE METABOLIC PANEL(COMP)   Result Value Ref Range    Sodium 136 136 - 148 MMOL/L    Potassium 3.8 3.5 - 5.2 MMOL/L    Chloride 100 98 - 108 MMOL/L    CO2 23 20 - 32 MMOL/L    Urea Nitrogen 24 (H) 7 - 23 MG/DL    Creatinine 5.62 1.30 - 1.02 MG/DL     Comment: Creatinine results have been standardized to a reference material that is  traceable to IDMS method.      Glucose, Bld 142 (H) 74 - 106 mg/dL     Comment: Non fasting glucose range:  65-140 mg/dl  Impaired fasting Glucose:  100-125 mg/dL      Total Protein 6.7 5.7 - 8.2 G/DL    Albumin 3.6 3.2 - 5.0 G/DL    Calcium 9.3 8.5 - 86.5 MG/DL    Total Bilirubin 0.5 0.2 - 1.1 MG/DL    Alkaline Phosphatase, Serum 71 46 - 116 U/L    AST 38 (H) 15 - 37 U/L    ALT 31 14 - 59 U/L    Globulin  3.1 1.7 - 3.9 G/DL    Albumin/Globulin 1.2 0.7 - 2.3 RATIO    Anion Gap 13 (H) 3 - 12 mmol/L    Osmolality Calc 288 278 - 305 MOS/KG     Comment: Calculated Osmolality formula has been changed to the  Smithline-Gardner  Formula: [2(NA)+GLU/18+BUN/2.8]. Please note change in value from the previous  calculated results.      Bun/Creatinine 24.0 (H) 7 - 20    Glom Filt Rate, Estimated 57 (L) >60 ml/min/1.63m2     Comment: (Note)  The estimated GFR is a calculation valid for adults (18 to 78 years  old) that uses the CKD-EPI algorithm to adjust for age and sex. It is  not to be used for children, pregnant women, hospitalized patients,  patients on dialysis, or with rapidly changing kidney function.  According to the NKDEP, eGFR >89 is normal, 60-89 shows mild  impairment, 30-59 shows moderate impairment, 15-29 shows severe  impairment and <15 is ESRD.    Lab studies performed by: [facility] located at [facility], [address] [place], [place] [address]     MAGNESIUM(MG)   Result Value Ref Range    Magnesium 1.7 1.6 - 2.6 MG/DL     Comment: Lab studies performed by: [facility] located at [facility], [address]  [place], [place] [address]     PROTIME-INR(PT)   Result Value Ref Range    Pt(Patient) 10.1 9.0 - 11.5 SEC     Comment: Reference interval is for nonmedicated patients.    INR 1.0 Ratio     Comment: (Note)  Reference range                       0.9-1.1  Moderate-intensity warfarin therapy   2.0-3.0  High-intensity warfarin therapy       2.5-3.5  Lab studies performed by: [facility] located at [facility], [employer]. [place], [place] [address]     APTT(PTT)   Result Value Ref Range    PTT 21.0 (L) 22 - 34 SEC     Comment: Lab studies performed by: [facility] located at [facility], [employer] [place], [place] [address]     TROPONIN I HIGH SENSITIVITY (TROPHS)   Result Value Ref Range    Troponin HS 21 <37 ng/L     Comment: Lab studies performed by: [facility] located at [facility], [address]  [place], [place] [address]     PRO B TYPE NATRIURETIC PEPTIDE (PBNP)   Result Value Ref Range    Pro B Type Natriuretic Peptide 2442.0 (H) <450 pg/ml     Comment: Lab studies performed by: [facility] located  at [facility], [address]  [place], [place] [address]     LACTIC ACID (LACT)   Result Value Ref Range    Lactic Acid 2.1 0.4 - 2.2 MMOL/L     Comment: Lab studies performed by: [facility] located at [facility], [address]  [place], [place] [address]     D-DIMER,QUANT(DIMEQ)   Result Value Ref Range    D-Dimer, Quant 0.61 (H) <0.50 ug FEU/mL     Comment: (Note)  The D-Dimer test is used frequently to exclude an acute PE or DVT. In   patients with a low to moderate clinical risk assessment and a   D-Dimer result <0.5 ug FEU/mL, the likelihood of a PE or DVT is very   low. However, a thromboembolic event should not be excluded  solely on   the basis of the D-Dimer level. Increased levels of D-Dimer are   associated with a PE, DVT, DIC, malignancies, inflammation, sepsis,   surgery, trauma, pregnancy, and advancing patient age.    Lab studies performed by: [facility] located at [facility], [address] [place], [place] [address]             EKG PER PROVIDER:  2:06 AM: Normal sinus rhythm, rate 66 bpm, minimal change from study of 3/23    ED COURSE, DIFFERENTIAL DIAGNOSIS AND MEDICAL DECISION MAKING:  2:01 AM: Patient seen and examined, basic lab work EKG, chest x-ray ordered.  4:01 AM: Results of chest x-ray showed right lower lobe infiltrate.  D-dimer mildly elevated as above but age corrected likely normal.  Patient declined CTA chest.  Rocephin, doxycycline ordered.  I recommended admission for observation for IV antibiotics, pulmonary regimen, patient declined stating she had to many responsibilities at home and did not want to be admitted.  Will discharge patient on cefdinir, doxycycline, prednisone taper pack, Robitussin antitussive, and issue take-home albuterol inhaler. Red flags for return were discussed with the patient, and the patient verbalized agreement understanding with the plan.  Patient agreed to see their primary care provider in 1 to 2 days for recheck, the patient was stable for  discharge.              Assessment and Plan:     Clinical Impression <redacted file path>:  1. Leukocytosis, unspecified type    2. Pneumonia of right lower lobe due to infectious organism    3. Shortness of breath       Condition: Stable and Improved  Disposition <redacted file path>:  Discharge

## 2022-11-10 ENCOUNTER — Other Ambulatory Visit: Payer: Self-pay

## 2022-11-10 ENCOUNTER — Encounter (INDEPENDENT_AMBULATORY_CARE_PROVIDER_SITE_OTHER): Payer: Self-pay | Admitting: Physician Assistant

## 2022-11-10 ENCOUNTER — Ambulatory Visit: Payer: Medicare Other | Attending: Physician Assistant | Admitting: Physician Assistant

## 2022-11-10 DIAGNOSIS — M25551 Pain in right hip: Secondary | ICD-10-CM | POA: Insufficient documentation

## 2022-11-10 DIAGNOSIS — M1990 Unspecified osteoarthritis, unspecified site: Secondary | ICD-10-CM

## 2022-11-10 DIAGNOSIS — M25562 Pain in left knee: Secondary | ICD-10-CM | POA: Insufficient documentation

## 2022-11-10 DIAGNOSIS — D72829 Elevated white blood cell count, unspecified: Secondary | ICD-10-CM

## 2022-11-10 DIAGNOSIS — K219 Gastro-esophageal reflux disease without esophagitis: Secondary | ICD-10-CM | POA: Insufficient documentation

## 2022-11-10 DIAGNOSIS — F419 Anxiety disorder, unspecified: Secondary | ICD-10-CM | POA: Insufficient documentation

## 2022-11-10 DIAGNOSIS — R059 Cough, unspecified: Secondary | ICD-10-CM

## 2022-11-10 DIAGNOSIS — G8929 Other chronic pain: Secondary | ICD-10-CM

## 2022-11-10 DIAGNOSIS — J189 Pneumonia, unspecified organism: Secondary | ICD-10-CM

## 2022-11-10 DIAGNOSIS — G4700 Insomnia, unspecified: Secondary | ICD-10-CM | POA: Insufficient documentation

## 2022-11-10 DIAGNOSIS — I10 Essential (primary) hypertension: Secondary | ICD-10-CM | POA: Insufficient documentation

## 2022-11-10 DIAGNOSIS — F32A Depression, unspecified: Secondary | ICD-10-CM

## 2022-11-10 DIAGNOSIS — M25552 Pain in left hip: Secondary | ICD-10-CM | POA: Insufficient documentation

## 2022-11-10 DIAGNOSIS — M25561 Pain in right knee: Secondary | ICD-10-CM | POA: Insufficient documentation

## 2022-11-10 MED ORDER — PROMETHAZINE-DM 6.25 MG-15 MG/5 ML ORAL SYRUP
5.0000 mL | ORAL_SOLUTION | Freq: Four times a day (QID) | ORAL | 0 refills | Status: DC | PRN
Start: 2022-11-10 — End: 2022-12-08

## 2022-11-10 MED ORDER — LORAZEPAM 2 MG TABLET
2.0000 mg | ORAL_TABLET | Freq: Every evening | ORAL | 2 refills | Status: DC | PRN
Start: 2022-11-10 — End: 2022-12-08

## 2022-11-10 MED ORDER — ACETAMINOPHEN 300 MG-CODEINE 30 MG TABLET
1.0000 | ORAL_TABLET | Freq: Four times a day (QID) | ORAL | 0 refills | Status: DC | PRN
Start: 2022-11-10 — End: 2022-12-08

## 2022-11-10 MED ORDER — AMLODIPINE 5 MG TABLET
5.0000 mg | ORAL_TABLET | Freq: Every day | ORAL | 1 refills | Status: DC
Start: 2022-11-10 — End: 2023-02-02

## 2022-11-10 MED ORDER — OMEPRAZOLE 20 MG CAPSULE,DELAYED RELEASE
20.0000 mg | DELAYED_RELEASE_CAPSULE | Freq: Every day | ORAL | 1 refills | Status: DC
Start: 2022-11-10 — End: 2023-02-02

## 2022-11-10 NOTE — Progress Notes (Signed)
INTERNAL MEDICINE, [facility] [place]  [place] [id number]        Telephone Visit    Name:  Christine Mcmillan MRN: [id number]   Date:  11/10/2022 Age:   77 y.o.     The patient/family initiated a request for telephone service.  Verbal consent for this service was obtained from the patient/family.    TELEMEDICINE DOCUMENTATION:    [facility]    Patient/family aware of provider location:  yes  Patient/family consent for telemedicine:  yes  Examination observed and performed by:  Cleophus Molt, PA-C       Chief Complaint   Patient presents with   . Emergency Room Follow Up   . Cough   . Medication Refill        Call notes:     Nelda Severe, MD - 11/04/2022 2:03 AM EST  Formatting of this note is different from the original.  Images from the original note were not included.    ED Provider Note    Subjective    HPI  Christine Mcmillan is a 77 year old female presented to ED via EMS with complaint of shortness of breath and cough that started approximately 11:00 last night. EMS personnel stated that upon arrival the patient's room air oxygen saturation was 80% and had diminished bilateral breath sounds. Decadron 10 mg DuoNeb was administered by EMS prior to arrival. At presentation the patient states she feels much better and is breathing normally and denies any chest pain shortness of breath nausea vomiting fever chills or abdominal pain. She states she has a history of hypertension.  This patient was seen and examined using current PPE guidelines set by [facility], which may include gown, face covering or shield, protective eyewear, and mask N95/or surgical masking, with appropriate handwashing, precautions as standard measures.  Objective    Vitals:  ED Triage Vitals [11/04/22 0157]  BP Pulse Resp Temp SpO2 Flow (L/min) (Oxygen Therapy)  (!) 189/103 67 22 97.6 F (36.4 C) 97 % 2    Physical Exam    Nursing notes and vital signs reviewed.    Constitutional -NAD. Alert and  Active.  HEENT - Normocephalic. Oropharynx with no erythema, lesions, or exudates. Moist mucous membranes. PERRLA, EOMI. Conjunctiva clear. Bilateral TMs, external canal, and external ears appear normal.  Neck - Trachea midline. No stridor. No hoarseness.  Cardiac - Regular rate and rhythm. No murmurs, rubs, or gallops.  Respiratory - Clear to auscultation bilaterally. No rales, wheezes or rhonchi.  Abdomen - No abdominal tenderness to palpation, rebound or guarding noted. Bowel sounds normal, non-distended.  Musculoskeletal - Good ROM throughout. No muscle or joint tenderness appreciated. No clubbing, cyanosis or edema.  Skin - Warm and dry, without any rashes or other lesions.  Neuro - Cranial nerves II-XII are grossly intact. Moving all extremities symmetrically. No focal neurologic deficits noted.  Psychiatric: Normal affect, alert and oriented x 3.  Any pertinent labs and imaging obtained during this encounter reviewed below in MDM.            ---MDM, Assessment, and Plan---    INITIAL PLAN:  Physical examination, indicated diagnostic studies and treatments ..and review old records with disposition and treatment to be determined by results.    DATA REVIEW/RADIOLOGIC EXAMINATIONS:  Results for orders placed or performed during the hospital encounter of 11/04/22  SARS COV 2, INFLU. A/B, RSV PCR (COFRP)  Specimen: NASOPHARYNX  Result Value Ref Range  Specimen  Nasopharynx  SARS-CoV-2 Not Detected Not Detected  Interpretative Comment  This test has been authorized by FDA under an Emergency Use  Authorization (EUA) for use by authorized laboratories. Please review the "Fact  Sheets" for health care providers, and patients and the FDA authorized labeling  available on the [facility] website: www.[facility].com/[email or web address].    Influenza A PCR Not Detected Not Detected  Influenza B PCR Not Detected Not Detected  Respiratory Syncytial Virus PCR Not Detected Not Detected  First test No  Employed In Healthcare No  Symptomatic  as defined by CDC Not given  Date of symptom onset Not given  Hospitalized Not given  ICU Not given  Resident in a congregate care setting No  Is patient pregnant Not given  Race White or Caucasian  Ethnicity  Non  Hispanic  Lab studies performed by: [facility] located at [facility], [address], [place] [address]    XR CHEST 1 VW  Narrative  EXAM:  XR Chest, 1 View.    CLINICAL HISTORY:  (previous 01/04/22) SOB    COMPARISON:  Pre-13 2023.    FINDINGS:    LUNGS AND PLEURA:  Infiltrative changes at the right base. Left perihilar and right suprahilar subsegmental atelectasis.  No pleural effusion or pneumothorax.    HEART AND MEDIASTINUM:  The heart size and mediastinal contours are normal.    BONES:  No acute osseous abnormality.    IMPRESSION:  Infiltrative changes at the right base. Left perihilar and right suprahilar subsegmental atelectasis.    Electronically signed by: Trinna Post, DO on 11/04/2022 03:51:45 AM US/Eastern  CBC WITH AUTO DIFF (CBCD)  Result Value Ref Range  WBC 20.6 (H) 4.0 - 10.5 K/uL  RBC 5.28 (H) 4.1 - 5.1 M/uL  Hemoglobin 15.3 12.0 - 16.0 g/dL  Hematocrit 45.9 36 - 46 %  MCV 86.9 78 - 98 fL  MCH 29.0 27.0 - 34.6 pg  MCHC 33.3 33.0 - 37.0 g/dL  RDW 13.7 11.5 - 14.5 %  Platelet Count 289 130 - 400 K/uL  MPV 10.9 9.4 - 12.4 fL  Seg 81.7 %  Lymph 12.7 %  Monos 5.2 %  Eos 0.2 %  Baso 0.2 %  Absolute Neut 16.8 (H) 1.8 - 7.7 K/uL  Absolute Lymph 2.6 1.0 - 5.0 K/uL  Absolute Mono 1.1 (H) 0 - 0.8 K/uL  Absolute Eos 0.1 0.0 - 0.4 K/uL  Absolute Basophils 0.0 0.0 - 0.2 K/uL  Comment: Lab studies performed by: [facility] located at [facility], [address], [place] [address]    COMPREHENSIVE METABOLIC PANEL(COMP)  Result Value Ref Range  Sodium 136 136 - 148 MMOL/L  Potassium 3.8 3.5 - 5.2 MMOL/L  Chloride 100 98 - 108 MMOL/L  CO2 23 20 - 32 MMOL/L  Urea Nitrogen 24 (H) 7 - 23 MG/DL  Creatinine 1.00 0.55 - 1.02 MG/DL  Comment: Creatinine results have been standardized to a reference  material that is  traceable to IDMS method.    Glucose, Bld 142 (H) 74 - 106 mg/dL  Comment: Non fasting glucose range: 65-140 mg/dl  Impaired fasting Glucose: 100-125 mg/dL    Total Protein 6.7 5.7 - 8.2 G/DL  Albumin 3.6 3.2 - 5.0 G/DL  Calcium 9.3 8.5 - 10.4 MG/DL  Total Bilirubin 0.5 0.2 - 1.1 MG/DL  Alkaline Phosphatase, Serum 71 46 - 116 U/L  AST 38 (H) 15 - 37 U/L  ALT 31 14 - 59 U/L  Globulin 3.1 1.7 -  3.9 G/DL  Albumin/Globulin 1.2 0.7 - 2.3 RATIO  Anion Gap 13 (H) 3 - 12 mmol/L  Osmolality Calc 288 278 - 305 MOS/KG  Comment: Calculated Osmolality formula has been changed to the Smithline-Gardner  Formula: [2(NA)+GLU/18+BUN/2.8]. Please note change in value from the previous  calculated results.    Bun/Creatinine 24.0 (H) 7 - 20  Glom Filt Rate, Estimated 57 (L) >60 ml/min/1.21m2  Comment: (Note)  The estimated GFR is a calculation valid for adults (18 to 78 years  old) that uses the CKD-EPI algorithm to adjust for age and sex. It is  not to be used for children, pregnant women, hospitalized patients,  patients on dialysis, or with rapidly changing kidney function.  According to the NKDEP, eGFR >89 is normal, 60-89 shows mild  impairment, 30-59 shows moderate impairment, 15-29 shows severe  impairment and <15 is ESRD.    Lab studies performed by: [facility] located at [facility], [place], [place] [address]    MAGNESIUM(MG)  Result Value Ref Range  Magnesium 1.7 1.6 - 2.6 MG/DL  Comment: Lab studies performed by: [facility] located at [facility], [place], [place] [address]    PROTIME-INR(PT)  Result Value Ref Range  Pt(Patient) 10.1 9.0 - 11.5 SEC  Comment: Reference interval is for nonmedicated patients.  INR 1.0 Ratio  Comment: (Note)  Reference range 0.9-1.1  Moderate-intensity warfarin therapy 2.0-3.0  High-intensity warfarin therapy 2.5-3.5  Lab studies performed by: [facility] located at [facility], [place] [address]    APTT(PTT)  Result Value Ref  Range  PTT 21.0 (L) 22 - 34 SEC  Comment: Lab studies performed by: [facility] located at [facility], [place] [address]    TROPONIN I HIGH SENSITIVITY (TROPHS)  Result Value Ref Range  Troponin HS 21 <37 ng/L  Comment: Lab studies performed by: [facility] located at [facility], [address], [place] [address]    PRO B TYPE NATRIURETIC PEPTIDE (PBNP)  Result Value Ref Range  Pro B Type Natriuretic Peptide 2442.0 (H) <450 pg/ml  Comment: Lab studies performed by: [facility] located at [facility], [place] [address]    LACTIC ACID (LACT)  Result Value Ref Range  Lactic Acid 2.1 0.4 - 2.2 MMOL/L  Comment: Lab studies performed by: [facility] located at [facility], [address], [place] [address]    D-DIMER,QUANT(DIMEQ)  Result Value Ref Range  D-Dimer, Quant 0.61 (H) <0.50 ug FEU/mL  Comment: (Note)  The D-Dimer test is used frequently to exclude an acute PE or DVT. In  patients with a low to moderate clinical risk assessment and a  D-Dimer result <0.5 ug FEU/mL, the likelihood of a PE or DVT is very  low. However, a thromboembolic event should not be excluded solely on  the basis of the D-Dimer level. Increased levels of D-Dimer are  associated with a PE, DVT, DIC, malignancies, inflammation, sepsis,  surgery, trauma, pregnancy, and advancing patient age.    Lab studies performed by: [facility] located at [facility], [place], [place] [address]      EKG PER PROVIDER:  2:06 AM: Normal sinus rhythm, rate 66 bpm, minimal change from study of 3/23    ED COURSE, DIFFERENTIAL DIAGNOSIS AND MEDICAL DECISION MAKING:  2:01 AM: Patient seen and examined, basic lab work EKG, chest x-ray ordered.  4:01 AM: Results of chest x-ray  showed right lower lobe infiltrate. D-dimer mildly elevated as above but age corrected likely normal. Patient declined CTA chest. Rocephin, doxycycline ordered. I recommended admission for observation for IV antibiotics, pulmonary  regimen, patient declined stating she had to many responsibilities at home and did not want to be admitted. Will discharge patient on cefdinir, doxycycline, prednisone taper pack, Robitussin antitussive, and issue take-home albuterol inhaler. Red flags for return were discussed with the patient, and the patient verbalized agreement understanding with the plan.  Patient agreed to see their primary care provider in 1 to 2 days for recheck, the patient was stable for discharge.    Assessment and Plan:    Clinical Impression:  1. Leukocytosis, unspecified type  2. Pneumonia of right lower lobe due to infectious organism  3. Shortness of breath    Condition: Stable and Improved  Disposition:  Discharge      Electronically signed by Nelda Severe, MD at 11/04/2022 4:04 AM EST        Allergies   Allergen Reactions   . Acetic Acid-Aluminum Acetate Swelling   . Sertraline  Other Adverse Reaction (Add comment)     Headache          Past Medical History:   Diagnosis Date   . Abnormal renal function    . Anxiety    . Arthralgia    . Bell's palsy    . Chronic hypokalemia    . COVID-19 vaccine series completed    . Depression    . Esophageal reflux    . History of memory loss    . History of recurrent UTIs    . Hypertension         Social History     Socioeconomic History   . Marital status: Divorced   Tobacco Use   . Smoking status: Never   . Smokeless tobacco: Never   Substance and Sexual Activity   . Alcohol use: Never   . Drug use: Never     Social Determinants of Health     Financial Resource Strain: Low Risk  (07/22/2022)    Financial Resource Strain    . SDOH Financial: No   Transportation Needs: Low Risk  (07/22/2022)    Transportation Needs    . SDOH Transportation: No   Social Connections: Low Risk  (07/22/2022)    Social Connections    . SDOH Social Isolation: 5 or more times a week   Intimate Partner Violence: Low Risk  (07/22/2022)    Intimate Partner Violence    . SDOH Domestic Violence: No   Housing Stability: Low  Risk  (07/22/2022)    Housing Stability    . SDOH Housing Situation: I have housing.    Marland Kitchen SDOH Housing Worry: No        Past Surgical History:   Procedure Laterality Date   . BRAIN TUMOR EXCISION     . HX LIPOMA RESECTION N/A     Lipoma on back        Family Medical History:       Problem Relation (Age of Onset)    Cancer Mother               Physical Exam:    Physical Exam      ICD-10-CM    1. RLL pneumonia  J18.9       2. Cough, unspecified type  R05.9       3. Leukocytosis, unspecified type  D72.829  4. Essential hypertension  I10       5. Chronic GERD  K21.9       6. Insomnia, unspecified type  G47.00       7. Anxiety and depression  F41.9     F32.A           Plan      Total provider time spent with the patient on the phone: *** minutes.    This note was partially created using MModal Fluency Direct system (voice recognition software) and is inherently subject to errors including those of syntax and "sound-alike" substitutions which may escape proofreading.  In such instances, original meaning may be extrapolated by contextual derivation.    Summerlyn Fickel, PA-C

## 2022-11-11 ENCOUNTER — Ambulatory Visit (INDEPENDENT_AMBULATORY_CARE_PROVIDER_SITE_OTHER): Payer: Self-pay | Admitting: Physician Assistant

## 2022-11-15 ENCOUNTER — Ambulatory Visit (INDEPENDENT_AMBULATORY_CARE_PROVIDER_SITE_OTHER): Payer: Medicare Other | Admitting: Physician Assistant

## 2022-11-17 ENCOUNTER — Other Ambulatory Visit: Payer: Self-pay

## 2022-11-17 ENCOUNTER — Ambulatory Visit: Payer: Medicare Other | Attending: Physician Assistant | Admitting: Physician Assistant

## 2022-11-17 ENCOUNTER — Other Ambulatory Visit (INDEPENDENT_AMBULATORY_CARE_PROVIDER_SITE_OTHER): Payer: Self-pay | Admitting: Physician Assistant

## 2022-11-17 ENCOUNTER — Encounter (INDEPENDENT_AMBULATORY_CARE_PROVIDER_SITE_OTHER): Payer: Self-pay | Admitting: Physician Assistant

## 2022-11-17 VITALS — BP 140/90 | HR 73 | Temp 97.6°F | Ht 60.0 in | Wt 160.0 lb

## 2022-11-17 DIAGNOSIS — Z0000 Encounter for general adult medical examination without abnormal findings: Secondary | ICD-10-CM | POA: Insufficient documentation

## 2022-11-17 DIAGNOSIS — Z780 Asymptomatic menopausal state: Secondary | ICD-10-CM | POA: Insufficient documentation

## 2022-11-17 DIAGNOSIS — J189 Pneumonia, unspecified organism: Secondary | ICD-10-CM

## 2022-11-17 DIAGNOSIS — Z1211 Encounter for screening for malignant neoplasm of colon: Secondary | ICD-10-CM | POA: Insufficient documentation

## 2022-11-17 DIAGNOSIS — Z1231 Encounter for screening mammogram for malignant neoplasm of breast: Secondary | ICD-10-CM | POA: Insufficient documentation

## 2022-11-17 NOTE — Nursing Note (Signed)
Patient is completing her Welcome to Medicare Exam today.

## 2022-11-17 NOTE — Patient Instructions (Signed)
Medicare Preventive Services  Medicare coverage information Recommendation for YOU   Heart Disease and Diabetes   Lipid profile Every 5 years or more often if at risk for cardiovascular disease     Lab Results   Component Value Date    CHOLESTEROL 245 (H) 08/25/2022    HDLCHOL 48 08/25/2022    LDLCHOL 155 (H) 08/25/2022    TRIG 210 (H) 08/25/2022           Diabetes Screening    Yearly for those at risk for diabetes, 2 tests per year for those with prediabetes Last Glucose: 140    Diabetes Self Management Training or Medical Nutrition Therapy  For those with diabetes, up to 10 hrs initial training within a year, subsequent years up to 2 hrs of follow up training Optional for those with diabetes     Medical Nutrition Therapy  Three hours of one-on-one counseling in first year, two hours in subsequent years Optional for those with diabetes, kidney disease   Intensive Behavioral Therapy for Obesity  Face-to-face counseling, first month every week, month 2-6 every other week, month 7-12 every month if continued progress is documented Optional for those with Body Mass Index 30 or higher  Your Body mass index is 31.25 kg/m.   Tobacco Cessation (Quitting) Counseling   Covers up to 8 smoking and tobacco-use cessation counseling sessions in a 75-month period.    Optional for those that use tobacco   Cancer Screening Last Completion Date   Colorectal screening   For anyone age 40 to 6 or any age if high risk:  Screening Colonoscopy every 10 yrs if low risk,  more frequent if higher risk  OR  Cologuard Stool DNA test once every 3 years OR  Fecal Occult Blood Testing yearly OR  Flexible  Sigmoidoscopy  every 5 yr OR  CT Colonography every 5 yrs    ?  See below for due date if applicable.   Screening Pap Test   Recommended every 3 years for all women age 7 to 51, or every five years if combined with HPV test (routine screening not needed after total hysterectomy).  Medicare covers every 2 years or yearly if high  risk.  Screening Pelvic Exam   Medicare covers every 2 years, yearly if high risk or childbearing age with abnormal Pap in last 3 yrs.   ?  See below for due date if applicable.   Screening Mammogram   Recommended every 2 years for women age 78 to 78, or more frequent if you have a higher risk. Selectively recommended for women between 40-49 based on shared decisions about risk. Covered by Medicare up to every year for women age 40 or older ?  See below for due date if applicable.         Lung Cancer Screening  Annual low dose computed tomography (LDCT scan) is recommended for those age 78-78 who smoked 20 pack-years and are current smokers or quit smoking within past 15 years, after counseling by your doctor or nurse clinician about the possible benefits or harms.   ?  See below for due date if applicable.   Vaccinations   Respiratory syncytial virus (RSV)  Age 78 years or older: Based on shared clinical decision-making with your provider.  Pneumococcal Vaccine  Recommended routinely age 78+ with one or two separate vaccines based on your risk. Recommended before age 8 if medical conditions with increased risk  Seasonal Influenza Vaccine  Once every flu season  Hepatitis B Vaccine  3 doses if risk (including anyone with diabetes or liver disease)  Shingles Vaccine  Two doses at age 78 or older  Diphtheria Tetanus Pertussis Vaccine  ONCE as adult, booster every 10 years     Immunization History   Administered Date(s) Administered   . Covid-19 Vaccine,Moderna,12 Years+ 08/11/2020, 09/08/2020   . Tetanus Toxoid/Diphtheria Toxoid/Acellular Pertussis Vaccine, Adsorbed (Tdap) 01/09/2021     Shingles vaccine and Diphtheria Tetanus Pertussis vaccines are available at pharmacies or local health department without a prescription.   Other Preventative Screening  Last Completion Date   Bone Densitometry   Every 24 months for anyone at risk, including postmenopausal     ?  See below for due date if applicable.     Glaucoma  Screening   Yearly if in high risk group such as diabetes, family history, African American age 38+ or Hispanic American age 45+   See your eye care provider for screening.   Hepatitis C Screening   Recommended  for those born between ages 18-79 years.   ?  See below for due date if applicable.     HIV Testing  Recommended routinely at least ONCE, covered every year for age 78 to 78 regardless of risk, and every year for age over 45 who ask for the test or higher risk. Yearly or up to 3 times in pregnancy       ?  See below for due date if applicable.   Abdominal Aortic Aneurysm Screening Ultrasound   Once with a family history of abdominal aortic aneurysms OR a female between65-75 and have smoked at least 100 cigarettes in your lifetime.       ?  See below for due date if applicable.       Your Personalized Schedule for Preventive Tests   Health Maintenance: Pending and Last Completed       Date Due Completion Date    Osteoporosis screening Never done ---    Hepatitis C screening Never done ---    Medicare Annual Wellness Visit 11/18/2023 11/17/2022    Adult Tdap-Td (2 - Td or Tdap) 01/10/2031 01/09/2021             Non-Opioid Treatment for Chronic Pains   Treatment for chronic pain can be managed without opioids. Below are non-opioid options that may be considered and discussed with your provider to determine which options would be best for your health.    Over the counter or presciptions medications:  Acetaminophen (Tylenol) or Non-steroidal anti-inflammatories such as: Ibuprofen (Motrin, Advil), naproxen (Aleve), aspirin  Antidepressants such as amitriptyline, nortriptyline (Pamelor),  Doxepin (Silenor), Imipramine (Tofranil) and others.  Anticonvulsant Nerve pain medications: Gabapentin (Neurontin), pregabalin (Lyrica)  Externally applied medications such as NSAID'S, lidocaine, capsaisin, and others  Injections: pain specialists can sometimes inject medications at the site of pain.    Alternative therapies such  as  Acupuncture  Osteopathic manipulation  Chiropractic  Massage therapy       For Information on Advanced Directives for Health Care:  [place]:  [email or web address]  [place], [place], [place], [place] General Information: [email or web address]

## 2022-11-17 NOTE — Progress Notes (Signed)
INTERNAL MEDICINE, BUILDING A  [address]  [place] [address]  Operated by [facility]  Medicare Annual Wellness Visit    Name: Christine Mcmillan MRN:  [id number]   Date: 11/17/2022 Age: 77 y.o.       SUBJECTIVE:   Christine Mcmillan is a 77 y.o. female for presenting for welcome to [facility] Wellness exam.   I have reviewed and reconciled the medication list with the patient today.        11/17/2022     2:01 PM   Comprehensive Health Assessment-Adult   Do you wish to complete this form? Yes   During the past 4 weeks, how would you rate your health in general? Fair   During the past 4 weeks, how much difficulty have you had doing your usual activities inside and outside your home because of medical or emotional problems? Much difficulty   During the past 4 weeks, was someone available to help you if you needed and wanted help? Yes, some   In the past year, how many times have you gone to the emergency department or been admitted to a hospital for a health problem? 5 times or more   Are you generally satisfied with your sleep? No   Do you have enough money to buy things you need in everyday life, such as food, clothing, medicines, and housing? Yes, always   Can you get to places beyond walking distance without help?  (For example, can you drive your own car or travel alone on buses)? Yes   Do you fasten your seatbelt when you are in a car? Yes, usually   Do you exercise 20 minutes 3 or more days per week (such as walking, dancing, biking, mowing grass, swimming)? No, I usually don't exercise this much   How often do you eat food that is healthy (fruits, vegetables, lean meats) instead of unhealthy (sweets, fast food, junk food, fatty foods)? Most of the time   How often do you have trouble taking medicines the eay you are told to take them? I always take them as prescribed   Do you need any help communicating with your doctors and nurses because of vision or hearing problems? No    During the past 12 months, have you experienced confusion or memory loss that is happening more often or is getting worse? Possibly, I am not sure   Do you have one person you think of as your personal doctor (primary care provider or family doctor)? Yes   If you are seeing a Primary Care Provider (PCP) or family doctor. please list their name Amy Alvis   Are you now also seeing any specialist physician(s) (such as eye doctor, foot doctor, skin doctor)? Yes   If you are seeing a specialist for anything such as foot, eye, skin, etc.  please list their name(s) Dr. Baruch Goldmann   How confident are you that you can control or manage most of your health problems? Very confident         I have reviewed and updated as appropriate the past medical, family and social history. 11/17/2022 as summarized below:  Past Medical History:   Diagnosis Date    Abnormal renal function     Anxiety     Arthralgia     Bell's palsy     Chronic hypokalemia     COVID-19 vaccine series completed     Depression     Esophageal reflux     History of  memory loss     History of recurrent UTIs     Hypertension      Past Surgical History:   Procedure Laterality Date    Brain tumor excision      Hx lipoma resection N/A      Current Outpatient Medications   Medication Sig    acetaminophen-codeine (TYLENOL #3) 300-30 mg Oral Tablet Take 1 Tablet by mouth Every 6 hours as needed    amLODIPine (NORVASC) 5 mg Oral Tablet Take 1 Tablet (5 mg total) by mouth Once a Bertice Risse    diclofenac sodium (PENNSAID) 20 mg/gram /actuation(2 %) solution in metered-dose pump Apply 1 Application  topically Twice daily    doxycycline hyclate (VIBRAMYCIN) 100 mg Oral Capsule Take 1 Capsule (100 mg total) by mouth Twice daily    ergocalciferol, vitamin D2, (DRISDOL) 1,250 mcg (50,000 unit) Oral Capsule Take 1 Capsule (50,000 Units total) by mouth Every 7 days    ezetimibe (ZETIA) 10 mg Oral Tablet Take 1 Tablet (10 mg total) by mouth Every evening    ezetimibe (ZETIA) 10 mg Oral  Tablet Take 1 Tablet (10 mg total) by mouth Every evening    furosemide (LASIX) 40 mg Oral Tablet Take 1 Tablet (40 mg total) by mouth Once per Flint Hakeem as needed for Other (Edema)    LORazepam (ATIVAN) 2 mg Oral Tablet Take 1 Tablet (2 mg total) by mouth Every night as needed for Anxiety    NALOXONE 4 MG/ACTUATION NASAL SPRAY 1 Spray by INTRANASAL route Every 2 minutes as needed for actual or suspected opioid overdose. Call 911 if used.    nebivoloL (BYSTOLIC) 10 mg Oral Tablet Take 1 Tablet (10 mg total) by mouth Once a Brier Firebaugh    omeprazole (PRILOSEC) 20 mg Oral Capsule, Delayed Release(E.C.) Take 1 Capsule (20 mg total) by mouth Once a Yoon Barca    pravastatin (PRAVACHOL) 80 mg Oral Tablet     promethazine-dextromethorphan (PHENERGAN-DM) 6.25-15 mg/5 mL Oral Syrup Take 5 mL by mouth Four times a Chaka Jefferys as needed for Cough for up to 7 days    rizatriptan (MAXALT-MLT) 5 mg Oral Tablet, Rapid Dissolve Take 1 Tablet (5 mg total) by mouth Once, as needed May repeat in 2 hours if needed.    rosuvastatin (CRESTOR) 10 mg Oral Tablet Take 1 Tablet (10 mg total) by mouth Every evening    traZODone (DESYREL) 50 mg Oral Tablet Take 1 Tablet (50 mg total) by mouth Every night    venlafaxine (EFFEXOR XR) 75 mg Oral Capsule, Sust. Release 24 hr      Family Medical History:       Problem Relation (Age of Onset)    Cancer Mother            Social History     Socioeconomic History    Marital status: Divorced   Tobacco Use    Smoking status: Never    Smokeless tobacco: Never   Substance and Sexual Activity    Alcohol use: Never    Drug use: Never     Social Determinants of Health     Financial Resource Strain: Low Risk  (07/22/2022)    Financial Resource Strain     SDOH Financial: No   Transportation Needs: Low Risk  (07/22/2022)    Transportation Needs     SDOH Transportation: No   Social Connections: Low Risk  (07/22/2022)    Social Connections     SDOH Social Isolation: 5 or more times a week   Intimate  Partner Violence: Low Risk  (07/22/2022)     Intimate Partner Violence     SDOH Domestic Violence: No   Housing Stability: Low Risk  (07/22/2022)    Housing Stability     SDOH Housing Situation: I have housing.     SDOH Housing Worry: No   Health Literacy: Low Risk  (06/16/2022)    Health Literacy     SDOH Health Literacy: Never   Employment Status: Low Risk  (07/22/2022)    Employment Status     SDOH Employment: Full-time work         Lexicographer   Care Team       PCP       Name Type Specialty Phone Number    [place], [place], [place] Physician [place] [phone]              Care Team       No care team found                      Health Maintenance   Topic Date Due    Osteoporosis screening  Never done    Hepatitis C screening  Never done    Medicare Annual Wellness Visit  11/18/2023    Adult Tdap-Td (2 - Td or Tdap) 01/10/2031    Meningococcal Vaccine  Aged Out    Influenza Vaccine  Discontinued    Shingles Vaccine  Discontinued    Covid-19 Vaccine  Discontinued    Pneumococcal Vaccination, Age 6+  Discontinued     Medicare Wellness Assessment   Medicare initial or wellness physical in the last year?: Yes  Advance Directives   Does patient have a living will or MPOA: No   Has patient provided [employer] with a copy?: No   Advance directive information given to the patient today?: Patient Declined      Activities of Daily Living   Do you need help with dressing, bathing, or walking?: No   Do you need help with shopping, housekeeping, medications, or finances?: No   Do you have rugs in hallways, broken steps, or poor lighting?: No   Do you have grab bars in your bathroom, non-slip strips in your tub, and hand rails on your stairs?: Yes   Urinary Incontinence Screen   Do you ever leak urine when you don't want to?: No   Cognitive Function Screen (1=Yes, 0=No)   What is you age?: Correct   What is the time to the nearest hour?: Incorrect   What is the year?: Correct   What is the name of this clinic?: Correct   Can the  patient recognize two persons (the doctor, the nurse, home help, etc.)?: Correct   What is the date of your birth? (Norris Brumbach and month sufficient) : Correct   In what year did World War II end?: Incorrect   Who is the current president of the [place]?: Correct   Count from 20 down to 1?: Correct   What address did I give you earlier?: Incorrect   Total Score: 7   Interpretation of Total Score: Greater than 6 Normal   Hearing Screen   Have you noticed any hearing difficulties?: Yes  After whispering 9-1-6 how many numbers did the patient repeat correctly?: 1  After whispering 4-7-8 how many numbers did the patient repeat correctly?: 1   Fall Risk Screen   Do you feel unsteady when standing or walking?: No  Do you worry about falling?: No  Have you fallen in the past year?: No   Vision Screen           Depression Screen     Little interest or pleasure in doing things.: Not at all  Feeling down, depressed, or hopeless: Not at all  PHQ 2 Total: 0     Pain Score   Pain Score:   9    Substance Use-Abuse Screening     Tobacco Use     In Past 12 MONTHS, how often have you used any tobacco product (for example, cigarettes, e-cigarettes, cigars, pipes, or smokeless tobacco)?: Never     Alcohol use     In the PAST 12 MONTHS, how often have you had 5 (men)/4 (women) or more drinks containing alcohol in one AmeLie Hollars?: Never     Prescription Drug Use     In the PAST 12 months, how often have you used any prescription medications just for the feeling, more than prescribed, or that were not prescribed for you? Prescriptions may include: opioids, benzodiazepines, medications for ADHD: Never           Illicit Drug Use   In the PAST 12 MONTHS, how often have you used any drugs, including marijuana, cocaine or crack, heroin, methamphetamine, hallucinogens, ecstasy/MDMA?: Never         OBJECTIVE:   BP (!) 140/90 (Site: Left, Patient Position: Sitting, Cuff Size: Adult)   Pulse 73   Temp 36.4 C (97.6 F) (Tympanic)   Ht 1.524 m (5')    Wt 72.6 kg (160 lb)   SpO2 97%   BMI 31.25 kg/m        Other appropriate exam:    Pt refuses EKG today    Health Maintenance Due   Topic Date Due    Osteoporosis screening  Never done    Hepatitis C screening  Never done      ASSESSMENT & PLAN:   Assessment/Plan   1. Welcome to Medicare preventive visit    2. Screening mammogram for breast cancer    3. Post-menopausal    4. Encounter for screening colonoscopy for non-high-risk patient       Identified Risk Factors/ Recommended Actions       The PHQ 2 Total: 0 depression screen is interpreted as negative.      Opioid use plan of care:         Opioids use: Plan: Assessment of pain completed and pain controlled    Patient declined Advanced Directives information.        Orders Placed This Encounter    MAMMO BILATERAL SCREENING-ADDL VIEWS/BREAST US AS REQ BY RAD    BONE DENSITOMETRY COMPARISON    Referral to External Provider (AMB)     Refused EKG today     The patient has been educated about risk factors and recommended preventive care. Written Prevention Plan completed/ updated and given to patient (see After Visit Summary).    Total time on visit 35 mins    Post-Discharge Follow Up Appointments       Wednesday Dec 08, 2022    Return Patient Visit with Cleophus Molt, PA-C at  2:30 PM      Wednesday Jan 05, 2023    Return Patient Visit with Cleophus Molt, PA-C at  2:30 PM      Monday Nov 21, 2023    Return Telephone Visit with Lavone Orn, Amy, PA-C at  1:00 PM  Internal Medicine, Building [facility]  [address] [place]  [place] [address]  [phone]             Medicare Preventive Services  Medicare coverage information Recommendation for YOU   Heart Disease and Diabetes   Lipid profile Every 5 years or more often if at risk for cardiovascular disease  Last Lipid Panel  (Last result in the past 2 years)        Cholesterol   HDL   LDL   Direct LDL   Triglycerides      08/25/22 1459 245   48   155     210             Diabetes Screening  yearly for those  at risk for diabetes, 2 tests per year for those with prediabetes Last Glucose: 140    Diabetes Self Management Training or Medical Nutrition Therapy  For those with diabetes, up to 10 hrs initial training within a year, subsequent years up to 2 hrs of follow up training Optional for those with diabetes     Medical Nutrition Therapy Three hours of one-on-one counseling in first year, two hours in subsequent years Optional for those with diabetes, kidney disease   Intensive Behavioral Therapy for Obesity  Face-to-face counseling, first month every week, month 2-6 every other week, month 7-12 every month if continued progress is documented Optional for those with Body Mass Index 30 or higher  Your Body mass index is 31.25 kg/m.   Tobacco Cessation (Quitting) Counseling   Two attempts per year, max 4 sessions per attempt, up to 8 per year, for those with tobacco-related health condition Optional for those that use tobacco   Cancer Screening   Colorectal screening   For anyone age 41 to 66 or any age if high risk:  Screening Colonoscopy every 10 yrs if low risk,  more frequent if higher risk  OR  Cologuard Stool DNA test once every 3 years OR  Fecal Occult Blood Testing yearly OR  Flexible  Sigmoidoscopy  every 5 yr OR  CT Colonography every 5 yrs    See your schedule below   Screening Pap Test Recommended every 3 years for all women age 92 to 32, or every five years if combined with HPV test (routine screening not needed after total hysterectomy).  Medicare covers every 2 years, up to yearly if high risk.  Screening Pelvic Exam Medicare covers every 2 years, yearly if high risk or childbearing age with abnormal Pap in last 3 yrs. See your schedule below   Screening Mammogram   Recommended every 2 years for women age 2 to 23, or more frequent if you have a higher risk. Selectively recommended for women between 40-49 based on shared decisions about risk. Covered by Medicare up to every year for women age 46 or older  See your schedule below      Lung Cancer Screening  Annual low dose computed tomography (LDCT scan) is recommended for those age 65-77 who smoked 20 pack-years and are current smokers or quit smoking within past 15 years (one pack-year= smoking one PPD for one year), after counseling by your doctor or nurse clinician about the possible benefits or harms. See your schedule below   Vaccinations   Pneumococcal Vaccine: Recommended routinely age 75+ with one or two separate vaccines based on your risk    Recommended before age 51 if medical conditions with increased risk  Seasonal Influenza Vaccine:  Once every flu season   Hepatitis B Vaccine: 3 doses if risk (including anyone with diabetes or liver disease)  Shingles Vaccine: Two doses at age 63 or older  Diphtheria Tetanus Pertussis Vaccine: ONCE as adult, booster every 10 years     Immunization History   Administered Date(s) Administered    Covid-19 Vaccine,Moderna,12 Years+ 08/11/2020, 09/08/2020    Tetanus Toxoid/Diphtheria Toxoid/Acellular Pertussis Vaccine, Adsorbed (Tdap) 01/09/2021     Shingles vaccine and Diphtheria Tetanus Pertussis vaccines are available at pharmacies or local health department without a prescription.   Other Screening   Bone Densitometry   Every 24 months for anyone at risk, including postmenopausal       Glaucoma Screening   Yearly if in high risk group such as diabetes, family history, African American age 73+ or Hispanic American age 64+   See your Eye Care Provider   Hepatitis C Screening recommended ONCE for those born between 1945-1965, or high risk for HCV infection       HIV Testing recommended routinely at least ONCE, covered every year for age 73 to 62 regardless of risk, and every year for age over 80 who ask for the test or higher risk  Yearly or up to 3 times in pregnancy         Abdominal Aortic Aneurysm Screening Ultrasound   Once between the age of 21-75 with a family history of AAA       Your Personalized Schedule for  Preventive Tests     Health Maintenance: Pending and Last Completed         Date Due Completion Date    Osteoporosis screening Never done ---    Hepatitis C screening Never done ---    Medicare Annual Wellness Visit 11/18/2023 11/17/2022    Adult Tdap-Td (2 - Td or Tdap) 01/10/2031 01/09/2021               Non-Opioid Treatment for Chronic Pains     Treatment for chronic pain can be managed without opioids. Below are non-opioid options that may be considered and discussed with your provider to determine which options would be best for your health.    Over the counter or presciptions medications:  Acetaminophen (Tylenol) or Non-steroidal anti-inflammatories such as: Ibuprofen (Motrin, Advil), naproxen (Aleve), aspirin  Antidepressants such as amitriptyline, nortriptyline (Pamelor),  Doxepin (Silenor), Imipramine (Tofranil) and others.  Anticonvulsant Nerve pain medications: Gabapentin (Neurontin), pregabalin (Lyrica)  Externally applied medications such as NSAID'S, lidocaine, capsaisin, and others  Injections: pain specialists can sometimes inject medications at the site of pain.    Alternative therapies such as  Acupuncture  Osteopathic manipulation  Chiropractic  Massage therapy           Amy B. Alvis PA-C

## 2022-11-18 ENCOUNTER — Encounter (INDEPENDENT_AMBULATORY_CARE_PROVIDER_SITE_OTHER): Payer: Self-pay | Admitting: Physician Assistant

## 2022-11-19 ENCOUNTER — Telehealth (INDEPENDENT_AMBULATORY_CARE_PROVIDER_SITE_OTHER): Payer: Self-pay | Admitting: Physician Assistant

## 2022-11-29 NOTE — ED Provider Notes (Signed)
 ED Provider Note    Subjective     Chief Complaint   Patient presents with   . Fall          . Knee Pain     bilateral   . Elbow Pain     left       Christine Mcmillan is a 77 y.o. female who presents to the ED for complaint of fall, knee pain, elbow pain. Pt w/ past medical hx significant for hypertension. Pt to ED via wheelchair c/o fall, lt elbow pain, and bilateral knee pain. She reports that she was walking out of her bathroom when she tripped on her dog and injured her bilateral knees, lt elbow, lt ankle, and lumbar region. She denies numbness, tingling, swelling, color change.     This patient was seen and examined while wearing PPE: N95 and gloves      History provided by:  Medical records and patient  Language interpreter used: No        Past Medical History:   Diagnosis Date   . Depression    . High cholesterol    . HTN (hypertension)    . Hypertension      Past Surgical History:   Procedure Laterality Date   . EXCIS INFRATENT BRAIN TUMOR     . REMV KIDNEY/URETER,SAME INCIS         Social History     Socioeconomic History   . Marital status: Divorced   Tobacco Use   . Smoking status: Never   . Smokeless tobacco: Never   Vaping Use   . Vaping Use: Never used   Substance and Sexual Activity   . Alcohol use: No   . Drug use: No   . Sexual activity: Not Currently     Objective     ED Triage Vitals [11/29/22 2205]   BP Pulse Resp Temp SpO2 Flow (L/min) (Oxygen Therapy)   139/89 92 20 97.2 F (36.2 C) 99 % --       Patient Vitals for the past 24 hrs:   BP Temp Pulse Resp SpO2 Height Weight   11/29/22 2341 (!) 156/92 97.8 F (36.6 C) 82 20 95 % -- --   11/29/22 2340 -- -- -- 20 -- -- --   11/29/22 2319 (!) 149/103 97.5 F (36.4 C) 84 20 95 % -- --   11/29/22 2312 -- -- -- 20 -- -- --   11/29/22 2248 -- -- -- 20 -- -- --   11/29/22 2205 139/89 97.2 F (36.2 C) 92 20 99 % 1.524 m (5') 72.1 kg (159 lb)       Physical Exam  Vitals and nursing note reviewed.   Constitutional:       General: She is not in acute  distress.     Appearance: Normal appearance. She is not ill-appearing, toxic-appearing or diaphoretic.   HENT:      Head: Normocephalic and atraumatic.      Nose: Nose normal.      Mouth/Throat:      Mouth: Mucous membranes are moist.      Pharynx: Oropharynx is clear.   Eyes:      Extraocular Movements: Extraocular movements intact.      Conjunctiva/sclera: Conjunctivae normal.      Pupils: Pupils are equal, round, and reactive to light.   Cardiovascular:      Rate and Rhythm: Normal rate and regular rhythm.      Pulses: Normal pulses.  Heart sounds: Normal heart sounds. No murmur heard.     No friction rub.   Pulmonary:      Effort: Pulmonary effort is normal. No respiratory distress.      Breath sounds: Normal breath sounds. No stridor. No wheezing, rhonchi or rales.   Musculoskeletal:      Left elbow: No swelling or deformity. Normal range of motion. No tenderness.      Cervical back: Normal, normal range of motion and neck supple. No deformity, tenderness or bony tenderness. Normal range of motion.      Thoracic back: Normal. No deformity, tenderness or bony tenderness. Normal range of motion.      Lumbar back: Normal. No swelling, deformity, tenderness or bony tenderness. Normal range of motion.      Right knee: No deformity, bony tenderness or crepitus. Normal range of motion. No tenderness. Normal pulse.      Left knee: No deformity, bony tenderness or crepitus. Normal range of motion. No tenderness. Normal pulse.      Left ankle: No deformity or ecchymosis. No tenderness. Normal range of motion. Normal pulse.      Comments: - Neuro/vasc intact throughout.    Skin:     General: Skin is warm and dry.      Coloration: Skin is not pale.   Neurological:      General: No focal deficit present.      Mental Status: She is alert and oriented to person, place, and time.      Cranial Nerves: No cranial nerve deficit.      Sensory: No sensory deficit.      Motor: No weakness.   Psychiatric:         Mood and Affect:  Mood normal.         Behavior: Behavior normal.         Thought Content: Thought content normal.         Judgment: Judgment normal.         Results for orders placed or performed during the hospital encounter of 11/29/22   XR KNEE RT 3 VWS    Narrative    EXAM:  XR right Knee, [3 View.     CLINICAL HISTORY:  (Fall with RT knee pain; Prior 02-08-22.)  trayna fall    COMPARISON:  04/20/19    FINDINGS:    BONES:  No acute fracture or focal osseous lesion.     JOINTS:  Mild suprapatellar joint effusion. No dislocation. 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    SOFT TISSUES:  The soft tissues are unremarkable.     IMPRESSION:    No acute fracture or dislocation.    Electronically signed by: Thana Ates, MD on 11/29/2022 10:58:33 PM US/[facility]   XR KNEE LT 3 VWS    Narrative    EXAM:  XR left Knee, [3 View.     CLINICAL HISTORY:  (all with LT knee pain; prior 11-02-22.)  trauma fall    COMPARISON:  04/20/19    FINDINGS:    BONES:  No acute fracture or focal osseous lesion.     JOINTS:  Minimal suprapatellar joint effusion. No dislocation. Severe degenerative joint disease is noted.    SOFT TISSUES:  The soft tissues are unremarkable.     IMPRESSION:    No acute fracture or dislocation. Follow up is recommended if needed.    Electronically signed by: Thana Ates, MD on 11/29/2022 11:02:37 PM [facility]   XR ELBOW  COMPLETE LT 3+ VW    Narrative    EXAM:  XR left Elbow, 4 View.     CLINICAL HISTORY:  Fall trauma, left elbow pain.    COMPARISON:  None provided.    FINDINGS:    BONES:  No acute fracture noted.  A tiny bony density noted overlying left elbow inner margin noted, appears to be nonspecific.    JOINTS:  No dislocation. Joint spaces normal. No radiographic evidence of a joint effusion.     SOFT TISSUES:  The soft tissues are unremarkable.     IMPRESSION:    No acute fracture or dislocation.    Electronically signed by: Thana Ates, MD on 11/29/2022 11:06:24 PM [facility]        Assessment and Plan:     Medical Decision Making  77 y/o female to ED c/o bilateral knee pain and lt elbow pain after a fall PTA. Denies head injury or LOC. Physical exam was unremarkable, no tenderness palpated and no deformity noted. Imaging negative for acute fracture, w/ arthritis noted in bilateral knees. Pt was given tylenol in ED for pain control. Results discussed w/ pt and she was d/c to home w/ Voltaren gel.    Bilateral chronic knee pain: acute illness or injury  Elbow pain, left: acute illness or injury  Fall, initial encounter: acute illness or injury  Amount and/or Complexity of Data Reviewed  Radiology: ordered. Decision-making details documented in ED Course.      Risk  OTC drugs.       "Risk" in the MDM section refers to billing criteria on potential for complications and/or morbidity/mortality of management as defined by the [facility] and CMS    Procedures     ED Course <redacted file path>:  ED Course as of 11/30/22 0331   Mon Nov 29, 2022   2215 XR ELBOW COMPLETE LT 3+ VW  Radiologist impression as follows: No acute fracture or dislocation. [LM]   2215 XR KNEE RT 3 VWS  Radiologist impression as follows: No acute fracture or dislocation. [LM]   2311 XR KNEE LT 3 VWS  Radiologist impression as follows: No acute fracture or dislocation. Follow up is recommended if needed. [LM]      ED Course User Index  [LM] Dulcy Fanny, Scribe     Medications   acetaminophen (TYLENOL) tablet 500 mg (500 mg Oral Given 11/29/22 2247)   ketorolac (TORADOL) injection 15 mg (15 mg IntraMUSCULAR Given 11/29/22 2323)     10:12 PM Pt seen and examined. Will obtain xray of lt elbow, lt knee, and rt knee. Will give 500 mg tylenol for pain control.      11:11 PM Imaging negative for acute fracture. Discussed results w/ pt and will d/c to home w/ rx for Voltaren gel. Pt verbalized understanding and agreement w/ plan.    Clinical Impression <redacted file path>:  1. Fall, initial encounter    2. Elbow pain, left    3. Bilateral  chronic knee pain        Condition: Stable and Improved    Disposition <redacted file path>: Discharge    Documentation prepared by Dulcy Fanny, Scribe acting as medical scribe for Collene Leyden MD. I was present and performed active documentation with the provider while the patient was being cared for in the emergency department. 11/29/2022 10:12 PM    Portions of this note were prepared by a scribe and I have reviewed with the scribe and verify the authenticity  of this record as a scribed note undertaken during the medical encounter. Collene Leyden, MD 11/30/2022 3:31 AM

## 2022-12-08 ENCOUNTER — Encounter (INDEPENDENT_AMBULATORY_CARE_PROVIDER_SITE_OTHER): Payer: Self-pay | Admitting: Physician Assistant

## 2022-12-08 ENCOUNTER — Other Ambulatory Visit: Payer: Self-pay

## 2022-12-08 ENCOUNTER — Ambulatory Visit: Payer: Medicare Other | Attending: Physician Assistant | Admitting: Physician Assistant

## 2022-12-08 ENCOUNTER — Other Ambulatory Visit (INDEPENDENT_AMBULATORY_CARE_PROVIDER_SITE_OTHER): Payer: Self-pay | Admitting: Physician Assistant

## 2022-12-08 VITALS — BP 118/72 | HR 63 | Temp 98.8°F | Wt 161.0 lb

## 2022-12-08 DIAGNOSIS — Z8701 Personal history of pneumonia (recurrent): Secondary | ICD-10-CM | POA: Insufficient documentation

## 2022-12-08 DIAGNOSIS — E785 Hyperlipidemia, unspecified: Secondary | ICD-10-CM | POA: Insufficient documentation

## 2022-12-08 DIAGNOSIS — R059 Cough, unspecified: Secondary | ICD-10-CM

## 2022-12-08 DIAGNOSIS — R5382 Chronic fatigue, unspecified: Secondary | ICD-10-CM | POA: Insufficient documentation

## 2022-12-08 DIAGNOSIS — F419 Anxiety disorder, unspecified: Secondary | ICD-10-CM | POA: Insufficient documentation

## 2022-12-08 DIAGNOSIS — R7309 Other abnormal glucose: Secondary | ICD-10-CM | POA: Insufficient documentation

## 2022-12-08 DIAGNOSIS — Z79899 Other long term (current) drug therapy: Secondary | ICD-10-CM | POA: Insufficient documentation

## 2022-12-08 DIAGNOSIS — I10 Essential (primary) hypertension: Secondary | ICD-10-CM

## 2022-12-08 DIAGNOSIS — R799 Abnormal finding of blood chemistry, unspecified: Secondary | ICD-10-CM | POA: Insufficient documentation

## 2022-12-08 DIAGNOSIS — F32A Depression, unspecified: Secondary | ICD-10-CM | POA: Insufficient documentation

## 2022-12-08 DIAGNOSIS — J9811 Atelectasis: Secondary | ICD-10-CM | POA: Insufficient documentation

## 2022-12-08 DIAGNOSIS — E559 Vitamin D deficiency, unspecified: Secondary | ICD-10-CM | POA: Insufficient documentation

## 2022-12-08 DIAGNOSIS — N390 Urinary tract infection, site not specified: Secondary | ICD-10-CM | POA: Insufficient documentation

## 2022-12-08 MED ORDER — LORAZEPAM 2 MG TABLET
2.0000 mg | ORAL_TABLET | Freq: Every evening | ORAL | 2 refills | Status: DC | PRN
Start: 2022-12-08 — End: 2023-01-05

## 2022-12-08 MED ORDER — ERGOCALCIFEROL (VITAMIN D2) 1,250 MCG (50,000 UNIT) CAPSULE
50000.0000 [IU] | ORAL_CAPSULE | ORAL | 2 refills | Status: DC
Start: 2022-12-08 — End: 2023-01-05

## 2022-12-08 MED ORDER — CYANOCOBALAMIN (VIT B-12) 1,000 MCG/ML INJECTION SOLUTION
1000.0000 ug | Freq: Once | INTRAMUSCULAR | Status: AC
Start: 2022-12-08 — End: 2022-12-08
  Administered 2022-12-08: 1000 ug via INTRAMUSCULAR

## 2022-12-08 MED ORDER — PROMETHAZINE-DM 6.25 MG-15 MG/5 ML ORAL SYRUP
5.0000 mL | ORAL_SOLUTION | Freq: Four times a day (QID) | ORAL | 0 refills | Status: AC | PRN
Start: 2022-12-08 — End: 2022-12-15

## 2022-12-08 MED ORDER — ACETAMINOPHEN 300 MG-CODEINE 30 MG TABLET
1.0000 | ORAL_TABLET | Freq: Four times a day (QID) | ORAL | 0 refills | Status: DC | PRN
Start: 2022-12-08 — End: 2023-01-05

## 2022-12-08 NOTE — Telephone Encounter (Signed)
Pt here for routine 5mfollow up for med refills

## 2022-12-08 NOTE — Nursing Note (Signed)
Pt received a B12 injection

## 2022-12-08 NOTE — Progress Notes (Signed)
[facility]INE[facility]  [address]  [place] [address]  Operated by [facility]  History and Physical     Name: Christine Mcmillan MRN:  [id number]   Date: 12/08/2022 Age: 77 y.o.               Follow Up        Reason for Visit: Follow Up  cough;  refills    History of Present Illness  Christine Mcmillan is a 77 y.o. female who is being seen today in office for follow-up with recent ER visit 2/5 due to continued cough.  It was noted chest x-ray revealed persistent  Infiltrative changes at the right base.  Left perihilar and right suprahilar subsegmental atelectasis.    Patient 1st noted to have pneumonia around 3 weeks ago and states she overall feels better but still continues to suffer from cough occasional chest congestion.  At this time she has not short of breath wheezing.  She denies fever chills.  Requesting refill on cough medication .    F/U BP/HTN w reading today 118/72  Pt is currently taking Norvasc 5mg  po daily along w bystolic 10 mg daily. She overall feels ok and denies CP headaches dizziness.    Follow-up cholesterol/lipid with patient currently on Zetia 10 mg nightly along with Crestor 10 mg nightly.  Is noted she is poorly compliant with Meds however states she has been taking them more religiously.  She is trying to monitor her diet as well.  No side effects or issues with medication noted follow-up labs needed.    Follow-up glucose with history of abnormal readings in the past.  Previous hemoglobin A1c however has been stable.  She does not monitor sugar outpatient but denies increased thirst or urination.  Follow-up labs needed.    Follow-up recurrent UTIs for which patient denies burning frequency pain or pressure at this time.  Patient states she is trying to drink better and stay hydrated.  Follow-up urinalysis needed.    Follow-up chronic anxiety/insomnia for which patient uses Ativan 2 mg nightly.  Patient has went back and forth from several  medications including Xanax Valium trazodone and still has issues sleeping as well as anxiety specifically in the evenings.  She is also taking Effexor 75 mg daily for her depression hx and feels like med has seemed to help. She previously was taking Lexapro.  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  She does tell me the neurologist told her to be very careful about the Ativan making her unsteady and falling which she denies ever having an issue with.    +chronic fatigue issues noted w pt having B12 inject in the past which helps and would like to get another today however we are currently out of B12 injections.    Continues to f/u w Dr Jacki Cones concerning cataracts    Referred to Dr Posey Pronto also concerning her GERD issues as well as colonoscopy but pt did not go to appt and wants to wait at this time    Refuses flu vac      PHQ Questionnaire         Past Medical History:   Diagnosis Date    Abnormal renal function     Anxiety     Arthralgia     Bell's palsy     Chronic hypokalemia     COVID-19 vaccine series completed  Depression     Esophageal reflux     History of memory loss     History of recurrent UTIs     Hypertension          Past Surgical History:   Procedure Laterality Date    BRAIN TUMOR EXCISION      HX LIPOMA RESECTION N/A     Lipoma on back      Family Medical History:       Problem Relation (Age of Onset)    Cancer Mother            Social History     Tobacco Use    Smoking status: Never    Smokeless tobacco: Never   Substance Use Topics    Alcohol use: Never    Drug use: Never     Medication:  amLODIPine (NORVASC) 5 mg Oral Tablet, Take 1 Tablet (5 mg total) by mouth Once a day  diclofenac sodium (PENNSAID) 20 mg/gram /actuation(2 %) solution in metered-dose pump, Apply 1 Application  topically Twice daily  ezetimibe (ZETIA) 10 mg Oral Tablet, Take 1 Tablet (10 mg total) by mouth Every evening  ezetimibe (ZETIA) 10 mg Oral Tablet, Take 1 Tablet (10  mg total) by mouth Every evening  furosemide (LASIX) 40 mg Oral Tablet, Take 1 Tablet (40 mg total) by mouth Once per day as needed for Other (Edema)  NALOXONE 4 MG/ACTUATION NASAL SPRAY, 1 Spray by INTRANASAL route Every 2 minutes as needed for actual or suspected opioid overdose. Call 911 if used.  nebivoloL (BYSTOLIC) 10 mg Oral Tablet, Take 1 Tablet (10 mg total) by mouth Once a day  omeprazole (PRILOSEC) 20 mg Oral Capsule, Delayed Release(E.C.), Take 1 Capsule (20 mg total) by mouth Once a day  pravastatin (PRAVACHOL) 80 mg Oral Tablet,   rizatriptan (MAXALT-MLT) 5 mg Oral Tablet, Rapid Dissolve, Take 1 Tablet (5 mg total) by mouth Once, as needed May repeat in 2 hours if needed.  rosuvastatin (CRESTOR) 10 mg Oral Tablet, Take 1 Tablet (10 mg total) by mouth Every evening  traZODone (DESYREL) 50 mg Oral Tablet, Take 1 Tablet (50 mg total) by mouth Every night  venlafaxine (EFFEXOR XR) 75 mg Oral Capsule, Sust. Release 24 hr,   acetaminophen-codeine (TYLENOL #3) 300-30 mg Oral Tablet, Take 1 Tablet by mouth Every 6 hours as needed  doxycycline hyclate (VIBRAMYCIN) 100 mg Oral Capsule, Take 1 Capsule (100 mg total) by mouth Twice daily  ergocalciferol, vitamin D2, (DRISDOL) 1,250 mcg (50,000 unit) Oral Capsule, Take 1 Capsule (50,000 Units total) by mouth Every 7 days  LORazepam (ATIVAN) 2 mg Oral Tablet, Take 1 Tablet (2 mg total) by mouth Every night as needed for Anxiety  promethazine-dextromethorphan (PHENERGAN-DM) 6.25-15 mg/5 mL Oral Syrup, Take 5 mL by mouth Four times a day as needed for Cough for up to 7 days    No facility-administered medications prior to visit.    Allergies:  Allergies   Allergen Reactions    Acetic Acid-Aluminum Acetate Swelling    Sertraline  Other Adverse Reaction (Add comment)     Headache         Physical Exam:  Vitals:    12/08/22 1457   BP: 118/72   Pulse: 63   Temp: 37.1 C (98.8 F)   TempSrc: Temporal   SpO2: 97%   Weight: 73 kg (161 lb)      Physical Exam  Vitals and  nursing note reviewed.   HENT:  Head: Atraumatic.      Right Ear: Tympanic membrane normal.      Left Ear: Tympanic membrane normal.      Mouth/Throat:      Mouth: Mucous membranes are moist.      Pharynx: Oropharynx is clear.      Comments: +chronic mild RT sided facial weakness due to previous bells however overall improved  Eyes:      Extraocular Movements: Extraocular movements intact.      Comments: +mild ptosis RT eye   Cardiovascular:      Rate and Rhythm: Normal rate and regular rhythm.      Pulses: Normal pulses.   Pulmonary:      Effort: Pulmonary effort is normal.      Breath sounds: Normal breath sounds. No wheezing or rhonchi.   Abdominal:      General: Bowel sounds are normal.      Palpations: Abdomen is soft.      Tenderness: There is no abdominal tenderness. There is no right CVA tenderness or left CVA tenderness.   Musculoskeletal:         General: No swelling. Normal range of motion.      Cervical back: Normal range of motion and neck supple.      Comments: +DJD bilat hands/knees  Left knee > RT w crepitus noted and mild genu varum   +decreased muscle tone all extrem  Gait stable   Skin:     General: Skin is warm.      Findings: No lesion or rash.      Comments: +scattered purpura bilat upper extrem   Neurological:      General: No focal deficit present.      Mental Status: She is alert and oriented to person, place, and time.      Cranial Nerves: No cranial nerve deficit.      Sensory: No sensory deficit.      Motor: No weakness.      Gait: Gait normal.   Psychiatric:         Mood and Affect: Mood normal.         Behavior: Behavior normal.         Thought Content: Thought content normal.         Judgment: Judgment normal.            Assessment/Plan:  Problem List Items Addressed This Visit       Essential hypertension    Relevant Orders    COMPREHENSIVE METABOLIC PNL, FASTING (Completed)    Anxiety and depression    Chronic fatigue    Relevant Orders    CBC/DIFF (Completed)    MAGNESIUM  (Completed)    THYROID STIMULATING HORMONE WITH FREE T4 REFLEX (Completed)    IRON TRANSFERRIN AND TIBC (Completed)    VITAMIN B12 (Completed)    FOLATE (Completed)    Abnormal glucose    Relevant Orders    HGA1C (HEMOGLOBIN A1C WITH EST AVG GLUCOSE) (Completed)    URINALYSIS, MACROSCOPIC AND MICROSCOPIC W/CULTURE REFLEX (Completed)    MICROALBUMIN/CREATININE RATIO, URINE, RANDOM    Hyperlipidemia    Relevant Orders    COMPREHENSIVE METABOLIC PNL, FASTING (Completed)    LIPID PANEL (Completed)     Other Visit Diagnoses       Cough, unspecified type    -  Primary    Relevant Orders    XR CHEST PA AND LATERAL    Atelectasis        Hx of  recurrent pneumonia        Relevant Orders    XR CHEST PA AND LATERAL    Recurrent UTI (urinary tract infection)        Relevant Orders    POCT URINE DIPSTICK    Vitamin D deficiency        Relevant Orders    VITAMIN D 25 TOTAL (Completed)    Long-term use of high-risk medication        Relevant Orders    DRUG SCREEN, NO CONFIRMATION, URINE    Abnormal finding of blood chemistry, unspecified        Relevant Orders    IRON TRANSFERRIN AND TIBC (Completed)            Labs obtained in office  Repeat chest x-ray ordered  Prometh DM p.r.n. cough  Cotinue hydration/proper hygiene  B12 shot given  Meds refilled as noted   Continue follow-up with Dr. Jacki Cones  as scheduled  Continue follow-up with neurologist as scheduled  Continue diet exercise weight management       Post-Discharge Follow Up Appointments       Wednesday Feb 02, 2023    Return Patient Visit with Cleophus Molt, PA-C at  2:00 PM      Thursday Mar 03, 2023    Return Patient Visit with Cleophus Molt, PA-C at 11:00 AM      Monday Nov 21, 2023    Return Telephone Visit with Cleophus Molt, PA-C at  1:00 PM      [facility]  [facility]  [address]  [place] [address]  [phone]         Seek medical attention for new or worsening symptoms.  Patient has been seen in this clinic within the last 3 years.      Juanda Luba, PA-C          This note was partially created using MModal Fluency Direct system (voice recognition software) and is inherently subject to errors including those of syntax and "sound-alike" substitutions which may escape proofreading.  In such instances, original meaning may be extrapolated by contextual derivation.

## 2022-12-08 NOTE — Nursing Note (Addendum)
Pt here for 41mCDM; pt states refills are needed.

## 2022-12-09 LAB — COMPREHENSIVE METABOLIC PNL, FASTING
ALBUMIN/GLOBULIN RATIO: 1.7 — ABNORMAL HIGH (ref 0.8–1.4)
ALBUMIN: 4.3 g/dL (ref 3.5–5.7)
ALKALINE PHOSPHATASE: 66 U/L (ref 34–104)
ALT (SGPT): 14 U/L (ref 7–52)
ANION GAP: 8 mmol/L (ref 4–13)
AST (SGOT): 21 U/L (ref 13–39)
BILIRUBIN TOTAL: 0.5 mg/dL (ref 0.3–1.2)
BUN/CREA RATIO: 13 (ref 6–22)
BUN: 15 mg/dL (ref 7–25)
CALCIUM, CORRECTED: 9.5 mg/dL (ref 8.9–10.8)
CALCIUM: 9.7 mg/dL (ref 8.6–10.3)
CHLORIDE: 104 mmol/L (ref 98–107)
CO2 TOTAL: 26 mmol/L (ref 21–31)
CREATININE: 1.19 mg/dL (ref 0.60–1.30)
ESTIMATED GFR: 47 mL/min/{1.73_m2} — ABNORMAL LOW (ref >59)
GLOBULIN: 2.6 — ABNORMAL LOW (ref 2.9–5.4)
GLUCOSE: 121 mg/dL — ABNORMAL HIGH (ref 74–109)
OSMOLALITY, CALCULATED: 278 mOsm/kg (ref 270–290)
POTASSIUM: 4.1 mmol/L (ref 3.5–5.1)
PROTEIN TOTAL: 6.9 g/dL (ref 6.4–8.9)
SODIUM: 138 mmol/L (ref 136–145)

## 2022-12-09 LAB — CBC WITH DIFF
BASOPHIL #: 0.1 10*3/uL (ref 0.00–0.10)
BASOPHIL %: 1 % (ref 0–1)
EOSINOPHIL #: 0.4 10*3/uL (ref 0.00–0.50)
EOSINOPHIL %: 3 %
HCT: 45.4 % — ABNORMAL HIGH (ref 31.2–41.9)
HGB: 15 g/dL — ABNORMAL HIGH (ref 10.9–14.3)
LYMPHOCYTE #: 3.5 10*3/uL — ABNORMAL HIGH (ref 1.00–3.00)
LYMPHOCYTE %: 26 % (ref 16–44)
MCH: 28.7 pg (ref 24.7–32.8)
MCHC: 33 g/dL (ref 32.3–35.6)
MCV: 87 fL (ref 75.5–95.3)
MONOCYTE #: 0.9 10*3/uL (ref 0.30–1.00)
MONOCYTE %: 7 % (ref 5–13)
MPV: 9.1 fL (ref 7.9–10.8)
NEUTROPHIL #: 8.7 10*3/uL — ABNORMAL HIGH (ref 1.85–7.80)
NEUTROPHIL %: 64 % (ref 43–77)
PLATELETS: 383 10*3/uL (ref 140–440)
RBC: 5.22 10*6/uL — ABNORMAL HIGH (ref 3.63–4.92)
RDW: 14.3 % (ref 12.3–17.7)
WBC: 13.6 10*3/uL — ABNORMAL HIGH (ref 3.8–11.8)

## 2022-12-09 LAB — LIPID PANEL
CHOL/HDL RATIO: 5.3
CHOLESTEROL: 267 mg/dL — ABNORMAL HIGH (ref <200)
HDL CHOL: 50 mg/dL (ref 23–92)
LDL CALC: 140 mg/dL — ABNORMAL HIGH (ref 0–100)
TRIGLYCERIDES: 385 mg/dL — ABNORMAL HIGH (ref <=150)
VLDL CALC: 77 mg/dL — ABNORMAL HIGH (ref 0–50)

## 2022-12-09 LAB — MAGNESIUM: MAGNESIUM: 2 mg/dL (ref 1.9–2.7)

## 2022-12-09 LAB — IRON TRANSFERRIN AND TIBC
IRON (TRANSFERRIN) SATURATION: 36 % (ref 15–50)
IRON: 116 ug/dL (ref 50–212)
TOTAL IRON BINDING CAPACITY: 326 ug/dL (ref 250–450)
TRANSFERRIN: 233 mg/dL (ref 203–362)
UIBC: 210 ug/dL (ref 130–375)

## 2022-12-09 LAB — THYROID STIMULATING HORMONE WITH FREE T4 REFLEX: TSH: 1.617 u[IU]/mL (ref 0.450–5.330)

## 2022-12-09 LAB — VITAMIN D 25 TOTAL: VITAMIN D: 18 ng/mL — ABNORMAL LOW (ref 30–100)

## 2022-12-09 LAB — FOLATE: FOLATE: 10.1 ng/mL (ref 5.9–24.4)

## 2022-12-09 LAB — VITAMIN B12: VITAMIN B 12: 393 pg/mL (ref 180–914)

## 2022-12-10 LAB — HGA1C (HEMOGLOBIN A1C WITH EST AVG GLUCOSE): HEMOGLOBIN A1C: 6 % (ref 4.0–6.0)

## 2022-12-13 ENCOUNTER — Other Ambulatory Visit (INDEPENDENT_AMBULATORY_CARE_PROVIDER_SITE_OTHER): Payer: Self-pay

## 2022-12-13 DIAGNOSIS — R7309 Other abnormal glucose: Secondary | ICD-10-CM

## 2022-12-14 ENCOUNTER — Encounter (INDEPENDENT_AMBULATORY_CARE_PROVIDER_SITE_OTHER): Payer: Self-pay | Admitting: Physician Assistant

## 2023-01-05 ENCOUNTER — Other Ambulatory Visit: Payer: Self-pay

## 2023-01-05 ENCOUNTER — Encounter (INDEPENDENT_AMBULATORY_CARE_PROVIDER_SITE_OTHER): Payer: Self-pay | Admitting: Physician Assistant

## 2023-01-05 ENCOUNTER — Ambulatory Visit: Payer: Medicare Other | Attending: Physician Assistant | Admitting: Physician Assistant

## 2023-01-05 DIAGNOSIS — N289 Disorder of kidney and ureter, unspecified: Secondary | ICD-10-CM

## 2023-01-05 DIAGNOSIS — E785 Hyperlipidemia, unspecified: Secondary | ICD-10-CM

## 2023-01-05 DIAGNOSIS — M1990 Unspecified osteoarthritis, unspecified site: Secondary | ICD-10-CM

## 2023-01-05 DIAGNOSIS — M25551 Pain in right hip: Secondary | ICD-10-CM

## 2023-01-05 DIAGNOSIS — M25562 Pain in left knee: Secondary | ICD-10-CM

## 2023-01-05 DIAGNOSIS — M25552 Pain in left hip: Secondary | ICD-10-CM

## 2023-01-05 DIAGNOSIS — F32A Depression, unspecified: Secondary | ICD-10-CM

## 2023-01-05 DIAGNOSIS — D72829 Elevated white blood cell count, unspecified: Secondary | ICD-10-CM

## 2023-01-05 DIAGNOSIS — F418 Other specified anxiety disorders: Secondary | ICD-10-CM

## 2023-01-05 DIAGNOSIS — I10 Essential (primary) hypertension: Secondary | ICD-10-CM

## 2023-01-05 DIAGNOSIS — G4700 Insomnia, unspecified: Secondary | ICD-10-CM

## 2023-01-05 DIAGNOSIS — M25561 Pain in right knee: Secondary | ICD-10-CM

## 2023-01-05 DIAGNOSIS — G8929 Other chronic pain: Secondary | ICD-10-CM

## 2023-01-05 DIAGNOSIS — F419 Anxiety disorder, unspecified: Secondary | ICD-10-CM

## 2023-01-05 MED ORDER — CYCLOBENZAPRINE 10 MG TABLET
10.0000 mg | ORAL_TABLET | Freq: Three times a day (TID) | ORAL | 0 refills | Status: DC | PRN
Start: 2023-01-05 — End: 2023-02-02

## 2023-01-05 MED ORDER — ERGOCALCIFEROL (VITAMIN D2) 1,250 MCG (50,000 UNIT) CAPSULE
50000.0000 [IU] | ORAL_CAPSULE | ORAL | 2 refills | Status: DC
Start: 2023-01-05 — End: 2023-03-03

## 2023-01-05 MED ORDER — LORAZEPAM 2 MG TABLET
2.0000 mg | ORAL_TABLET | Freq: Every evening | ORAL | 2 refills | Status: DC | PRN
Start: 2023-01-05 — End: 2023-03-03

## 2023-01-05 MED ORDER — ACETAMINOPHEN 300 MG-CODEINE 30 MG TABLET
1.0000 | ORAL_TABLET | Freq: Four times a day (QID) | ORAL | 0 refills | Status: DC | PRN
Start: 2023-01-05 — End: 2023-02-02

## 2023-01-05 NOTE — Progress Notes (Addendum)
INTERNAL MEDICINE, BUILDING A  [address]  [place] [address]  Operated by [facility]  History and Physical     Name: Christine Mcmillan MRN:  [id number]   Date: 01/05/2023 Age: 77 y.o.               Follow Up      Chief Complaint   Patient presents with    Medication Refill     Follow up        TELEMEDICINE DOCUMENTATION:    [facility]   Patient/family aware of provider location:  yes  Patient/family consent for telemedicine:  yes  Examination observed and performed by:  Cleophus Molt, PA-C     History of Present Illness  Christine Mcmillan is a 77 y.o. female who is being seen today via telemed concerning follow-up for her BP/HTN.  Previous reading in office 118/72.  Pt is currently taking Norvasc '5mg'$  po daily along w bystolic 10 mg daily. She overall feels ok and denies CP headaches dizziness.    Follow-up chronic anxiety/insomnia for which patient uses Ativan 2 mg nightly.  Patient has went back and forth from several medications including Xanax Valium trazodone and still has issues sleeping as well as anxiety specifically in the evenings.  She is also taking Effexor 75 mg daily for her depression hx and feels like med has seemed to help. She previously was taking Lexapro.  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  She does tell me the neurologist told her to be very careful about the Ativan making her unsteady and falling which she denies ever having an issue with.    Follow-up chronic bilat knees and hip pain w hx of DJD/arthritis noted. Pt states she is more stiff in the morning and her knees and hips just ache if she does too much during the day. Denies any red hot inflammed joints.  She was referred to orthopedic which she states has an appointment next Tuesday with.  She does have occasional muscle cramping pain in her legs as well would like prescription for Flexeril which she has had in the past and seems to  help.  She also is taking tylenol #3 currently for her pain and states it def helps  but does not completely take it away. She's denies any SE or sedation from the med.  Refill on Tylenol No. 3 needed as well today.    Patient's recent labs noted low GFR normal renal function with a history of renal insufficiency noted in the past as well as mild elevation in white count however patient has been on prednisone.  She has a history as well of abnormal cholesterol levels for which she needs follow-up labs concerning and was to have today but since unable to come in states she would like to wait till next appointment time and obtain then.    States she has a follow-up chest x-ray tomorrow concerning recent pneumonia follow-up    She was also referred to Dr Posey Pronto also concerning her GERD issues as well as colonoscopy and has an appointment next week.  Continues to follow with Dr. Jacki Cones concerning eye exams history of cataracts    Mammogram and bone density has been scheduled for next week also    Refuses flu vac      PHQ Questionnaire         Past Medical History:   Diagnosis Date  Abnormal renal function     Anxiety     Arthralgia     Bell's palsy     Chronic hypokalemia     COVID-19 vaccine series completed     Depression     Esophageal reflux     History of memory loss     History of recurrent UTIs     Hypertension          Past Surgical History:   Procedure Laterality Date    BRAIN TUMOR EXCISION      HX LIPOMA RESECTION N/A     Lipoma on back      Family Medical History:       Problem Relation (Age of Onset)    Cancer Mother            Social History     Tobacco Use    Smoking status: Never    Smokeless tobacco: Never   Substance Use Topics    Alcohol use: Never    Drug use: Never     Medication:  amLODIPine (NORVASC) 5 mg Oral Tablet, Take 1 Tablet (5 mg total) by mouth Once a day  diclofenac sodium (PENNSAID) 20 mg/gram /actuation(2 %) solution in metered-dose pump, Apply 1 Application  topically Twice  daily  ezetimibe (ZETIA) 10 mg Oral Tablet, Take 1 Tablet (10 mg total) by mouth Every evening  ezetimibe (ZETIA) 10 mg Oral Tablet, Take 1 Tablet (10 mg total) by mouth Every evening  furosemide (LASIX) 40 mg Oral Tablet, Take 1 Tablet (40 mg total) by mouth Once per day as needed for Other (Edema)  NALOXONE 4 MG/ACTUATION NASAL SPRAY, 1 Spray by INTRANASAL route Every 2 minutes as needed for actual or suspected opioid overdose. Call 911 if used.  nebivoloL (BYSTOLIC) 10 mg Oral Tablet, Take 1 Tablet (10 mg total) by mouth Once a day  omeprazole (PRILOSEC) 20 mg Oral Capsule, Delayed Release(E.C.), Take 1 Capsule (20 mg total) by mouth Once a day  pravastatin (PRAVACHOL) 80 mg Oral Tablet,   rizatriptan (MAXALT-MLT) 5 mg Oral Tablet, Rapid Dissolve, Take 1 Tablet (5 mg total) by mouth Once, as needed May repeat in 2 hours if needed.  rosuvastatin (CRESTOR) 10 mg Oral Tablet, Take 1 Tablet (10 mg total) by mouth Every evening  traZODone (DESYREL) 50 mg Oral Tablet, Take 1 Tablet (50 mg total) by mouth Every night  venlafaxine (EFFEXOR XR) 75 mg Oral Capsule, Sust. Release 24 hr,   acetaminophen-codeine (TYLENOL #3) 300-30 mg Oral Tablet, Take 1 Tablet by mouth Every 6 hours as needed  ergocalciferol, vitamin D2, (DRISDOL) 1,250 mcg (50,000 unit) Oral Capsule, Take 1 Capsule (50,000 Units total) by mouth Every 7 days  LORazepam (ATIVAN) 2 mg Oral Tablet, Take 1 Tablet (2 mg total) by mouth Every night as needed for Anxiety    No facility-administered medications prior to visit.    Allergies:  Allergies   Allergen Reactions    Acetic Acid-Aluminum Acetate Swelling    Sertraline  Other Adverse Reaction (Add comment)     Headache         Physical Exam:  There were no vitals filed for this visit.     Physical Exam  Constitutional:       Comments: Patient able to relay history today without issue   Pulmonary:      Effort: Pulmonary effort is normal.   Neurological:      Mental Status: She is alert.   Psychiatric:  Mood and Affect: Mood normal.         Assessment/Plan:  Problem List Items Addressed This Visit       Essential hypertension - Primary    Anxiety and depression    Chronic arthralgias of knees and hips    Insomnia    Abnormal renal function    Leukocytosis    Hyperlipidemia    Osteoarthritis       Patient will obtain labs upon next visit April attempt  Meds refilled as noted   Continue follow-up with ophthalmologist/GI/orthopedic and neurologist as scheduled   Continue diet exercise weight management  Will obtain mammogram bone density next week    Total time on visit 20 minutes       Post-Discharge Follow Up Appointments       Thursday Jan 13, 2023    Appointment at [facility] at  8:45 AM    Appointment at [place] at  8:45 AM      Wednesday Feb 02, 2023    Return Patient Visit with Cleophus Molt, PA-C at  2:00 PM      Thursday Mar 03, 2023    Return Patient Visit with Cleophus Molt, PA-C at 11:00 AM      Monday Nov 21, 2023    Return Telephone Visit with Cleophus Molt, PA-C at  1:00 PM      Internal Medicine, Building A  [facility]  [address]  [place] [address]  [phone]            Seek medical attention for new or worsening symptoms.  Patient has been seen in this clinic within the last 3 years.     Rohin Krejci, PA-C      THIS VISIT WAS PERFORMED AS A TELEPHONE VISIT     This note was partially created using MModal Fluency Direct system (voice recognition software) and is inherently subject to errors including those of syntax and "sound-alike" substitutions which may escape proofreading.  In such instances, original meaning may be extrapolated by contextual derivation.

## 2023-02-02 ENCOUNTER — Other Ambulatory Visit: Payer: Self-pay

## 2023-02-02 ENCOUNTER — Encounter (INDEPENDENT_AMBULATORY_CARE_PROVIDER_SITE_OTHER): Payer: Self-pay | Admitting: Physician Assistant

## 2023-02-02 ENCOUNTER — Ambulatory Visit: Payer: Medicare Other | Attending: Physician Assistant | Admitting: Physician Assistant

## 2023-02-02 ENCOUNTER — Other Ambulatory Visit (INDEPENDENT_AMBULATORY_CARE_PROVIDER_SITE_OTHER): Payer: Self-pay | Admitting: Physician Assistant

## 2023-02-02 VITALS — BP 146/96 | HR 88 | Temp 98.7°F

## 2023-02-02 DIAGNOSIS — R5382 Chronic fatigue, unspecified: Secondary | ICD-10-CM | POA: Insufficient documentation

## 2023-02-02 DIAGNOSIS — I10 Essential (primary) hypertension: Secondary | ICD-10-CM | POA: Insufficient documentation

## 2023-02-02 DIAGNOSIS — M21611 Bunion of right foot: Secondary | ICD-10-CM | POA: Insufficient documentation

## 2023-02-02 DIAGNOSIS — F32A Depression, unspecified: Secondary | ICD-10-CM

## 2023-02-02 DIAGNOSIS — G4700 Insomnia, unspecified: Secondary | ICD-10-CM | POA: Insufficient documentation

## 2023-02-02 DIAGNOSIS — F419 Anxiety disorder, unspecified: Secondary | ICD-10-CM | POA: Insufficient documentation

## 2023-02-02 DIAGNOSIS — J439 Emphysema, unspecified: Secondary | ICD-10-CM | POA: Insufficient documentation

## 2023-02-02 DIAGNOSIS — R053 Chronic cough: Secondary | ICD-10-CM

## 2023-02-02 MED ORDER — PROMETHAZINE-DM 6.25 MG-15 MG/5 ML ORAL SYRUP
5.0000 mL | ORAL_SOLUTION | Freq: Four times a day (QID) | ORAL | 0 refills | Status: AC | PRN
Start: 2023-02-02 — End: 2023-02-09

## 2023-02-02 MED ORDER — CYCLOBENZAPRINE 10 MG TABLET
10.0000 mg | ORAL_TABLET | Freq: Three times a day (TID) | ORAL | 1 refills | Status: DC | PRN
Start: 2023-02-02 — End: 2023-03-03

## 2023-02-02 MED ORDER — OMEPRAZOLE 20 MG CAPSULE,DELAYED RELEASE
20.0000 mg | DELAYED_RELEASE_CAPSULE | Freq: Every day | ORAL | 1 refills | Status: DC
Start: 2023-02-02 — End: 2023-08-01

## 2023-02-02 MED ORDER — FUROSEMIDE 40 MG TABLET
40.0000 mg | ORAL_TABLET | Freq: Every day | ORAL | 1 refills | Status: DC | PRN
Start: 2023-02-02 — End: 2023-08-01

## 2023-02-02 MED ORDER — ACETAMINOPHEN 300 MG-CODEINE 30 MG TABLET
1.0000 | ORAL_TABLET | Freq: Three times a day (TID) | ORAL | 0 refills | Status: DC
Start: 2023-02-02 — End: 2023-03-03

## 2023-02-02 MED ORDER — AMLODIPINE 5 MG TABLET
5.0000 mg | ORAL_TABLET | Freq: Every day | ORAL | 1 refills | Status: DC
Start: 2023-02-02 — End: 2023-03-31

## 2023-02-02 MED ORDER — CYANOCOBALAMIN (VIT B-12) 1,000 MCG/ML INJECTION SOLUTION
1000.0000 ug | Freq: Once | INTRAMUSCULAR | Status: AC
Start: 2023-02-02 — End: 2023-02-02
  Administered 2023-02-02: 1000 ug via INTRAMUSCULAR

## 2023-02-02 NOTE — Progress Notes (Signed)
INTERNAL MEDICINE, BUILDING A  [address]  [place] [address]  Operated by [facility]  History and Physical     Name: Christine Mcmillan MRN:  [id number]   Date: 02/02/2023 Age: 77 y.o.               Follow Up        Reason for Visit: Follow Up (Routine ) and Medication Refill  continued issues with cough; toe pain    History of Present Illness  Christine Mcmillan is a 77 y.o. female who is being seen today in office for follow-up with continued issues of chronic cough.  Patient has been seen in the  ER recently for similar complaints.  It was noted she had pneumonia several weeks back with chest x-ray revealing persistent  Infiltrative changes at the right base.  Left perihilar and right suprahilar subsegmental atelectasis.  She was treated with antibiotics inhalers as well as nebulizer treatments and overall improved  Upon last visit a repeat chest x-ray was ordered with patient obtaining yesterday.  Results noted:  XR CHEST PA AND LATERAL  Order: [id number]  Narrative    PROCEDURE: XR CHEST 2 VWS      HISTORY: pneumonia of right lower lobe due ti infectious organism Dx: Pneumonia of right lower lobe due to infectious organism .    COMPARISON: Chest x-ray dated 11/04/2022.Marland Kitchen    FINDINGS: Mild cardiomegaly and significant atherosclerotic aorta are noted. Lungs are free of acute process. Lungs are mildly hyperexpanded due to emphysema..    IMPRESSION: No acute cardiopulmonary abnormality. Mild cardiomegaly and atherosclerotic aorta. Mildly hyperexpanded lungs.    She has requesting a refill on her cough medication and we discussed today another referral to pulmonologist due to her continue complaints.  It is noted that patient has had issues with cough prior to this incident in years past at which time she was referred to pulmonologist concerning but never complied with appointments.  At this time no shortness a breath wheezing fever chills noted.  Patient tells me that since she has  Medicare in her insurance will approve it she promises to keep the appointment this time.    She also has complaints pain in her right toe with bunion noted.  Patient questioning what she could possibly do today to help with her bunion.  Patient says it times her 2nd toe gets sore as well but mainly because it rubs on her shoe.  We did discuss referral to podiatrist which she is wanting to wait on.    F/U BP/HTN w reading today high at 146/96 repeat 142/98.  Pt is currently taking Norvasc 5mg  po daily along w bystolic 10 mg daily and tells me she ran out of her Norvasc so this is probably why her blood pressure is elevated.  She needs a refill on medication today.  She overall feels ok and denies CP headaches dizziness.    Follow-up chronic anxiety/insomnia for which patient uses Ativan 2 mg nightly.  Patient has went back and forth from several medications including Xanax Valium trazodone and still has issues sleeping as well as anxiety specifically in the evenings.  She was also taking Effexor 75 mg daily for her depression but states she is now stopped it because she felt like it did not help.  She previously was taking Lexapro with therapy discontinued.  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  She does tell me  the neurologist told her to be very careful about the Ativan making her unsteady and falling which she denies ever having an issue with.  Sometime back did refer her to the pulmonologist have sleep studies but she did not comply with appointment.    Follow-up +chronic fatigue issues noted w pt having B12 inject in the past which helps and would like to get another today .    Continues to f/u w Dr Hessie Knows concerning cataracts    Referred to Dr Allena Katz also concerning her GERD issues as well as colonoscopy but pt did not go to appt and wants to wait at this time    Refuses flu vac      PHQ Questionnaire         Past Medical History:   Diagnosis Date     Abnormal renal function     Anxiety     Arthralgia     Bell's palsy     Chronic hypokalemia     COVID-19 vaccine series completed     Depression     Esophageal reflux     History of memory loss     History of recurrent UTIs     Hypertension          Past Surgical History:   Procedure Laterality Date    BRAIN TUMOR EXCISION      HX LIPOMA RESECTION N/A     Lipoma on back      Family Medical History:       Problem Relation (Age of Onset)    Cancer Mother            Social History     Tobacco Use    Smoking status: Never    Smokeless tobacco: Never   Substance Use Topics    Alcohol use: Never    Drug use: Never     Medication:  diclofenac sodium (PENNSAID) 20 mg/gram /actuation(2 %) solution in metered-dose pump, Apply 1 Application  topically Twice daily  ergocalciferol, vitamin D2, (DRISDOL) 1,250 mcg (50,000 unit) Oral Capsule, Take 1 Capsule (50,000 Units total) by mouth Every 7 days  ezetimibe (ZETIA) 10 mg Oral Tablet, Take 1 Tablet (10 mg total) by mouth Every evening  LORazepam (ATIVAN) 2 mg Oral Tablet, Take 1 Tablet (2 mg total) by mouth Every night as needed for Anxiety  NALOXONE 4 MG/ACTUATION NASAL SPRAY, 1 Spray by INTRANASAL route Every 2 minutes as needed for actual or suspected opioid overdose. Call 911 if used.  nebivoloL (BYSTOLIC) 10 mg Oral Tablet, Take 1 Tablet (10 mg total) by mouth Once a day  pravastatin (PRAVACHOL) 80 mg Oral Tablet,   rizatriptan (MAXALT-MLT) 5 mg Oral Tablet, Rapid Dissolve, Take 1 Tablet (5 mg total) by mouth Once, as needed May repeat in 2 hours if needed.  traZODone (DESYREL) 50 mg Oral Tablet, Take 1 Tablet (50 mg total) by mouth Every night  acetaminophen-codeine (TYLENOL #3) 300-30 mg Oral Tablet, Take 1 Tablet by mouth Every 6 hours as needed  amLODIPine (NORVASC) 5 mg Oral Tablet, Take 1 Tablet (5 mg total) by mouth Once a day  cyclobenzaprine (FLEXERIL) 10 mg Oral Tablet, Take 1 Tablet (10 mg total) by mouth Three times a day as needed for Muscle spasms  ezetimibe  (ZETIA) 10 mg Oral Tablet, Take 1 Tablet (10 mg total) by mouth Every evening  furosemide (LASIX) 40 mg Oral Tablet, Take 1 Tablet (40 mg total) by mouth Once per day as needed for Other (Edema)  omeprazole (PRILOSEC) 20 mg Oral Capsule, Delayed Release(E.C.), Take 1 Capsule (20 mg total) by mouth Once a day  rosuvastatin (CRESTOR) 10 mg Oral Tablet, Take 1 Tablet (10 mg total) by mouth Every evening  venlafaxine (EFFEXOR XR) 75 mg Oral Capsule, Sust. Release 24 hr,     No facility-administered medications prior to visit.    Allergies:  Allergies   Allergen Reactions    Acetic Acid-Aluminum Acetate Swelling    Sertraline  Other Adverse Reaction (Add comment)     Headache         Physical Exam:  Vitals:    02/02/23 1434   BP: (!) 146/96   Pulse: 88   Temp: 37.1 C (98.7 F)   SpO2: 98%      Physical Exam  Vitals and nursing note reviewed.   HENT:      Head: Atraumatic.      Right Ear: Tympanic membrane normal.      Left Ear: Tympanic membrane normal.      Mouth/Throat:      Mouth: Mucous membranes are moist.      Pharynx: Oropharynx is clear.      Comments: +chronic mild RT sided facial weakness due to previous bells however overall improved  Eyes:      Extraocular Movements: Extraocular movements intact.      Comments: +mild ptosis RT eye   Cardiovascular:      Rate and Rhythm: Normal rate and regular rhythm.      Pulses: Normal pulses.   Pulmonary:      Effort: Pulmonary effort is normal.      Breath sounds: Normal breath sounds. No wheezing or rhonchi.   Abdominal:      General: Bowel sounds are normal.      Palpations: Abdomen is soft.      Tenderness: There is no abdominal tenderness. There is no right CVA tenderness or left CVA tenderness.   Musculoskeletal:         General: No swelling. Normal range of motion.      Cervical back: Normal range of motion and neck supple.      Comments: +DJD bilat hands/knees  Left knee > RT w crepitus noted and mild genu varum   +decreased muscle tone all extrem  Gait  stable    Bunion noted right foot with significant curvature   Skin:     General: Skin is warm.      Findings: No lesion or rash.      Comments: +scattered purpura bilat upper extrem   Neurological:      General: No focal deficit present.      Mental Status: She is alert and oriented to person, place, and time.      Cranial Nerves: No cranial nerve deficit.      Sensory: No sensory deficit.      Motor: No weakness.      Gait: Gait normal.   Psychiatric:         Mood and Affect: Mood normal.         Behavior: Behavior normal.         Thought Content: Thought content normal.         Judgment: Judgment normal.          Assessment/Plan:  Problem List Items Addressed This Visit       Essential hypertension    Anxiety and depression    Insomnia    Chronic cough - Primary    Relevant Orders  Referral to External Provider (AMB)    Chronic fatigue    [facility]    Relevant Orders    Referral to External Provider (AMB)    Bunion of great toe of right foot         Labs up-to-date  Patient referred to pulmonologist for chronic cough  Continue Prometh DM p.r.n. cough  Patient encouraged to use wider shoe/gel pad percussion on 2nd toe to prevent ulceration/bunion toe separator  Refused podiatry referral at this time  B12 shot given  Meds refilled as noted   Continue blood pressure monitoring at home  Continue follow-up with Dr. Hessie KnowsBlaydes  as scheduled  Continue follow-up with neurologist as scheduled  Encouraged to make  appointment with GI concerning colonoscopy with referral already made  Continue diet exercise weight management  Follow-up as scheduled     Post-Discharge Follow Up Appointments         Thursday Mar 03, 2023    Return Patient Visit with Eyvonne Mechaniclvis, Christine Radebaugh, PA-C at 11:00 AM      Monday Nov 21, 2023    Return Telephone Visit with Eyvonne MechanicAlvis, Christine Cirrincione, PA-C at  1:00 PM      Internal Medicine, Building A  [facility], [place]  [address]  [place] [id number]  [phone]         Seek medical attention for  new or worsening symptoms.  Patient has been seen in this clinic within the last 3 years.     Christine Avallone, PA-C          This note was partially created using MModal Fluency Direct system (voice recognition software) and is inherently subject to errors including those of syntax and "sound-alike" substitutions which may escape proofreading.  In such instances, original meaning may be extrapolated by contextual derivation.

## 2023-02-02 NOTE — Telephone Encounter (Signed)
Patient is here for 1m CDM for medication refills

## 2023-02-02 NOTE — Nursing Note (Signed)
Patient is here for 3m CDM for med refills.

## 2023-02-24 ENCOUNTER — Telehealth (INDEPENDENT_AMBULATORY_CARE_PROVIDER_SITE_OTHER): Payer: Self-pay | Admitting: Physician Assistant

## 2023-03-03 ENCOUNTER — Other Ambulatory Visit: Payer: Self-pay

## 2023-03-03 ENCOUNTER — Ambulatory Visit: Payer: Medicare Other | Attending: Physician Assistant | Admitting: Physician Assistant

## 2023-03-03 ENCOUNTER — Encounter (INDEPENDENT_AMBULATORY_CARE_PROVIDER_SITE_OTHER): Payer: Self-pay | Admitting: Physician Assistant

## 2023-03-03 ENCOUNTER — Other Ambulatory Visit (INDEPENDENT_AMBULATORY_CARE_PROVIDER_SITE_OTHER): Payer: Self-pay | Admitting: Physician Assistant

## 2023-03-03 DIAGNOSIS — F419 Anxiety disorder, unspecified: Secondary | ICD-10-CM

## 2023-03-03 DIAGNOSIS — I10 Essential (primary) hypertension: Secondary | ICD-10-CM

## 2023-03-03 DIAGNOSIS — M79605 Pain in left leg: Secondary | ICD-10-CM

## 2023-03-03 DIAGNOSIS — M79604 Pain in right leg: Secondary | ICD-10-CM

## 2023-03-03 DIAGNOSIS — F32A Depression, unspecified: Secondary | ICD-10-CM

## 2023-03-03 DIAGNOSIS — G4700 Insomnia, unspecified: Secondary | ICD-10-CM

## 2023-03-03 DIAGNOSIS — M1990 Unspecified osteoarthritis, unspecified site: Secondary | ICD-10-CM

## 2023-03-03 MED ORDER — LORAZEPAM 2 MG TABLET
2.0000 mg | ORAL_TABLET | Freq: Every evening | ORAL | 2 refills | Status: DC | PRN
Start: 2023-03-03 — End: 2023-03-31

## 2023-03-03 MED ORDER — ERGOCALCIFEROL (VITAMIN D2) 1,250 MCG (50,000 UNIT) CAPSULE
50000.0000 [IU] | ORAL_CAPSULE | ORAL | 2 refills | Status: DC
Start: 2023-03-03 — End: 2023-08-01

## 2023-03-03 MED ORDER — CYCLOBENZAPRINE 10 MG TABLET
10.0000 mg | ORAL_TABLET | Freq: Three times a day (TID) | ORAL | 1 refills | Status: DC | PRN
Start: 2023-03-03 — End: 2023-03-31

## 2023-03-03 MED ORDER — PROMETHAZINE-DM 6.25 MG-15 MG/5 ML ORAL SYRUP
5.0000 mL | ORAL_SOLUTION | Freq: Four times a day (QID) | ORAL | 0 refills | Status: DC | PRN
Start: 2023-03-03 — End: 2023-03-31

## 2023-03-03 MED ORDER — ACETAMINOPHEN 300 MG-CODEINE 30 MG TABLET
1.0000 | ORAL_TABLET | Freq: Three times a day (TID) | ORAL | 0 refills | Status: DC
Start: 2023-03-03 — End: 2023-03-31

## 2023-03-03 NOTE — Telephone Encounter (Signed)
Patient completed routine 1 month follow up for medication refill.

## 2023-03-03 NOTE — Progress Notes (Signed)
INTERNAL MEDICINE, BUILDING A  [address]  [place] [address]  Operated by [facility]  History and Physical     Name: Christine Mcmillan MRN:  [id number]   Date: 03/03/2023 Age: 77 y.o.               Follow Up      Chief Complaint   Patient presents with    Medication Refill     Follow-up      TELEMEDICINE DOCUMENTATION:    [facility]   Patient/family aware of provider location:  yes  Patient/family consent for telemedicine:  yes  Examination observed and performed by:  Eyvonne Mechanic, PA-C     History of Present Illness  Christine Mcmillan is a 77 y.o. female who is being seen today via telemed concerning follow-up for her BP/HTN.  Previous reading in office stable.  Pt is currently taking Norvasc 5mg  po daily along w bystolic 10 mg daily. She overall feels ok and denies CP headaches dizziness.    Follow-up chronic anxiety/insomnia for which patient uses Ativan 2 mg nightly.  Patient has went back and forth from several medications including Xanax Valium trazodone and still has issues sleeping as well as anxiety specifically in the evenings.  She is also taking Effexor 75 mg daily for her depression hx and feels like med has seemed to help. She previously was taking Lexapro.  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  She does tell me the neurologist told her to be very careful about the Ativan making her unsteady and falling which she denies ever having an issue with.    Follow-up chronic bilat knees and hip pain w hx of DJD/arthritis noted. Pt states she is more stiff in the morning and her knees and hips just ache if she does too much during the day. Denies any red hot inflammed joints.  She was referred to orthopedic and has been receiving injection.  She does state the Flexeril has helped some with the muscle cramping in her legs so would like a refill.  She also is taking tylenol #3 currently for her pain and states  it def helps  but does not completely take it away. She's denies any SE or sedation from the med.  Requesting medication refills.      PHQ Questionnaire         Past Medical History:   Diagnosis Date    Abnormal renal function     Anxiety     Arthralgia     Bell's palsy     Chronic hypokalemia     COVID-19 vaccine series completed     Depression     Esophageal reflux     History of memory loss     History of recurrent UTIs     Hypertension          Past Surgical History:   Procedure Laterality Date    BRAIN TUMOR EXCISION      HX LIPOMA RESECTION N/A     Lipoma on back      Family Medical History:       Problem Relation (Age of Onset)    Cancer Mother            Social History     Tobacco Use    Smoking status: Never    Smokeless tobacco: Never   Substance Use Topics    Alcohol use: Never  Drug use: Never     Medication:  amLODIPine (NORVASC) 5 mg Oral Tablet, Take 1 Tablet (5 mg total) by mouth Once a day  diclofenac sodium (PENNSAID) 20 mg/gram /actuation(2 %) solution in metered-dose pump, Apply 1 Application  topically Twice daily  ezetimibe (ZETIA) 10 mg Oral Tablet, Take 1 Tablet (10 mg total) by mouth Every evening  furosemide (LASIX) 40 mg Oral Tablet, Take 1 Tablet (40 mg total) by mouth Once per day as needed for Other (Edema)  NALOXONE 4 MG/ACTUATION NASAL SPRAY, 1 Spray by INTRANASAL route Every 2 minutes as needed for actual or suspected opioid overdose. Call 911 if used.  nebivoloL (BYSTOLIC) 10 mg Oral Tablet, Take 1 Tablet (10 mg total) by mouth Once a day  omeprazole (PRILOSEC) 20 mg Oral Capsule, Delayed Release(E.C.), Take 1 Capsule (20 mg total) by mouth Once a day  pravastatin (PRAVACHOL) 80 mg Oral Tablet,   rizatriptan (MAXALT-MLT) 5 mg Oral Tablet, Rapid Dissolve, Take 1 Tablet (5 mg total) by mouth Once, as needed May repeat in 2 hours if needed.  traZODone (DESYREL) 50 mg Oral Tablet, Take 1 Tablet (50 mg total) by mouth Every night  acetaminophen-codeine (TYLENOL #3) 300-30 mg Oral  Tablet, Take 1 Tablet by mouth Three times a day for 30 days  cyclobenzaprine (FLEXERIL) 10 mg Oral Tablet, Take 1 Tablet (10 mg total) by mouth Three times a day as needed for Muscle spasms  ergocalciferol, vitamin D2, (DRISDOL) 1,250 mcg (50,000 unit) Oral Capsule, Take 1 Capsule (50,000 Units total) by mouth Every 7 days  LORazepam (ATIVAN) 2 mg Oral Tablet, Take 1 Tablet (2 mg total) by mouth Every night as needed for Anxiety    No facility-administered medications prior to visit.    Allergies:  Allergies   Allergen Reactions    Acetic Acid-Aluminum Acetate Swelling    Sertraline  Other Adverse Reaction (Add comment)     Headache         Physical Exam:  There were no vitals filed for this visit.     Physical Exam  Constitutional:       Comments: Patient able to relay history today without issue   Pulmonary:      Effort: Pulmonary effort is normal.   Neurological:      Mental Status: She is alert.   Psychiatric:         Mood and Affect: Mood normal.         Assessment/Plan:  Problem List Items Addressed This Visit       Osteoarthritis - Primary           Meds refilled as noted   Continue follow-up with ophthalmologist/GI/orthopedic and neurologist as scheduled   Continue diet exercise weight management      Total time on visit 17 minutes       Post-Discharge Follow Up Appointments       Thursday Mar 31, 2023    Return Patient Visit with Eyvonne Mechanic, PA-C at  2:30 PM      Wednesday Apr 27, 2023    Return Patient Visit with Eyvonne Mechanic, PA-C at 11:30 AM      Monday May 16, 2023    Appointment with Theda Belfast, MD at  9:00 AM      Wednesday May 25, 2023    Return Patient Visit with Eyvonne Mechanic, PA-C at 11:30 AM      Wednesday Jun 22, 2023    Return Patient Visit with Eyvonne Mechanic, PA-C  at  1:30 PM      Wednesday Jul 20, 2023    Return Patient Visit with Eyvonne Mechanic, PA-C at  1:00 PM      Monday Nov 21, 2023    Return Telephone Visit with Eyvonne Mechanic, PA-C at  1:00 PM      Internal Medicine, Building A  [facility]  [address]  [place] [address]  [phone]            Seek medical attention for new or worsening symptoms.  Patient has been seen in this clinic within the last 3 years.     Tammatha Cobb, PA-C      THIS VISIT WAS PERFORMED AS A TELEPHONE VISIT     This note was partially created using MModal Fluency Direct system (voice recognition software) and is inherently subject to errors including those of syntax and "sound-alike" substitutions which may escape proofreading.  In such instances, original meaning may be extrapolated by contextual derivation.

## 2023-03-31 ENCOUNTER — Other Ambulatory Visit (INDEPENDENT_AMBULATORY_CARE_PROVIDER_SITE_OTHER): Payer: Self-pay | Admitting: Physician Assistant

## 2023-03-31 ENCOUNTER — Ambulatory Visit: Payer: Medicare Other | Attending: Physician Assistant | Admitting: Physician Assistant

## 2023-03-31 ENCOUNTER — Encounter (INDEPENDENT_AMBULATORY_CARE_PROVIDER_SITE_OTHER): Payer: Self-pay | Admitting: Physician Assistant

## 2023-03-31 ENCOUNTER — Other Ambulatory Visit: Payer: Self-pay

## 2023-03-31 VITALS — BP 128/68 | HR 73 | Temp 97.8°F | Wt 160.0 lb

## 2023-03-31 DIAGNOSIS — R7309 Other abnormal glucose: Secondary | ICD-10-CM | POA: Insufficient documentation

## 2023-03-31 DIAGNOSIS — I10 Essential (primary) hypertension: Secondary | ICD-10-CM

## 2023-03-31 DIAGNOSIS — E785 Hyperlipidemia, unspecified: Secondary | ICD-10-CM

## 2023-03-31 DIAGNOSIS — R5382 Chronic fatigue, unspecified: Secondary | ICD-10-CM

## 2023-03-31 DIAGNOSIS — F32A Anxiety disorder, unspecified: Secondary | ICD-10-CM

## 2023-03-31 DIAGNOSIS — G8929 Other chronic pain: Secondary | ICD-10-CM | POA: Insufficient documentation

## 2023-03-31 DIAGNOSIS — R053 Chronic cough: Secondary | ICD-10-CM

## 2023-03-31 DIAGNOSIS — F419 Anxiety disorder, unspecified: Secondary | ICD-10-CM | POA: Insufficient documentation

## 2023-03-31 DIAGNOSIS — M549 Dorsalgia, unspecified: Secondary | ICD-10-CM

## 2023-03-31 DIAGNOSIS — G4700 Insomnia, unspecified: Secondary | ICD-10-CM

## 2023-03-31 MED ORDER — PREDNISONE 20 MG TABLET
20.0000 mg | ORAL_TABLET | Freq: Two times a day (BID) | ORAL | 0 refills | Status: AC
Start: 2023-03-31 — End: 2023-04-05

## 2023-03-31 MED ORDER — LORAZEPAM 2 MG TABLET
2.0000 mg | ORAL_TABLET | Freq: Every evening | ORAL | 2 refills | Status: DC | PRN
Start: 2023-03-31 — End: 2023-04-26

## 2023-03-31 MED ORDER — PROMETHAZINE-DM 6.25 MG-15 MG/5 ML ORAL SYRUP
5.0000 mL | ORAL_SOLUTION | Freq: Four times a day (QID) | ORAL | 0 refills | Status: DC | PRN
Start: 2023-03-31 — End: 2023-05-04

## 2023-03-31 MED ORDER — CYCLOBENZAPRINE 10 MG TABLET
10.0000 mg | ORAL_TABLET | Freq: Three times a day (TID) | ORAL | 1 refills | Status: DC | PRN
Start: 2023-03-31 — End: 2023-05-25

## 2023-03-31 MED ORDER — ACETAMINOPHEN 300 MG-CODEINE 30 MG TABLET
1.0000 | ORAL_TABLET | Freq: Three times a day (TID) | ORAL | 0 refills | Status: DC
Start: 2023-03-31 — End: 2023-04-26

## 2023-03-31 MED ORDER — NEBIVOLOL 10 MG TABLET
10.0000 mg | ORAL_TABLET | Freq: Every day | ORAL | 1 refills | Status: DC
Start: 2023-03-31 — End: 2023-04-26

## 2023-03-31 MED ORDER — EZETIMIBE 10 MG TABLET
10.0000 mg | ORAL_TABLET | Freq: Every evening | ORAL | 5 refills | Status: DC
Start: 2023-03-31 — End: 2023-08-01

## 2023-03-31 MED ORDER — CYANOCOBALAMIN (VIT B-12) 1,000 MCG/ML INJECTION SOLUTION
1000.0000 ug | INTRAMUSCULAR | Status: AC
Start: 2023-03-31 — End: 2023-03-31
  Administered 2023-03-31: 1000 ug via SUBCUTANEOUS

## 2023-03-31 MED ORDER — AMLODIPINE 5 MG TABLET
5.0000 mg | ORAL_TABLET | Freq: Every day | ORAL | 1 refills | Status: DC
Start: 2023-03-31 — End: 2023-06-28

## 2023-03-31 NOTE — Telephone Encounter (Signed)
Patient  is here for routine follow up for routine medication refills

## 2023-03-31 NOTE — Nursing Note (Signed)
Patient is here for 88m CDM.Marland Kitchen pt wants to discuss back pain with provider

## 2023-03-31 NOTE — Progress Notes (Signed)
INTERNAL MEDICINE, BUILDING A  [address]  [place] [address]  Operated by [facility]  History and Physical     Name: Christine Mcmillan MRN:  [id number]   Date: 03/31/2023 Age: 77 y.o.               Follow Up        Reason for Visit: Follow Up (Routine 73m CDM)  increased back pain    History of Present Illness  Christine Mcmillan is a 77 y.o. female who is being seen today in office for follow-up however has complaints of continued lower back pain increased for the past several days.  Once again she has had issues now for some time was ordered an MRI of her lower back but states she missed the appointment scheduled for her have at [facility].  Patient has not rescheduled test requests that we do so today for her.  She denies any recent fall or injury.  She does have pain radiating down both legs at times however left greater than right.  History of arthritis also noted.  Patient does currently have Tylenol No. 3 she is using p.r.n. pain and states that it helps significantly.  She also has Flexeril which she has used as well in combination with the Tylenol and no side effects or sedation noted.  Medication refills requested.      F/U BP/HTN w reading today excellent 128/68.  Pt is currently taking Norvasc 5mg  po daily along w bystolic 10 mg daily.  Blood pressure has been stable when checked at home as well.  Denies chest pain headaches dizziness.    Follow-up cholesterol/lipids which patient is currently on Pravachol 80 mg nightly along with Zetia 10 mg nightly.  At this time she has been side effects or issues with medication follow-up labs needed.    Follow-up abnormal glucose with out history of diabetes noted.  She denies increased thirst or urination and follow-up labs needed.    Follow-up chronic anxiety/insomnia for which patient uses Ativan 2 mg nightly.  Patient has went back and forth from several medications including Xanax Valium trazodone and still has issues  sleeping as well as anxiety specifically in the evenings.  She is used Lexapro as well as Effexor in the past for her depression but stopped medication after stating she did not feel like it was helping.  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  She does tell me the neurologist told her to be very careful about the Ativan making her unsteady and falling which she denies ever having an issue with.  Sometime back did refer her to the pulmonologist have sleep studies but she did not comply with appointment.    Follow-up chronic cough for which she has had for numerous years and was referred to the pulmonologist who she states she has an appointment with next month.  She is prescription for promethazine DM she uses for the chronic cough and requests refills today.  She has had pneumonia in the past but currently no chest congestion shortness a breath wheezing noted.    Follow-up +chronic fatigue issues noted w pt having B12 inject in the past which helps and would like to get another today .    Continues to f/u w Dr Hessie Knows concerning cataracts    Referred to Dr Allena Katz also concerning her GERD issues as well as colonoscopy but pt did not go to appt  and wants to wait at this time    Refuses flu vac      PHQ Questionnaire         Past Medical History:   Diagnosis Date    Abnormal renal function     Anxiety     Arthralgia     Bell's palsy     Chronic hypokalemia     COVID-19 vaccine series completed     Depression     Esophageal reflux     History of memory loss     History of recurrent UTIs     Hypertension          Past Surgical History:   Procedure Laterality Date    BRAIN TUMOR EXCISION      HX LIPOMA RESECTION N/A     Lipoma on back      Family Medical History:       Problem Relation (Age of Onset)    Cancer Mother            Social History     Tobacco Use    Smoking status: Never    Smokeless tobacco: Never   Substance Use Topics    Alcohol use: Never    Drug use:  Never     Medication:  diclofenac sodium (PENNSAID) 20 mg/gram /actuation(2 %) solution in metered-dose pump, Apply 1 Application  topically Twice daily  ergocalciferol, vitamin D2, (DRISDOL) 1,250 mcg (50,000 unit) Oral Capsule, Take 1 Capsule (50,000 Units total) by mouth Every 7 days  furosemide (LASIX) 40 mg Oral Tablet, Take 1 Tablet (40 mg total) by mouth Once per day as needed for Other (Edema)  NALOXONE 4 MG/ACTUATION NASAL SPRAY, 1 Spray by INTRANASAL route Every 2 minutes as needed for actual or suspected opioid overdose. Call 911 if used.  omeprazole (PRILOSEC) 20 mg Oral Capsule, Delayed Release(E.C.), Take 1 Capsule (20 mg total) by mouth Once a day  pravastatin (PRAVACHOL) 80 mg Oral Tablet,   rizatriptan (MAXALT-MLT) 5 mg Oral Tablet, Rapid Dissolve, Take 1 Tablet (5 mg total) by mouth Once, as needed May repeat in 2 hours if needed.  traZODone (DESYREL) 50 mg Oral Tablet, Take 1 Tablet (50 mg total) by mouth Every night  acetaminophen-codeine (TYLENOL #3) 300-30 mg Oral Tablet, Take 1 Tablet by mouth Three times a day for 30 days  amLODIPine (NORVASC) 5 mg Oral Tablet, Take 1 Tablet (5 mg total) by mouth Once a day  cyclobenzaprine (FLEXERIL) 10 mg Oral Tablet, Take 1 Tablet (10 mg total) by mouth Three times a day as needed for Muscle spasms  ezetimibe (ZETIA) 10 mg Oral Tablet, Take 1 Tablet (10 mg total) by mouth Every evening  LORazepam (ATIVAN) 2 mg Oral Tablet, Take 1 Tablet (2 mg total) by mouth Every night as needed for Anxiety  nebivoloL (BYSTOLIC) 10 mg Oral Tablet, Take 1 Tablet (10 mg total) by mouth Once a day  promethazine-dextromethorphan (PHENERGAN-DM) 6.25-15 mg/5 mL Oral Syrup, Take 5 mL by mouth Four times a day as needed for Cough for up to 7 days    No facility-administered medications prior to visit.    Allergies:  Allergies   Allergen Reactions    Acetic Acid-Aluminum Acetate Swelling    Sertraline  Other Adverse Reaction (Add comment)     Headache         Physical  Exam:  Vitals:    03/31/23 1424   BP: 128/68   Pulse: 73   Temp: 36.6 C (97.8 F)  SpO2: 98%   Weight: 72.6 kg (160 lb)      Physical Exam  Vitals and nursing note reviewed.   HENT:      Head: Atraumatic.      Right Ear: Tympanic membrane normal.      Left Ear: Tympanic membrane normal.      Mouth/Throat:      Mouth: Mucous membranes are moist.      Pharynx: Oropharynx is clear.      Comments: +chronic mild RT sided facial weakness due to previous bells however overall improved  Eyes:      Extraocular Movements: Extraocular movements intact.      Comments: +mild ptosis RT eye   Cardiovascular:      Rate and Rhythm: Normal rate and regular rhythm.      Pulses: Normal pulses.   Pulmonary:      Effort: Pulmonary effort is normal.      Breath sounds: Normal breath sounds. No wheezing or rhonchi.   Abdominal:      General: Bowel sounds are normal.      Palpations: Abdomen is soft.      Tenderness: There is no abdominal tenderness. There is no right CVA tenderness or left CVA tenderness.   Musculoskeletal:         General: No swelling. Normal range of motion.      Cervical back: Normal range of motion and neck supple.      Comments: +DJD bilat hands/knees  Left knee > RT w crepitus noted and mild genu varum   +decreased muscle tone all extrem  Decreased range of motion/flexion lower back due to pain  Positive straight leg raise on left at 80  Gait stable    Bunion noted right foot with significant curvature   Skin:     General: Skin is warm.      Findings: No lesion or rash.      Comments: +scattered purpura bilat upper extrem   Neurological:      General: No focal deficit present.      Mental Status: She is alert and oriented to person, place, and time.      Cranial Nerves: No cranial nerve deficit.      Sensory: No sensory deficit.      Motor: No weakness.      Gait: Gait normal.   Psychiatric:         Mood and Affect: Mood normal.         Behavior: Behavior normal.         Thought Content: Thought content normal.          Judgment: Judgment normal.          Assessment/Plan:  Problem List Items Addressed This Visit       Essential hypertension    Relevant Orders    CBC/DIFF    COMPREHENSIVE METABOLIC PNL, FASTING    HGA1C (HEMOGLOBIN A1C WITH EST AVG GLUCOSE)    URINALYSIS, MACROSCOPIC AND MICROSCOPIC W/CULTURE REFLEX    LIPID PANEL    MICROALBUMIN/CREATININE RATIO, URINE, RANDOM    Anxiety and depression    Relevant Orders    CBC/DIFF    COMPREHENSIVE METABOLIC PNL, FASTING    HGA1C (HEMOGLOBIN A1C WITH EST AVG GLUCOSE)    URINALYSIS, MACROSCOPIC AND MICROSCOPIC W/CULTURE REFLEX    LIPID PANEL    MICROALBUMIN/CREATININE RATIO, URINE, RANDOM    Insomnia    Relevant Orders    CBC/DIFF    COMPREHENSIVE METABOLIC PNL, FASTING  HGA1C (HEMOGLOBIN A1C WITH EST AVG GLUCOSE)    URINALYSIS, MACROSCOPIC AND MICROSCOPIC W/CULTURE REFLEX    LIPID PANEL    MICROALBUMIN/CREATININE RATIO, URINE, RANDOM    Chronic cough    Relevant Orders    CBC/DIFF    COMPREHENSIVE METABOLIC PNL, FASTING    HGA1C (HEMOGLOBIN A1C WITH EST AVG GLUCOSE)    URINALYSIS, MACROSCOPIC AND MICROSCOPIC W/CULTURE REFLEX    LIPID PANEL    MICROALBUMIN/CREATININE RATIO, URINE, RANDOM    Chronic fatigue    Abnormal glucose    Relevant Orders    CBC/DIFF    COMPREHENSIVE METABOLIC PNL, FASTING    HGA1C (HEMOGLOBIN A1C WITH EST AVG GLUCOSE)    URINALYSIS, MACROSCOPIC AND MICROSCOPIC W/CULTURE REFLEX    LIPID PANEL    MICROALBUMIN/CREATININE RATIO, URINE, RANDOM    Hyperlipidemia    Relevant Orders    CBC/DIFF    COMPREHENSIVE METABOLIC PNL, FASTING    HGA1C (HEMOGLOBIN A1C WITH EST AVG GLUCOSE)    URINALYSIS, MACROSCOPIC AND MICROSCOPIC W/CULTURE REFLEX    LIPID PANEL    MICROALBUMIN/CREATININE RATIO, URINE, RANDOM     Other Visit Diagnoses       Chronic back pain    -  Primary    Relevant Orders    CBC/DIFF    COMPREHENSIVE METABOLIC PNL, FASTING    HGA1C (HEMOGLOBIN A1C WITH EST AVG GLUCOSE)    URINALYSIS, MACROSCOPIC AND MICROSCOPIC W/CULTURE REFLEX    LIPID PANEL     MICROALBUMIN/CREATININE RATIO, URINE, RANDOM                Labs obtained in office  Prednisone 20 b.i.d. x5 days given for lower back pain  Will continue Tylenol No. 3 p.r.n. pain  B12 shot given  Meds refilled as noted   Continue blood pressure monitoring at home  Continue follow-up with Dr. Hessie Knows  as scheduled  Continue follow-up with neurologist/pulmonologist as scheduled  Encouraged to make  appointment with GI concerning colonoscopy with referral already made  Continue diet exercise weight management  Follow-up pending lab results otherwise as scheduled     Post-Discharge Follow Up Appointments       Wednesday Apr 27, 2023    Return Patient Visit with Eyvonne Mechanic, PA-C at 11:30 AM      Monday May 16, 2023    Appointment with Theda Belfast, MD at  9:00 AM      Wednesday May 25, 2023    Return Patient Visit with Eyvonne Mechanic, PA-C at 11:30 AM      Wednesday Jun 22, 2023    Return Patient Visit with Eyvonne Mechanic, PA-C at  1:30 PM      Wednesday Jul 20, 2023    Return Patient Visit with Eyvonne Mechanic, PA-C at  1:00 PM      Monday Nov 21, 2023    Return Telephone Visit with Eyvonne Mechanic, PA-C at  1:00 PM      Internal Medicine, Building A  [facility]  [address]  [place] [address]  [phone]            Seek medical attention for new or worsening symptoms.  Patient has been seen in this clinic within the last 3 years.     Khaden Gater, PA-C          This note was partially created using MModal Fluency Direct system (voice recognition software) and is inherently subject to errors including those of syntax and "sound-alike" substitutions which may  escape proofreading.  In such instances, original meaning may be extrapolated by contextual derivation.

## 2023-04-01 ENCOUNTER — Telehealth (INDEPENDENT_AMBULATORY_CARE_PROVIDER_SITE_OTHER): Payer: Self-pay | Admitting: Physician Assistant

## 2023-04-01 LAB — URINALYSIS, MACROSCOPIC
BILIRUBIN: NEGATIVE mg/dL
BLOOD: NEGATIVE mg/dL
GLUCOSE: NEGATIVE mg/dL
KETONES: NEGATIVE mg/dL
LEUKOCYTES: 500 WBCs/uL — AB
NITRITE: NEGATIVE
PH: 5.5 (ref 5.0–9.0)
PROTEIN: 50 mg/dL — AB
SPECIFIC GRAVITY: 1.022 (ref 1.002–1.030)
UROBILINOGEN: NORMAL mg/dL

## 2023-04-01 LAB — CBC WITH DIFF
BASOPHIL #: 0.1 10*3/uL (ref 0.00–0.10)
BASOPHIL %: 1 % (ref 0–1)
EOSINOPHIL #: 0.3 10*3/uL (ref 0.00–0.50)
EOSINOPHIL %: 3 %
HCT: 44 % — ABNORMAL HIGH (ref 31.2–41.9)
HGB: 14.8 g/dL — ABNORMAL HIGH (ref 10.9–14.3)
LYMPHOCYTE #: 2.5 10*3/uL (ref 1.00–3.00)
LYMPHOCYTE %: 26 % (ref 16–44)
MCH: 29.2 pg (ref 24.7–32.8)
MCHC: 33.6 g/dL (ref 32.3–35.6)
MCV: 86.7 fL (ref 75.5–95.3)
MONOCYTE #: 0.6 10*3/uL (ref 0.30–1.00)
MONOCYTE %: 6 % (ref 5–13)
MPV: 9.4 fL (ref 7.9–10.8)
NEUTROPHIL #: 6.2 10*3/uL (ref 1.85–7.80)
NEUTROPHIL %: 64 % (ref 43–77)
PLATELETS: 283 10*3/uL (ref 140–440)
RBC: 5.07 10*6/uL — ABNORMAL HIGH (ref 3.63–4.92)
RDW: 13.7 % (ref 12.3–17.7)
WBC: 9.7 10*3/uL (ref 3.8–11.8)

## 2023-04-01 LAB — COMPREHENSIVE METABOLIC PNL, FASTING
ALBUMIN/GLOBULIN RATIO: 1.9 — ABNORMAL HIGH (ref 0.8–1.4)
ALBUMIN: 4.6 g/dL (ref 3.5–5.7)
ALKALINE PHOSPHATASE: 57 U/L (ref 34–104)
ALT (SGPT): 20 U/L (ref 7–52)
ANION GAP: 6 mmol/L (ref 4–13)
AST (SGOT): 30 U/L (ref 13–39)
BILIRUBIN TOTAL: 0.6 mg/dL (ref 0.3–1.2)
BUN/CREA RATIO: 17 (ref 6–22)
BUN: 18 mg/dL (ref 7–25)
CALCIUM, CORRECTED: 9 mg/dL (ref 8.9–10.8)
CALCIUM: 9.5 mg/dL (ref 8.6–10.3)
CHLORIDE: 105 mmol/L (ref 98–107)
CO2 TOTAL: 26 mmol/L (ref 21–31)
CREATININE: 1.09 mg/dL (ref 0.60–1.30)
ESTIMATED GFR: 52 mL/min/{1.73_m2} — ABNORMAL LOW (ref >59)
GLOBULIN: 2.4 — ABNORMAL LOW (ref 2.9–5.4)
GLUCOSE: 104 mg/dL (ref 74–109)
OSMOLALITY, CALCULATED: 276 mOsm/kg (ref 270–290)
POTASSIUM: 4.2 mmol/L (ref 3.5–5.1)
PROTEIN TOTAL: 7 g/dL (ref 6.4–8.9)
SODIUM: 137 mmol/L (ref 136–145)

## 2023-04-01 LAB — HGA1C (HEMOGLOBIN A1C WITH EST AVG GLUCOSE): HEMOGLOBIN A1C: 5.8 % (ref 4.0–6.0)

## 2023-04-01 LAB — LIPID PANEL
CHOL/HDL RATIO: 3.3
CHOLESTEROL: 153 mg/dL (ref <200)
HDL CHOL: 46 mg/dL (ref >=40)
LDL CALC: 61 mg/dL (ref 0–100)
TRIGLYCERIDES: 229 mg/dL — ABNORMAL HIGH (ref <=150)
VLDL CALC: 46 mg/dL (ref 0–50)

## 2023-04-01 LAB — URINALYSIS, MICROSCOPIC

## 2023-04-01 NOTE — Telephone Encounter (Signed)
-----   Message from Eyvonne Mechanic, [place] sent at 04/01/2023 10:05 AM EDT -----  Urinary tract infection noted as patient having symptoms if currently not will wait on culture otherwise give her Macrobid 100 b.i.d. x7 days

## 2023-04-03 LAB — URINE CULTURE,ROUTINE: URINE CULTURE: 20000 — AB

## 2023-04-04 ENCOUNTER — Other Ambulatory Visit (INDEPENDENT_AMBULATORY_CARE_PROVIDER_SITE_OTHER): Payer: Self-pay | Admitting: Physician Assistant

## 2023-04-04 ENCOUNTER — Telehealth (INDEPENDENT_AMBULATORY_CARE_PROVIDER_SITE_OTHER): Payer: Self-pay | Admitting: Physician Assistant

## 2023-04-04 DIAGNOSIS — M545 Low back pain, unspecified: Secondary | ICD-10-CM

## 2023-04-08 ENCOUNTER — Telehealth (INDEPENDENT_AMBULATORY_CARE_PROVIDER_SITE_OTHER): Payer: Self-pay | Admitting: Physician Assistant

## 2023-04-08 MED ORDER — ICOSAPENT ETHYL 1 GRAM CAPSULE
2.0000 g | ORAL_CAPSULE | Freq: Two times a day (BID) | ORAL | 1 refills | Status: DC
Start: 2023-04-08 — End: 2023-08-01

## 2023-04-08 NOTE — Telephone Encounter (Signed)
-----   Message from Eyvonne Mechanic, [place] sent at 04/08/2023  9:11 AM EDT -----  Add lovaza 2 bid   Low fat diet

## 2023-04-15 ENCOUNTER — Telehealth (INDEPENDENT_AMBULATORY_CARE_PROVIDER_SITE_OTHER): Payer: Self-pay | Admitting: Physician Assistant

## 2023-04-15 MED ORDER — OMEGA-3 ACID ETHYL ESTERS 1 GRAM CAPSULE
2.0000 g | ORAL_CAPSULE | Freq: Two times a day (BID) | ORAL | 1 refills | Status: DC
Start: 2023-04-15 — End: 2023-08-01

## 2023-04-15 MED ORDER — NITROFURANTOIN MONOHYDRATE/MACROCRYSTALS 100 MG CAPSULE
100.0000 mg | ORAL_CAPSULE | Freq: Two times a day (BID) | ORAL | 0 refills | Status: AC
Start: 2023-04-15 — End: 2023-04-22

## 2023-04-15 NOTE — Telephone Encounter (Signed)
-----   Message from Eyvonne Mechanic, [place] sent at 04/03/2023  8:53 AM EDT -----  Gfr still low but btter  Chol levels better but trig still high  ?on for chol  Continue diet

## 2023-04-25 IMAGING — MR MRI LUMBAR SPINE WITHOUT CONTRAST
5 of 6 series · 33 of 48 positions shown · IV contrast (gadolinium)
Comparison: None available.

﻿EXAM:  60953   MRI LUMBAR SPINE WITHOUT CONTRAST
INDICATION: Chronic low back pain.  Bilateral hip and leg pain.
TECHNIQUE: Multiplanar multisequential MRI of the lumbar spine was performed without gadolinium contrast.

[Series 6: T2 · sagittal · 5.0mm · 0.94mm/px · 7 of 14 slices shown (1 of 3)]
[im 1/14]
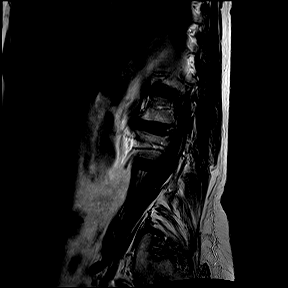
[im 3/14]
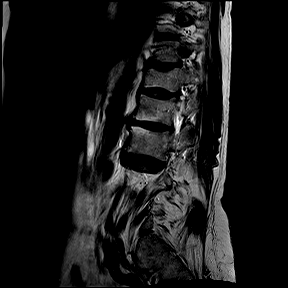
[im 5/14]
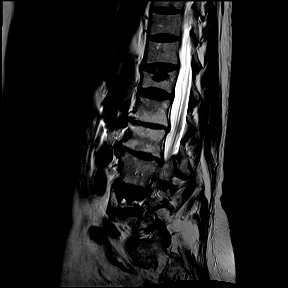
[im 7/14]
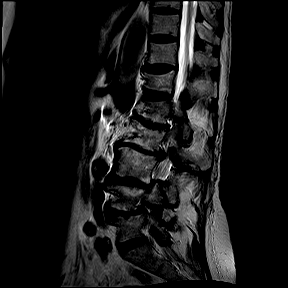
[im 9/14]
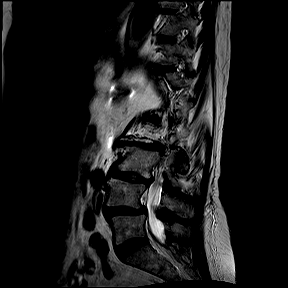
[im 11/14]
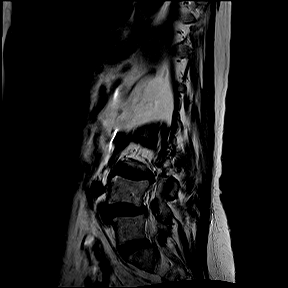
[im 14/14]
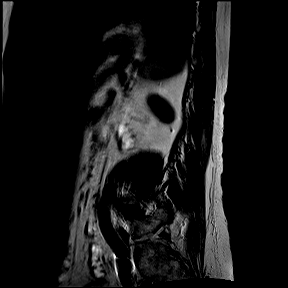

[Series 7: T1 · sagittal · 5.0mm · 0.94mm/px · 7 of 14 slices shown (1 of 2)]
[im 1/14]
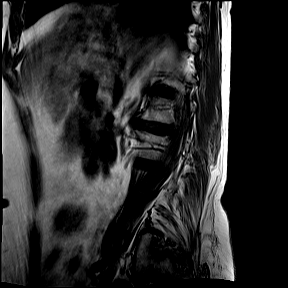
[im 3/14]
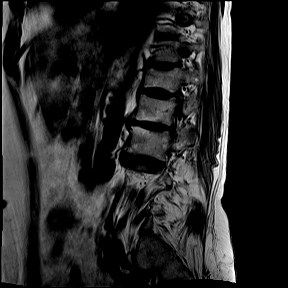
[im 5/14]
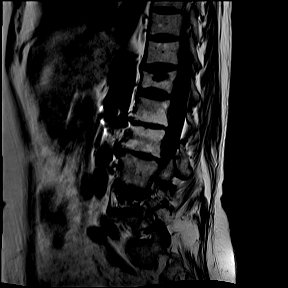
[im 7/14]
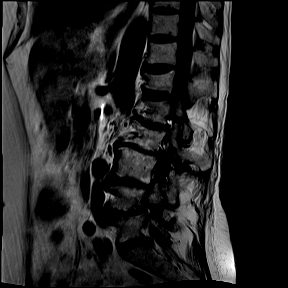
[im 9/14]
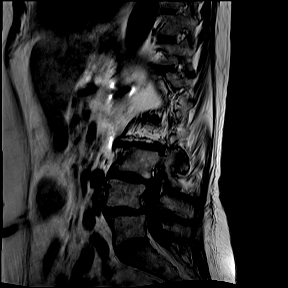
[im 11/14]
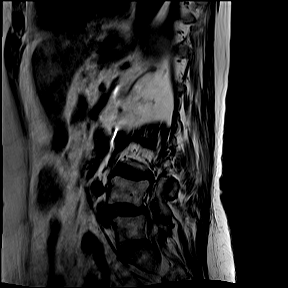
[im 14/14]
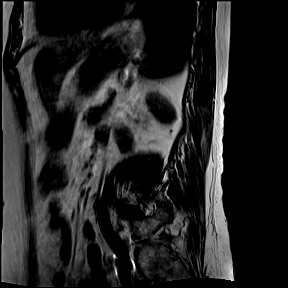

[Series 9: T2 · coronal · 5.0mm · 0.82mm/px · 9 of 18 slices shown (2 of 3)]
[im 1/18]
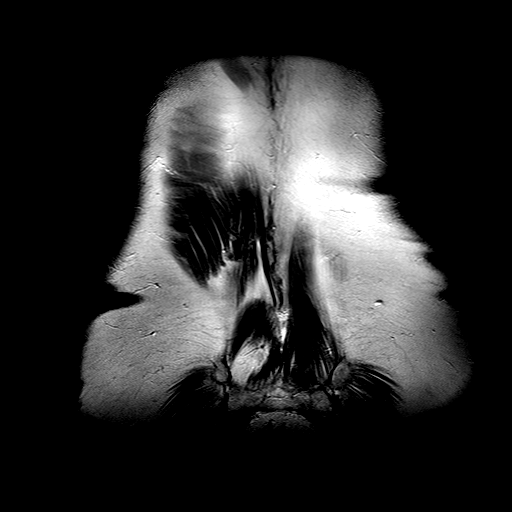
[im 3/18]
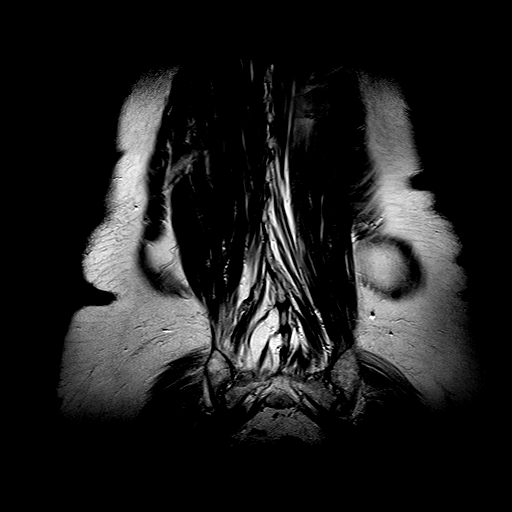
[im 5/18]
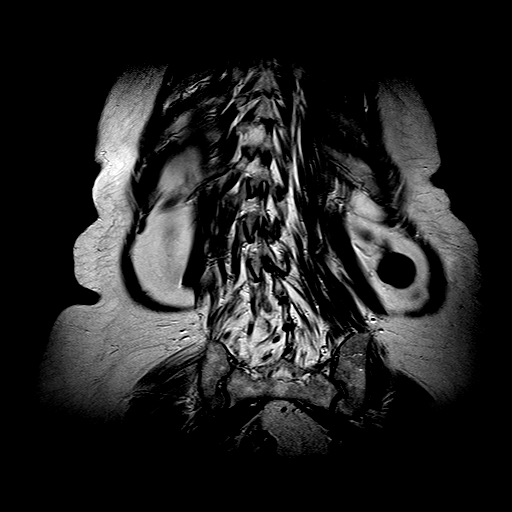
[im 7/18]
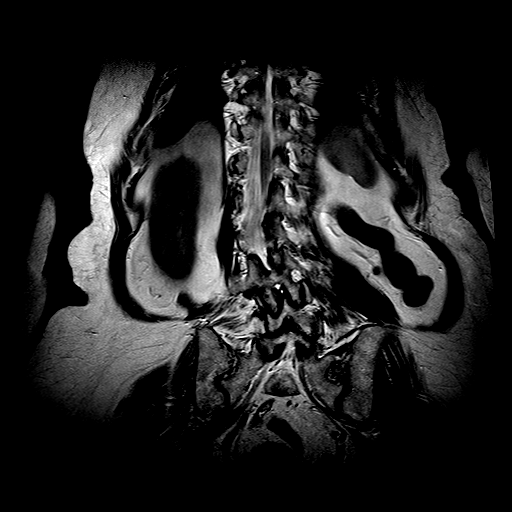
[im 9/18]
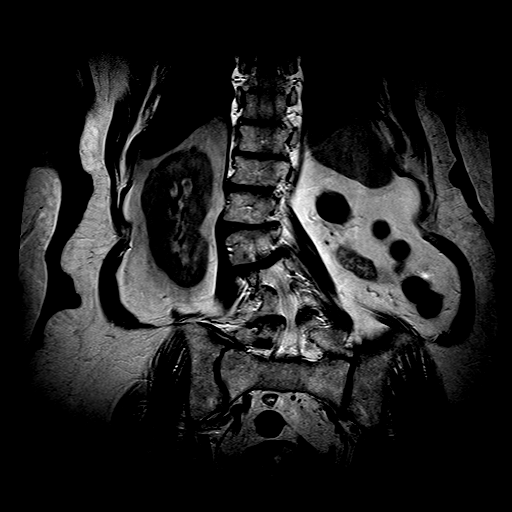
[im 11/18]
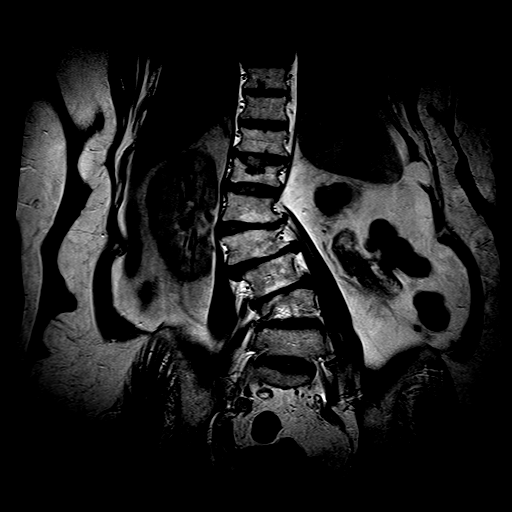
[im 13/18]
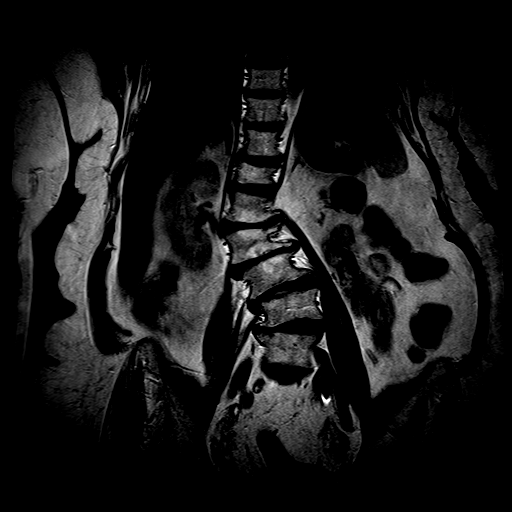
[im 15/18]
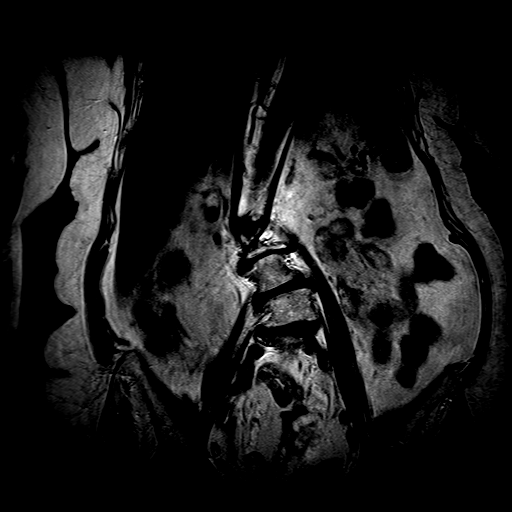
[im 18/18]
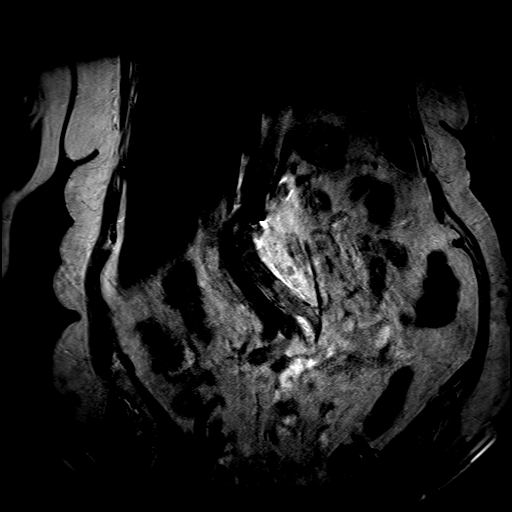

[Series 10: T2 · axial · 4.0mm · 0.52mm/px · z∈[-168,+34]mm · 9 of 17 slices shown (3 of 3)]
[im 1/17]
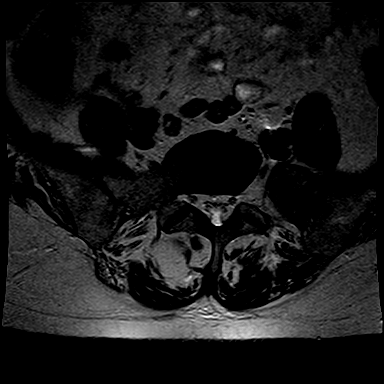
[im 3/17]
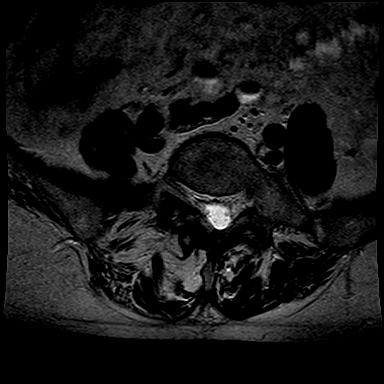
[im 5/17]
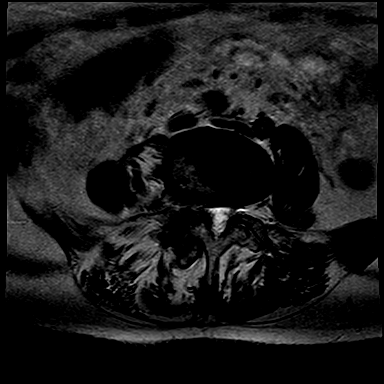
[im 7/17]
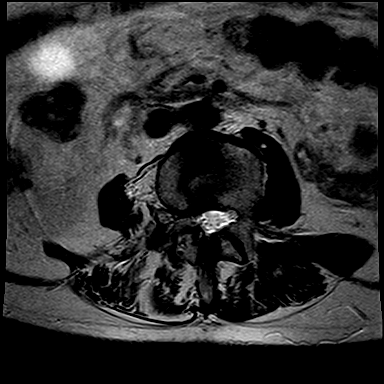
[im 9/17]
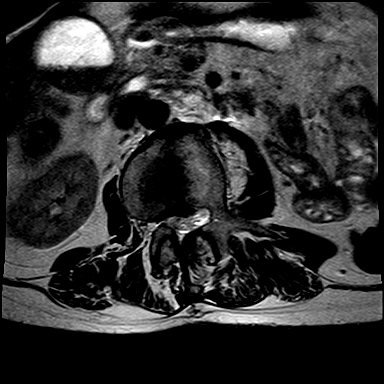
[im 11/17]
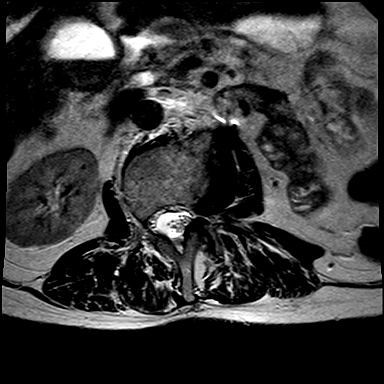
[im 13/17]
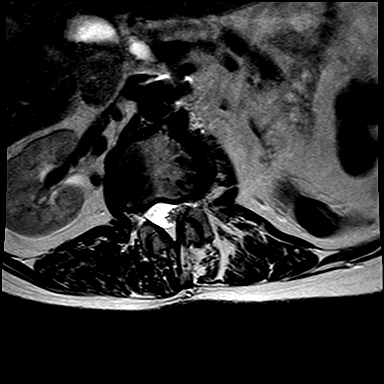
[im 15/17]
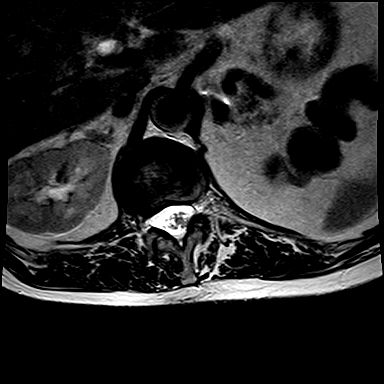
[im 17/17]
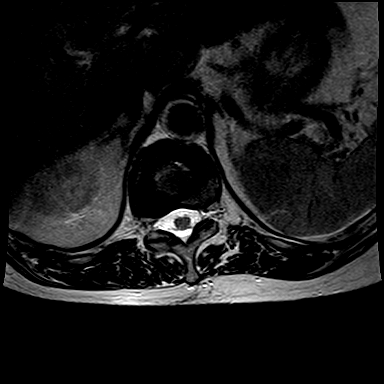

[Series 11: T1 · axial · 4.0mm · 0.52mm/px · 1 of 17 slices shown (2 of 2)]
[im 1/17]
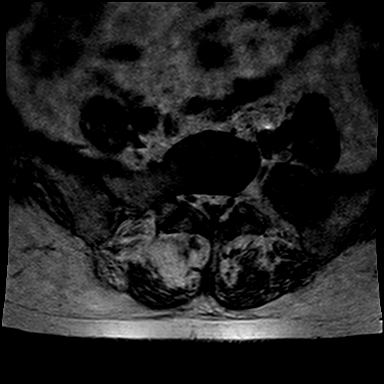

[33 of 48 positions shown; findings below may reference images not displayed]

FINDINGS: There is moderate scoliosis.  

There is a mild T12 compression fracture that appears old.  No other fracture is seen.  

There are moderately severe degenerative changes at multiple levels with disc space narrowing, osteophyte formation, and degenerative marrow signal alteration.  The conus appears unremarkable.  

There is mild to moderate multilevel disc bulging.  No disc herniation is seen.  There is mild multilevel spinal stenosis.
IMPRESSION: 1. Moderate scoliosis.  

2. Old T12 compression fracture. 

3. Moderately severe degenerative changes.  

4. Mild multilevel spinal stenosis.

## 2023-04-26 ENCOUNTER — Other Ambulatory Visit (INDEPENDENT_AMBULATORY_CARE_PROVIDER_SITE_OTHER): Payer: Self-pay | Admitting: Physician Assistant

## 2023-04-26 ENCOUNTER — Ambulatory Visit: Payer: Medicare Other | Attending: Physician Assistant | Admitting: Physician Assistant

## 2023-04-26 ENCOUNTER — Encounter (INDEPENDENT_AMBULATORY_CARE_PROVIDER_SITE_OTHER): Payer: Self-pay | Admitting: Physician Assistant

## 2023-04-26 ENCOUNTER — Other Ambulatory Visit: Payer: Self-pay

## 2023-04-26 VITALS — BP 124/82 | HR 52 | Temp 98.5°F | Ht 60.0 in | Wt 157.0 lb

## 2023-04-26 DIAGNOSIS — F32A Depression, unspecified: Secondary | ICD-10-CM

## 2023-04-26 DIAGNOSIS — R5382 Chronic fatigue, unspecified: Secondary | ICD-10-CM | POA: Insufficient documentation

## 2023-04-26 DIAGNOSIS — N289 Disorder of kidney and ureter, unspecified: Secondary | ICD-10-CM | POA: Insufficient documentation

## 2023-04-26 DIAGNOSIS — I10 Essential (primary) hypertension: Secondary | ICD-10-CM

## 2023-04-26 DIAGNOSIS — R053 Chronic cough: Secondary | ICD-10-CM

## 2023-04-26 DIAGNOSIS — F419 Anxiety disorder, unspecified: Secondary | ICD-10-CM | POA: Insufficient documentation

## 2023-04-26 DIAGNOSIS — M549 Dorsalgia, unspecified: Secondary | ICD-10-CM

## 2023-04-26 DIAGNOSIS — N390 Urinary tract infection, site not specified: Secondary | ICD-10-CM

## 2023-04-26 DIAGNOSIS — G8929 Other chronic pain: Secondary | ICD-10-CM | POA: Insufficient documentation

## 2023-04-26 LAB — URINALYSIS, MACROSCOPIC
BILIRUBIN: NEGATIVE mg/dL
BLOOD: NEGATIVE mg/dL
GLUCOSE: NEGATIVE mg/dL
KETONES: NEGATIVE mg/dL
LEUKOCYTES: NEGATIVE WBCs/uL
NITRITE: NEGATIVE
PH: 5.5 (ref 5.0–9.0)
PROTEIN: 30 mg/dL — AB
SPECIFIC GRAVITY: 1.029 (ref 1.002–1.030)
UROBILINOGEN: 2 mg/dL — AB

## 2023-04-26 LAB — URINALYSIS, MICROSCOPIC
RBCS: 6 /hpf — ABNORMAL HIGH (ref <4)
SQUAMOUS EPITHELIAL: 8 /hpf (ref <28)
WBCS: 6 /hpf — ABNORMAL HIGH (ref <6)

## 2023-04-26 MED ORDER — NEBIVOLOL 10 MG TABLET
10.0000 mg | ORAL_TABLET | Freq: Every day | ORAL | 1 refills | Status: DC
Start: 2023-04-26 — End: 2023-05-30

## 2023-04-26 MED ORDER — CEPHALEXIN 500 MG CAPSULE
500.0000 mg | ORAL_CAPSULE | Freq: Two times a day (BID) | ORAL | 0 refills | Status: AC
Start: 2023-04-26 — End: 2023-05-03

## 2023-04-26 MED ORDER — NITROFURANTOIN MACROCRYSTAL 50 MG CAPSULE
50.0000 mg | ORAL_CAPSULE | Freq: Every evening | ORAL | 3 refills | Status: DC
Start: 2023-04-26 — End: 2023-08-01

## 2023-04-26 MED ORDER — D-MANNOSE 500 MG CAPSULE
1.0000 | ORAL_CAPSULE | Freq: Two times a day (BID) | ORAL | 1 refills | Status: DC
Start: 2023-04-26 — End: 2023-12-21

## 2023-04-26 MED ORDER — ACETAMINOPHEN 300 MG-CODEINE 30 MG TABLET
1.0000 | ORAL_TABLET | Freq: Three times a day (TID) | ORAL | 0 refills | Status: DC
Start: 2023-04-26 — End: 2023-05-26

## 2023-04-26 MED ORDER — PRAVASTATIN 80 MG TABLET
80.0000 mg | ORAL_TABLET | Freq: Every day | ORAL | 1 refills | Status: DC
Start: 2023-04-26 — End: 2023-08-01

## 2023-04-26 MED ORDER — LORAZEPAM 2 MG TABLET
2.0000 mg | ORAL_TABLET | Freq: Every evening | ORAL | 2 refills | Status: DC | PRN
Start: 2023-04-26 — End: 2023-05-30

## 2023-04-26 MED ORDER — CYANOCOBALAMIN (VIT B-12) 1,000 MCG/ML INJECTION SOLUTION
1000.0000 ug | INTRAMUSCULAR | Status: AC
Start: 2023-04-26 — End: 2023-04-26
  Administered 2023-04-26: 1000 ug via SUBCUTANEOUS

## 2023-04-26 MED ORDER — PROMETHAZINE 6.25 MG-CODEINE 10 MG/5 ML SYRUP
5.0000 mL | ORAL_SOLUTION | Freq: Four times a day (QID) | ORAL | 0 refills | Status: DC | PRN
Start: 2023-04-26 — End: 2023-05-04

## 2023-04-26 MED ORDER — PHENAZOPYRIDINE 100 MG TABLET
100.0000 mg | ORAL_TABLET | Freq: Three times a day (TID) | ORAL | 0 refills | Status: AC
Start: 2023-04-26 — End: 2023-04-29

## 2023-04-26 NOTE — Nursing Note (Signed)
04/26/23 1400   Urine test  (S[employer] Multistix 10 SG)   Performed Status: Automated   Time collected 1405   Color (Ref Range: Yellow) (!) Dark Yellow   Clarity (Ref Range: Clear) (!) Cloudy   Glucose (Ref Range: Negative mg/dL) (!) Trace (100mg /dl)   Bilirubin (Ref Range: Negative mg/dL) (!) 2+   Ketones (Ref Range: Negative mg/dL) Negative   Urine Specific Gravity (Ref Range: 1.005 - 1.030) > or equal to 1.030   Blood (urine) (Ref Range: Negative mg/dL) Negative   pH (Ref Range: 5.0 - 8.0) 5.5   Protein (Ref Range: Negative mg/dL) (!) 1+ (30mg /dL)   Urobilinogen (Ref Range: Normal) 1.0 mg/dL (Normal)   Nitrite (Ref Range: Negative) (!) Positive   Leukocytes (Ref Range: Negative WBC's/uL) Negative   Initials KD,CMA

## 2023-04-26 NOTE — Progress Notes (Signed)
INTERNAL MEDICINE, [facility]  [address]  [place] [address]  Operated by [facility]  History and Physical     Name: Christine Mcmillan MRN:  [id number]   Date: 04/26/2023 Age: 77 y.o.               Follow Up        Reason for Visit: Follow Up (Routine 1 month ) and Urinary Tract Infection (Complaints )      History of Present Illness  Christine Mcmillan is a 77 y.o. female who is being seen today in office for follow-up however has complaints of burning w urination pressure and low grade temp x 2-3 days. Hx of recent UTI for which she just took macrobid. Denies any abd pain n/v.      F/U BP/HTN w reading stable at 124/82.  Pt is currently taking Norvasc 5mg  po daily along w bystolic 10 mg daily.  Blood pressure has been stable when checked at home as well.  Denies chest pain headaches dizziness. She does have hx of some renal insuff w last GFR 52. Referral to nephrologist has been made however pt did not f/u as scheduled and appt rescheduled.    Follow-up chronic anxiety/insomnia for which patient uses Ativan 2 mg nightly.  Patient has went back and forth from several medications including Xanax Valium trazodone and still has issues sleeping as well as anxiety specifically in the evenings.  She is used Lexapro as well as Effexor in the past for her depression but stopped medication after stating she did not feel like it was helping.  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  She does tell me the neurologist told her to be very careful about the Ativan making her unsteady and falling which she denies ever having an issue with.  Sometime back did refer her to the pulmonologist have sleep studies but she did not comply with appointment.    Follow-up chronic cough for which she has had for numerous years and was referred to the pulmonologist who she states she has an appointment with next month.  She is prescription for promethazine  DM she uses for the chronic cough and requests refills today.  She has had pneumonia in the past but currently no chest congestion shortness a breath wheezing noted.    F/u chronic back pain w recent MRI of lumbar spine showing moderate scoliosis, old T12 compression fx, moderate DJD and mild spinal stenosis. Pt has chronic back pain and uses Tylenol #3  prn. A referral has also been made to neurosurgeon for eval.    Follow-up +chronic fatigue issues noted w pt having B12 inject in the past which helps and would like to get another today .      Referred to Dr Allena Katz also concerning her GERD issues as well as colonoscopy but pt did not go to appt and wants to wait at this time        PHQ Questionnaire         Past Medical History:   Diagnosis Date    Abnormal renal function     Anxiety     Arthralgia     Bell's palsy     Chronic hypokalemia     COVID-19 vaccine series completed     Depression     Esophageal reflux     History of memory loss     History of recurrent UTIs  Hypertension          Past Surgical History:   Procedure Laterality Date    BRAIN TUMOR EXCISION      HX LIPOMA RESECTION N/A     Lipoma on back      Family Medical History:       Problem Relation (Age of Onset)    Cancer Mother            Social History     Tobacco Use    Smoking status: Never    Smokeless tobacco: Never   Substance Use Topics    Alcohol use: Never    Drug use: Never     Medication:  amLODIPine (NORVASC) 5 mg Oral Tablet, Take 1 Tablet (5 mg total) by mouth Once a day  cyclobenzaprine (FLEXERIL) 10 mg Oral Tablet, Take 1 Tablet (10 mg total) by mouth Three times a day as needed for Muscle spasms  diclofenac sodium (PENNSAID) 20 mg/gram /actuation(2 %) solution in metered-dose pump, Apply 1 Application  topically Twice daily  ergocalciferol, vitamin D2, (DRISDOL) 1,250 mcg (50,000 unit) Oral Capsule, Take 1 Capsule (50,000 Units total) by mouth Every 7 days  ezetimibe (ZETIA) 10 mg Oral Tablet, Take 1 Tablet (10 mg total) by mouth  Every evening  furosemide (LASIX) 40 mg Oral Tablet, Take 1 Tablet (40 mg total) by mouth Once per day as needed for Other (Edema)  icosapent ethyL (VASCEPA) 1 gram Oral Capsule, Take 2 Capsules (2 g total) by mouth Twice daily with food  NALOXONE 4 MG/ACTUATION NASAL SPRAY, 1 Spray by INTRANASAL route Every 2 minutes as needed for actual or suspected opioid overdose. Call 911 if used.  omega-3 fatty acid (LOVAZA) 1 gram Oral Capsule, Take 2 Capsules (2 g total) by mouth Twice daily  omeprazole (PRILOSEC) 20 mg Oral Capsule, Delayed Release(E.C.), Take 1 Capsule (20 mg total) by mouth Once a day  rizatriptan (MAXALT-MLT) 5 mg Oral Tablet, Rapid Dissolve, Take 1 Tablet (5 mg total) by mouth Once, as needed May repeat in 2 hours if needed.  traZODone (DESYREL) 50 mg Oral Tablet, Take 1 Tablet (50 mg total) by mouth Every night  acetaminophen-codeine (TYLENOL #3) 300-30 mg Oral Tablet, Take 1 Tablet by mouth Three times a day for 30 days  LORazepam (ATIVAN) 2 mg Oral Tablet, Take 1 Tablet (2 mg total) by mouth Every night as needed for Anxiety  nebivoloL (BYSTOLIC) 10 mg Oral Tablet, Take 1 Tablet (10 mg total) by mouth Once a day  pravastatin (PRAVACHOL) 80 mg Oral Tablet,   promethazine-dextromethorphan (PHENERGAN-DM) 6.25-15 mg/5 mL Oral Syrup, Take 5 mL by mouth Four times a day as needed for Cough for up to 7 days    No facility-administered medications prior to visit.    Allergies:  Allergies   Allergen Reactions    Acetic Acid-Aluminum Acetate Swelling    Sertraline  Other Adverse Reaction (Add comment)     Headache         Physical Exam:  Vitals:    04/26/23 1352   BP: 124/82   Pulse: 52   Temp: 36.9 C (98.5 F)   SpO2: 98%   Weight: 71.2 kg (157 lb)   Height: 1.524 m (5')   BMI: 30.73      Physical Exam  Vitals and nursing note reviewed.   HENT:      Head: Atraumatic.      Right Ear: Tympanic membrane normal.      Left Ear: Tympanic membrane normal.  Mouth/Throat:      Mouth: Mucous membranes are moist.       Pharynx: Oropharynx is clear.      Comments: +chronic mild RT sided facial weakness due to previous bells however overall improved  Eyes:      Extraocular Movements: Extraocular movements intact.      Comments: +mild ptosis RT eye   Cardiovascular:      Rate and Rhythm: Normal rate and regular rhythm.      Pulses: Normal pulses.   Pulmonary:      Effort: Pulmonary effort is normal.      Breath sounds: Normal breath sounds. No wheezing or rhonchi.   Abdominal:      General: Bowel sounds are normal.      Palpations: Abdomen is soft.      Tenderness: There is no abdominal tenderness. There is no right CVA tenderness or left CVA tenderness.   Musculoskeletal:         General: No swelling. Normal range of motion.      Cervical back: Normal range of motion and neck supple.      Comments: +DJD bilat hands/knees  Left knee > RT w crepitus noted and mild genu varum   +decreased muscle tone all extrem  Decreased range of motion/flexion lower back due to pain  Positive straight leg raise on left at 80  Gait stable    Bunion noted right foot with significant curvature   Skin:     General: Skin is warm.      Findings: No lesion or rash.      Comments: +scattered purpura bilat upper extrem   Neurological:      General: No focal deficit present.      Mental Status: She is alert and oriented to person, place, and time.      Cranial Nerves: No cranial nerve deficit.      Sensory: No sensory deficit.      Motor: No weakness.      Gait: Gait normal.   Psychiatric:         Mood and Affect: Mood normal.         Behavior: Behavior normal.         Thought Content: Thought content normal.         Judgment: Judgment normal.        Urine Dip Results:   Time collected: 1405  Glucose (Ref Range: Negative mg/dL): (!) Trace (100mg /dl)  Bilirubin (Ref Range: Negative mg/dL): (!) Moderate  Ketones (Ref Range: Negative mg/dL): Negative  Urine Specific Gravity (Ref Range: 1.005 - 1.030): > or equal to 1.030  Blood (urine) (Ref Range: Negative  mg/dL): Negative  pH (Ref Range: 5.0 - 8.0): 5.5  Protein (Ref Range: Negative mg/dL): (!) 1+ (30mg /dL)  Urobilinogen (Ref Range: Normal): 1.0 mg/dL (Normal)  Nitrite (Ref Range: Negative): (!) Positive  Leukocytes (Ref Range: Negative WBC's/uL): Negative       Assessment/Plan:  Problem List Items Addressed This Visit       Essential hypertension    Anxiety and depression    Chronic cough    Abnormal renal function    Chronic fatigue     Other Visit Diagnoses       Recurrent UTI (urinary tract infection)    -  Primary    Relevant Orders    URINALYSIS, MACROSCOPIC AND MICROSCOPIC W/CULTURE REFLEX (Completed)    POCT URINE DIPSTICK    Referral to UROLOGY - [place] - [facility]  URINE CULTURE,ROUTINE (Completed)    Chronic back pain                Urine sent for culture  Keflex 500 mg bid x 7 days  Dmannose 500 mg bid   Referred to urologist  B12 shot given  Meds refilled as noted   Continue blood pressure monitoring at home  Continue follow-up with neurosurgeon/ neurologist/pulmonologist as scheduled  Encouraged to make  appointment with GI concerning colonoscopy with referral already made  Continue diet exercise weight management    Post-Discharge Follow Up Appointments       Monday May 30, 2023    Return Patient Visit with Eyvonne Mechanic, PA-C at  2:30 PM      Tuesday Jun 28, 2023    Return Patient Visit with Eyvonne Mechanic, PA-C at  1:30 PM      Wednesday Jul 27, 2023    Return Patient Visit with Eyvonne Mechanic, PA-C at  1:00 PM      Monday Nov 21, 2023    Return Telephone Visit with Eyvonne Mechanic, PA-C at  1:00 PM      Internal Medicine, Building A  [facility]  [address]  [place] [address]  [phone]               Seek medical attention for new or worsening symptoms.  Patient has been seen in this clinic within the last 3 years.     Aubreyana Saltz, PA-C          This note was partially created using MModal Fluency Direct system (voice recognition software) and is inherently subject to errors including  those of syntax and "sound-alike" substitutions which may escape proofreading.  In such instances, original meaning may be extrapolated by contextual derivation.

## 2023-04-26 NOTE — Nursing Note (Signed)
Patient is here for routine 1 month follow up for medication refills. Patient also has complaints of UTI symptoms.

## 2023-04-27 ENCOUNTER — Ambulatory Visit: Payer: Medicare Other | Admitting: Physician Assistant

## 2023-04-28 LAB — URINE CULTURE,ROUTINE: URINE CULTURE: 10000 — AB

## 2023-05-02 ENCOUNTER — Other Ambulatory Visit (INDEPENDENT_AMBULATORY_CARE_PROVIDER_SITE_OTHER): Payer: Self-pay

## 2023-05-02 DIAGNOSIS — M545 Low back pain, unspecified: Secondary | ICD-10-CM

## 2023-05-03 ENCOUNTER — Telehealth (INDEPENDENT_AMBULATORY_CARE_PROVIDER_SITE_OTHER): Payer: Self-pay | Admitting: Physician Assistant

## 2023-05-03 DIAGNOSIS — M419 Scoliosis, unspecified: Secondary | ICD-10-CM

## 2023-05-03 DIAGNOSIS — M4800 Spinal stenosis, site unspecified: Secondary | ICD-10-CM

## 2023-05-03 NOTE — Telephone Encounter (Signed)
-----   Message from Eyvonne Mechanic, [place] sent at 05/02/2023  4:57 PM EDT -----  Tell patient she has moderate scoliosis an old T12 fracture and spinal stenosis referred to neurosurgeon

## 2023-05-04 ENCOUNTER — Other Ambulatory Visit: Payer: Self-pay

## 2023-05-04 ENCOUNTER — Ambulatory Visit: Payer: Medicare Other | Attending: Physician Assistant | Admitting: Physician Assistant

## 2023-05-04 ENCOUNTER — Encounter (INDEPENDENT_AMBULATORY_CARE_PROVIDER_SITE_OTHER): Payer: Self-pay | Admitting: Physician Assistant

## 2023-05-04 DIAGNOSIS — N390 Urinary tract infection, site not specified: Secondary | ICD-10-CM

## 2023-05-04 DIAGNOSIS — J029 Acute pharyngitis, unspecified: Secondary | ICD-10-CM

## 2023-05-04 DIAGNOSIS — R053 Chronic cough: Secondary | ICD-10-CM

## 2023-05-04 MED ORDER — PROMETHAZINE 6.25 MG-CODEINE 10 MG/5 ML SYRUP
5.0000 mL | ORAL_SOLUTION | Freq: Four times a day (QID) | ORAL | 0 refills | Status: AC | PRN
Start: 2023-05-04 — End: 2023-05-14

## 2023-05-04 MED ORDER — AMOXICILLIN 500 MG CAPSULE
500.0000 mg | ORAL_CAPSULE | Freq: Three times a day (TID) | ORAL | 0 refills | Status: AC
Start: 2023-05-04 — End: 2023-05-14

## 2023-05-04 MED ORDER — PROMETHAZINE-DM 6.25 MG-15 MG/5 ML ORAL SYRUP
5.0000 mL | ORAL_SOLUTION | Freq: Four times a day (QID) | ORAL | 0 refills | Status: DC | PRN
Start: 2023-05-04 — End: 2023-05-04

## 2023-05-04 NOTE — Progress Notes (Signed)
INTERNAL MEDICINE, [name]  [address]  [place] [address]        Telephone Visit    Name:  Christine Mcmillan MRN: [id number]   Date:  05/04/2023 Age:   77 y.o.     The patient/family initiated a request for telephone service.  Verbal consent for this service was obtained from the patient/family.    TELEMEDICINE DOCUMENTATION:    [facility]    Patient/family aware of provider location:  yes  Patient/family consent for telemedicine:  yes  Examination observed and performed by:  Eyvonne Mechanic, PA-C       Chief Complaint   Patient presents with    Sore Throat     cough        Call notes:  Christine Mcmillan is a 77 y.o. female who states she woke up this am with a sore throat. She notes some mild sinus cong w increased cough however no sob fever chills. Denies body aches. Pt had just finished keflex for UTI and states urine sx now resolved. She will restart her macrodantin 50 mg q hs. Requesting bigger bottle of her her cough med as well today.      Current meds:     acetaminophen-codeine (TYLENOL #3) 300-30 mg Oral Tablet Take 1 Tablet by mouth Three times a day for 30 days    amLODIPine (NORVASC) 5 mg Oral Tablet Take 1 Tablet (5 mg total) by mouth Once a day    amoxicillin (AMOXIL) 500 mg Oral Capsule Take 1 Capsule (500 mg total) by mouth Three times a day for 10 days    cyclobenzaprine (FLEXERIL) 10 mg Oral Tablet Take 1 Tablet (10 mg total) by mouth Three times a day as needed for Muscle spasms    d-mannose 500 mg Oral Capsule Take 1 Capsule (500 mg total) by mouth Twice daily    diclofenac sodium (PENNSAID) 20 mg/gram /actuation(2 %) solution in metered-dose pump Apply 1 Application  topically Twice daily    ergocalciferol, vitamin D2, (DRISDOL) 1,250 mcg (50,000 unit) Oral Capsule Take 1 Capsule (50,000 Units total) by mouth Every 7 days    ezetimibe (ZETIA) 10 mg Oral Tablet Take 1 Tablet (10 mg total) by mouth Every evening    furosemide (LASIX) 40 mg Oral Tablet Take 1 Tablet  (40 mg total) by mouth Once per day as needed for Other (Edema)    icosapent ethyL (VASCEPA) 1 gram Oral Capsule Take 2 Capsules (2 g total) by mouth Twice daily with food    LORazepam (ATIVAN) 2 mg Oral Tablet Take 1 Tablet (2 mg total) by mouth Every night as needed for Anxiety    NALOXONE 4 MG/ACTUATION NASAL SPRAY 1 Spray by INTRANASAL route Every 2 minutes as needed for actual or suspected opioid overdose. Call 911 if used.    nebivoloL (BYSTOLIC) 10 mg Oral Tablet Take 1 Tablet (10 mg total) by mouth Once a day    nitrofurantoin macrocrystaL (MACRODANTIN) 50 mg Oral Capsule Take 1 Capsule (50 mg total) by mouth Every night    omega-3 fatty acid (LOVAZA) 1 gram Oral Capsule Take 2 Capsules (2 g total) by mouth Twice daily    omeprazole (PRILOSEC) 20 mg Oral Capsule, Delayed Release(E.C.) Take 1 Capsule (20 mg total) by mouth Once a day    pravastatin (PRAVACHOL) 80 mg Oral Tablet Take 1 Tablet (80 mg total) by mouth Once a day    promethazine-codeine (PHENERGAN WITH CODEINE) 6.25-10 mg/5 mL Oral Syrup  Take 5 mL by mouth Four times a day as needed for Cough for up to 10 days    rizatriptan (MAXALT-MLT) 5 mg Oral Tablet, Rapid Dissolve Take 1 Tablet (5 mg total) by mouth Once, as needed May repeat in 2 hours if needed.    traZODone (DESYREL) 50 mg Oral Tablet Take 1 Tablet (50 mg total) by mouth Every night        Allergies   Allergen Reactions    Acetic Acid-Aluminum Acetate Swelling    Sertraline  Other Adverse Reaction (Add comment)     Headache          Past Medical History:   Diagnosis Date    Abnormal renal function     Anxiety     Arthralgia     Bell's palsy     Chronic hypokalemia     COVID-19 vaccine series completed     Depression     Esophageal reflux     History of memory loss     History of recurrent UTIs     Hypertension         Social History     Socioeconomic History    Marital status: Divorced   Tobacco Use    Smoking status: Never    Smokeless tobacco: Never   Substance and Sexual Activity     Alcohol use: Never    Drug use: Never     Social Determinants of Health     Financial Resource Strain: Low Risk  (07/22/2022)    Financial Resource Strain     SDOH Financial: No   Transportation Needs: Low Risk  (07/22/2022)    Transportation Needs     SDOH Transportation: No   Social Connections: Low Risk  (07/22/2022)    Social Connections     SDOH Social Isolation: 5 or more times a week   Intimate Partner Violence: Low Risk  (07/22/2022)    Intimate Partner Violence     SDOH Domestic Violence: No   Housing Stability: Low Risk  (07/22/2022)    Housing Stability     SDOH Housing Situation: I have housing.     SDOH Housing Worry: No        Past Surgical History:   Procedure Laterality Date    BRAIN TUMOR EXCISION      HX LIPOMA RESECTION N/A     Lipoma on back        Family Medical History:       Problem Relation (Age of Onset)    Cancer Mother             Physical Exam  Constitutional:       Comments: Patient able to relay history today without issue   Pulmonary:      Effort: Pulmonary effort is normal.   Neurological:      Mental Status: She is alert.   Psychiatric:         Mood and Affect: Mood normal.      Assessment & plan:      ICD-10-CM    1. Sore throat  J02.9       2. Chronic cough  R05.3       3. Recurrent UTI (urinary tract infection)  N39.0           Plan    Amoxil 500 mg tid x 10 days  Will call pharm to see if prometh w codeine in stock and bottle size avail  Rest fluids tylenol prn  F/u  no better 4-5 days    Total provider time spent with the patient on the phone: 14 minutes.    Post-Discharge Follow Up Appointments       Monday May 16, 2023    Appointment with Theda Belfast, MD at  9:00 AM      Monday May 30, 2023    Return Patient Visit with Eyvonne Mechanic, PA-C at  2:30 PM      Tuesday Jun 28, 2023    Return Patient Visit with Eyvonne Mechanic, PA-C at  1:30 PM      Wednesday Jul 27, 2023    Return Patient Visit with Eyvonne Mechanic, PA-C at  1:00 PM      Monday Nov 21, 2023    Return Telephone Visit with Eyvonne Mechanic, PA-C at  1:00 PM      Internal Medicine, Building A  [facility]  [address]  [place] [address]  [phone]             This note was partially created using MModal Fluency Direct system (voice recognition software) and is inherently subject to errors including those of syntax and "sound-alike" substitutions which may escape proofreading.  In such instances, original meaning may be extrapolated by contextual derivation.    Jamison Soward, PA-C

## 2023-05-04 NOTE — Addendum Note (Signed)
Addended byEyvonne Mechanic on: 05/04/2023 02:58 PM     Modules accepted: Orders

## 2023-05-13 ENCOUNTER — Telehealth (INDEPENDENT_AMBULATORY_CARE_PROVIDER_SITE_OTHER): Payer: Self-pay | Admitting: Physician Assistant

## 2023-05-13 MED ORDER — PREDNISONE 20 MG TABLET
20.0000 mg | ORAL_TABLET | Freq: Two times a day (BID) | ORAL | 0 refills | Status: AC
Start: 2023-05-13 — End: 2023-05-18

## 2023-05-13 NOTE — Telephone Encounter (Signed)
Pt called and stated she fell.. said nothing was broken but she is "uncomfortable".. pt stated she would like some prednisone because she believes it will help

## 2023-05-20 NOTE — ED Provider Notes (Signed)
ED Provider Note    Subjective     HPI  Christine Mcmillan is a 77 year old female amatory to the ED with family member with complaint of left knee pain the patient states that she fell last week on her knees but states that the left knee pain has never gotten better has gotten worse.  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  She denies any other injuries    This patient  is deemed to be low risk for COVID-19.  The patient does not at this time require isolation, treatment or testing including workup in PPE beyond hospital wide policy of universal precautions.    Objective     Vitals <redacted file path>:  ED Triage Vitals [05/20/23 2023]   BP Pulse Resp Temp SpO2 Flow (L/min) (Oxygen Therapy)   (!) 143/76 83 20 98.4 F (36.9 C) 98 % --     Physical Exam      Nursing notes and vital signs reviewed.    Physical Exam  Constitutional:       Appearance: Normal appearance.   Cardiovascular:      Rate and Rhythm: Normal rate and regular rhythm.      Heart sounds: Normal heart sounds.   Pulmonary:      Breath sounds: Normal breath sounds.   Abdominal:     Non tender, non distended  Musculoskeletal:     There is left knee prepatellar tenderness palpation noted with limited range of motion flexion extension of left knee due to pain with crepitus noted.  Vascular neuro is intact.  There is no cyanosis or edema.  Skin:     General: Skin is warm and dry.   Neurological:      General: No focal deficit present.      Mental Status: He is alert and oriented to person, place, and time.                        ---MDM, Assessment, and Plan---       INITIAL PLAN:   Physical examination, indicated diagnostic studies and treatments ..and review old records with disposition and treatment to be determined by results.          DATA REVIEW/RADIOLOGIC EXAMINATIONS:  No results found for this visit on 05/20/23.      ED COURSE, DIFFERENTIAL DIAGNOSIS AND  MEDICAL DECISION MAKING:  8:25 PM: Patient seen examined, left knee x-ray ordered.  Patient declined analgesics    Transfer of Care  Case was discussed with Dr. Milus Glazier who will be taking over the care of the patient in the Emergency Department pending laboratory studies and imaging and treatment response.  Discussion of case included pertinent findings within the history, physical exam, lab, abd radiological findings, along with current assessments and recommendations.  Pertinent medications taken prior to arrival and within the Emergency Department were also reviewed with the accepting practitioner.      At time I assumed care of the patient the treatment plan was: Pending xray            Clinical Impression <redacted file path>:  1. Chronic pain of left knee       Condition: Stable and Improved  Disposition <redacted file path>: Pending

## 2023-05-25 ENCOUNTER — Other Ambulatory Visit (INDEPENDENT_AMBULATORY_CARE_PROVIDER_SITE_OTHER): Payer: Self-pay | Admitting: Physician Assistant

## 2023-05-25 ENCOUNTER — Ambulatory Visit: Payer: Medicare Other | Admitting: Physician Assistant

## 2023-05-25 MED ORDER — CYCLOBENZAPRINE 10 MG TABLET
10.0000 mg | ORAL_TABLET | Freq: Three times a day (TID) | ORAL | 1 refills | Status: DC | PRN
Start: 2023-05-25 — End: 2023-06-13

## 2023-05-25 NOTE — Telephone Encounter (Signed)
Patient called stating she did not receive refill last time she was here for appt... med was last refilled on 03/31/23... will send refill

## 2023-05-26 ENCOUNTER — Other Ambulatory Visit (INDEPENDENT_AMBULATORY_CARE_PROVIDER_SITE_OTHER): Payer: Self-pay | Admitting: Physician Assistant

## 2023-05-26 MED ORDER — ACETAMINOPHEN 300 MG-CODEINE 30 MG TABLET
1.0000 | ORAL_TABLET | Freq: Three times a day (TID) | ORAL | 0 refills | Status: DC
Start: 2023-05-26 — End: 2023-05-30

## 2023-05-26 NOTE — Telephone Encounter (Signed)
Patient called and stated she will be out of her medication before next appointment where it got changed due to providers vacation.Marland KitchenMarland Kitchen

## 2023-05-30 ENCOUNTER — Other Ambulatory Visit: Payer: Self-pay

## 2023-05-30 ENCOUNTER — Ambulatory Visit: Payer: Medicare Other | Attending: Physician Assistant | Admitting: Physician Assistant

## 2023-05-30 ENCOUNTER — Other Ambulatory Visit (INDEPENDENT_AMBULATORY_CARE_PROVIDER_SITE_OTHER): Payer: Self-pay | Admitting: Physician Assistant

## 2023-05-30 ENCOUNTER — Encounter (INDEPENDENT_AMBULATORY_CARE_PROVIDER_SITE_OTHER): Payer: Self-pay | Admitting: Physician Assistant

## 2023-05-30 DIAGNOSIS — M1990 Unspecified osteoarthritis, unspecified site: Secondary | ICD-10-CM

## 2023-05-30 DIAGNOSIS — M1712 Unilateral primary osteoarthritis, left knee: Secondary | ICD-10-CM

## 2023-05-30 DIAGNOSIS — F32A Depression, unspecified: Secondary | ICD-10-CM

## 2023-05-30 DIAGNOSIS — F419 Anxiety disorder, unspecified: Secondary | ICD-10-CM

## 2023-05-30 DIAGNOSIS — G8929 Other chronic pain: Secondary | ICD-10-CM

## 2023-05-30 DIAGNOSIS — M25562 Pain in left knee: Secondary | ICD-10-CM

## 2023-05-30 DIAGNOSIS — I10 Essential (primary) hypertension: Secondary | ICD-10-CM

## 2023-05-30 MED ORDER — PROMETHAZINE-DM 6.25 MG-15 MG/5 ML ORAL SYRUP
5.0000 mL | ORAL_SOLUTION | Freq: Four times a day (QID) | ORAL | 0 refills | Status: AC | PRN
Start: 2023-05-30 — End: 2023-06-06

## 2023-05-30 MED ORDER — NEBIVOLOL 10 MG TABLET
10.0000 mg | ORAL_TABLET | Freq: Every day | ORAL | 1 refills | Status: DC
Start: 2023-05-30 — End: 2023-08-23

## 2023-05-30 MED ORDER — LORAZEPAM 2 MG TABLET
2.0000 mg | ORAL_TABLET | Freq: Every evening | ORAL | 2 refills | Status: DC | PRN
Start: 2023-05-30 — End: 2023-08-01

## 2023-05-30 NOTE — Telephone Encounter (Signed)
Patient did a telemed today for medication refill.

## 2023-05-30 NOTE — Progress Notes (Signed)
INTERNAL MEDICINE, [name]  [address]  [place] [address]        Telephone Visit    Name:  Christine Mcmillan MRN: [id number]   Date:  05/30/2023 Age:   77 y.o.     The patient/family initiated a request for telephone service.  Verbal consent for this service was obtained from the patient/family.    TELEMEDICINE DOCUMENTATION:    [facility]    Patient/family aware of provider location:  yes  Patient/family consent for telemedicine:  yes  Examination observed and performed by:  Eyvonne Mechanic, PA-C    Chief Complaint   Patient presents with    Medication Refill    Emergency Room Follow Up     Knee pain        Call notes:  Dariany Mcmillan is a 77 y.o. female being seen via TeleMed visit today for follow-up concerning recent ER visit for left knee pain.  Patient states that time it was swelling and causing her increased pain worse than normal.  She has a history of chronic degenerative joint disease for which she sees Dr. Lequita Halt and has a follow-up appointment scheduled.  Reports pain today improved.    ER records note:       Call notes:  Luisanna Schielke is a 77 y.o. female who states she   Specialty: HOSPITALIST     ED Provider Notes     Signed     Date of Service: 05/20/23 2033  Note Received: 05/30/23 1532              ED Provider Note     Subjective      HPI  CECILLIA Mcmillan is a 77 year old female amatory to the ED with family member with complaint of left knee pain the patient states that she fell last week on her knees but states that the left knee pain has never gotten better has gotten worse.  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  She denies any other injuries     This patient  is deemed to be low risk for COVID-19.  The patient does not at this time require isolation, treatment or testing including workup in PPE beyond hospital wide policy of universal precautions.    Objective       Vitals <redacted file path>:          ED Triage Vitals [05/20/23 2023]   BP Pulse Resp Temp SpO2 Flow (L/min) (Oxygen Therapy)   (!) 143/76 83 20 98.4 F (36.9 C) 98 % --      Physical Exam       Nursing notes and vital signs reviewed.     Physical Exam  Constitutional:       Appearance: Normal appearance.   Cardiovascular:      Rate and Rhythm: Normal rate and regular rhythm.      Heart sounds: Normal heart sounds.   Pulmonary:      Breath sounds: Normal breath sounds.   Abdominal:     Non tender, non distended  Musculoskeletal:     There is left knee prepatellar tenderness palpation noted with limited range of motion flexion extension of left knee due to pain with crepitus noted.  Vascular neuro is intact.  There is no cyanosis or edema.  Skin:     General: Skin is warm and dry.   Neurological:  General: No focal deficit present.      Mental Status: He is alert and oriented to person, place, and time.           INITIAL PLAN:   Physical examination, indicated diagnostic studies and treatments ..and review old records with disposition and treatment to be determined by results.              DATA REVIEW/RADIOLOGIC EXAMINATIONS:  No results found for this visit on 05/20/23.        ED COURSE, DIFFERENTIAL DIAGNOSIS AND MEDICAL DECISION MAKING:  8:25 PM: Patient seen examined, left knee x-ray ordered.  Patient declined analgesics     Transfer of Care  Case was discussed with Dr. Milus Glazier who will be taking over the care of the patient in the Emergency Department pending laboratory studies and imaging and treatment response.  Discussion of case included pertinent findings within the history, physical exam, lab, abd radiological findings, along with current assessments and recommendations.  Pertinent medications taken prior to arrival and within the Emergency Department were also reviewed with the accepting practitioner.        At time I assumed care of the patient the treatment plan was: Pending xray      IMPRESSION:    No fracture or radiodense foreign body.   Moderate tricompartmental osteoarthritis.   Progressed suprapatellar effusion, concerning for internal derangement.  If there is continuing clinical concern, elective MR could be considered.         Clinical Impression <redacted file path>:  1. Chronic pain of left knee       Condition: Stable and Improved  Disposition <redacted file path>: Pending               Electronically signed by Arminda Resides, MD at 05/21/23 [phone]       Patient currently takes Tylenol No. 3 for her chronic joint pain and requests refills.    She also has a history of hypertension for which he states blood pressure has been stable but medication refills needed.  Denies headaches dizziness chest pain.    Follow-up chronic anxiety for which she takes Ativan p.r.n. and needs refills also.  At this time anxiety is stable at current dose and she denies panic attacks or new issues.    Patient also noted to have a chronic cough for which she uses promethazine p.r.n.Marland Kitchen  She has been evaluated by pulmonologist in the past workup unremarkable.      Current meds:     acetaminophen-codeine (TYLENOL #3) 300-30 mg Oral Tablet Take 1 Tablet by mouth Three times a day for 7 days    amLODIPine (NORVASC) 5 mg Oral Tablet Take 1 Tablet (5 mg total) by mouth Once a day    cyclobenzaprine (FLEXERIL) 10 mg Oral Tablet Take 1 Tablet (10 mg total) by mouth Three times a day as needed for Muscle spasms    d-mannose 500 mg Oral Capsule Take 1 Capsule (500 mg total) by mouth Twice daily    diclofenac sodium (PENNSAID) 20 mg/gram /actuation(2 %) solution in metered-dose pump Apply 1 Application  topically Twice daily    ergocalciferol, vitamin D2, (DRISDOL) 1,250 mcg (50,000 unit) Oral Capsule Take 1 Capsule (50,000 Units total) by mouth Every 7 days    ezetimibe (ZETIA) 10 mg Oral Tablet Take 1 Tablet (10 mg total) by mouth Every evening    furosemide (LASIX) 40 mg Oral Tablet Take 1 Tablet (40 mg total) by mouth Once per day  as needed for Other (Edema)    icosapent ethyL (VASCEPA) 1 gram Oral Capsule Take 2 Capsules (2 g total) by mouth Twice daily with food    LORazepam (ATIVAN) 2 mg Oral Tablet Take 1 Tablet (2 mg total) by mouth Every night as needed for Anxiety    NALOXONE 4 MG/ACTUATION NASAL SPRAY 1 Spray by INTRANASAL route Every 2 minutes as needed for actual or suspected opioid overdose. Call 911 if used.    nebivoloL (BYSTOLIC) 10 mg Oral Tablet Take 1 Tablet (10 mg total) by mouth Once a day    nitrofurantoin macrocrystaL (MACRODANTIN) 50 mg Oral Capsule Take 1 Capsule (50 mg total) by mouth Every night    omega-3 fatty acid (LOVAZA) 1 gram Oral Capsule Take 2 Capsules (2 g total) by mouth Twice daily    omeprazole (PRILOSEC) 20 mg Oral Capsule, Delayed Release(E.C.) Take 1 Capsule (20 mg total) by mouth Once a day    pravastatin (PRAVACHOL) 80 mg Oral Tablet Take 1 Tablet (80 mg total) by mouth Once a day    promethazine-dextromethorphan (PHENERGAN-DM) 6.25-15 mg/5 mL Oral Syrup Take 5 mL by mouth Four times a day as needed for Cough for up to 7 days    rizatriptan (MAXALT-MLT) 5 mg Oral Tablet, Rapid Dissolve Take 1 Tablet (5 mg total) by mouth Once, as needed May repeat in 2 hours if needed.    traZODone (DESYREL) 50 mg Oral Tablet Take 1 Tablet (50 mg total) by mouth Every night        Allergies   Allergen Reactions    Acetic Acid-Aluminum Acetate Swelling    Sertraline  Other Adverse Reaction (Add comment)     Headache          Past Medical History:   Diagnosis Date    Abnormal renal function     Anxiety     Arthralgia     Bell's palsy     Chronic hypokalemia     COVID-19 vaccine series completed     Depression     Esophageal reflux     History of memory loss     History of recurrent UTIs     Hypertension         Social History     Socioeconomic History    Marital status: Divorced   Tobacco Use    Smoking status: Never    Smokeless tobacco: Never   Substance and Sexual Activity    Alcohol use: Never    Drug use: Never      Social Determinants of Health     Financial Resource Strain: Low Risk  (07/22/2022)    Financial Resource Strain     SDOH Financial: No   Transportation Needs: Low Risk  (07/22/2022)    Transportation Needs     SDOH Transportation: No   Social Connections: Low Risk  (07/22/2022)    Social Connections     SDOH Social Isolation: 5 or more times a week   Intimate Partner Violence: Low Risk  (07/22/2022)    Intimate Partner Violence     SDOH Domestic Violence: No   Housing Stability: Low Risk  (07/22/2022)    Housing Stability     SDOH Housing Situation: I have housing.     SDOH Housing Worry: No        Past Surgical History:   Procedure Laterality Date    BRAIN TUMOR EXCISION      HX LIPOMA RESECTION N/A     Lipoma on  back        Family Medical History:       Problem Relation (Age of Onset)    Cancer Mother             Physical Exam  Constitutional:       Comments: Patient able to relay history today without issue   Pulmonary:      Effort: Pulmonary effort is normal.   Neurological:      Mental Status: She is alert.   Psychiatric:         Mood and Affect: Mood normal.      Assessment & plan:      ICD-10-CM    1. Chronic pain of left knee  M25.562     G89.29       2. Osteoarthritis, unspecified osteoarthritis type, unspecified site  M19.90       3. Essential hypertension  I10       4. Anxiety and depression  F41.9     F32.A             Plan    Emergency room records reviewed in detail  Meds refilled as noted  Patient to follow-up with Dr. Lequita Halt as scheduled    Total provider time spent with the patient on the phone: 18 minutes.    Post-Discharge Follow Up Appointments       Tuesday Jun 28, 2023    Return Patient Visit with Eyvonne Mechanic, PA-C at  1:30 PM      Tuesday Jul 05, 2023    Appointment with Eyvonne Mechanic, PA-C at  2:30 PM      Wednesday Jul 27, 2023    Return Patient Visit with Eyvonne Mechanic, PA-C at  1:00 PM      Monday Nov 21, 2023    Return Telephone Visit with Eyvonne Mechanic, PA-C at  1:00 PM      Internal Medicine,  [facility]  [facility] [place]  [address]  [place] [address]  [phone]             This note was partially created using MModal Fluency Direct system (voice recognition software) and is inherently subject to errors including those of syntax and "sound-alike" substitutions which may escape proofreading.  In such instances, original meaning may be extrapolated by contextual derivation.    Shunsuke Granzow, PA-C

## 2023-05-31 MED ORDER — ACETAMINOPHEN 300 MG-CODEINE 30 MG TABLET
1.0000 | ORAL_TABLET | Freq: Three times a day (TID) | ORAL | 0 refills | Status: DC
Start: 2023-05-31 — End: 2023-06-28

## 2023-06-11 ENCOUNTER — Other Ambulatory Visit (INDEPENDENT_AMBULATORY_CARE_PROVIDER_SITE_OTHER): Payer: Self-pay | Admitting: Physician Assistant

## 2023-06-13 ENCOUNTER — Other Ambulatory Visit (INDEPENDENT_AMBULATORY_CARE_PROVIDER_SITE_OTHER): Payer: Self-pay | Admitting: Physician Assistant

## 2023-06-13 ENCOUNTER — Telehealth (INDEPENDENT_AMBULATORY_CARE_PROVIDER_SITE_OTHER): Payer: Self-pay | Admitting: Physician Assistant

## 2023-06-13 DIAGNOSIS — N390 Urinary tract infection, site not specified: Secondary | ICD-10-CM

## 2023-06-22 ENCOUNTER — Ambulatory Visit (INDEPENDENT_AMBULATORY_CARE_PROVIDER_SITE_OTHER): Payer: Self-pay | Admitting: Physician Assistant

## 2023-06-28 ENCOUNTER — Other Ambulatory Visit: Payer: Self-pay

## 2023-06-28 ENCOUNTER — Ambulatory Visit: Payer: Medicare Other | Attending: Physician Assistant | Admitting: Physician Assistant

## 2023-06-28 ENCOUNTER — Encounter (INDEPENDENT_AMBULATORY_CARE_PROVIDER_SITE_OTHER): Payer: Self-pay | Admitting: Physician Assistant

## 2023-06-28 ENCOUNTER — Other Ambulatory Visit (INDEPENDENT_AMBULATORY_CARE_PROVIDER_SITE_OTHER): Payer: Self-pay | Admitting: Physician Assistant

## 2023-06-28 VITALS — BP 136/84 | HR 71 | Temp 98.1°F | Wt 156.0 lb

## 2023-06-28 DIAGNOSIS — F419 Anxiety disorder, unspecified: Secondary | ICD-10-CM | POA: Insufficient documentation

## 2023-06-28 DIAGNOSIS — G8929 Other chronic pain: Secondary | ICD-10-CM | POA: Insufficient documentation

## 2023-06-28 DIAGNOSIS — F32A Depression, unspecified: Secondary | ICD-10-CM | POA: Insufficient documentation

## 2023-06-28 DIAGNOSIS — Z87440 Personal history of urinary (tract) infections: Secondary | ICD-10-CM

## 2023-06-28 DIAGNOSIS — N390 Urinary tract infection, site not specified: Secondary | ICD-10-CM | POA: Insufficient documentation

## 2023-06-28 DIAGNOSIS — N19 Unspecified kidney failure: Secondary | ICD-10-CM

## 2023-06-28 DIAGNOSIS — R399 Unspecified symptoms and signs involving the genitourinary system: Secondary | ICD-10-CM | POA: Insufficient documentation

## 2023-06-28 DIAGNOSIS — N289 Disorder of kidney and ureter, unspecified: Secondary | ICD-10-CM | POA: Insufficient documentation

## 2023-06-28 DIAGNOSIS — M549 Dorsalgia, unspecified: Secondary | ICD-10-CM | POA: Insufficient documentation

## 2023-06-28 DIAGNOSIS — R053 Chronic cough: Secondary | ICD-10-CM | POA: Insufficient documentation

## 2023-06-28 DIAGNOSIS — G4700 Insomnia, unspecified: Secondary | ICD-10-CM | POA: Insufficient documentation

## 2023-06-28 DIAGNOSIS — I10 Essential (primary) hypertension: Secondary | ICD-10-CM | POA: Insufficient documentation

## 2023-06-28 MED ORDER — PHENAZOPYRIDINE 100 MG TABLET
100.0000 mg | ORAL_TABLET | Freq: Three times a day (TID) | ORAL | 0 refills | Status: AC
Start: 2023-06-28 — End: 2023-07-01

## 2023-06-28 MED ORDER — AMLODIPINE 5 MG TABLET
5.0000 mg | ORAL_TABLET | Freq: Every day | ORAL | 1 refills | Status: DC
Start: 2023-06-28 — End: 2023-09-14

## 2023-06-28 MED ORDER — CYANOCOBALAMIN (VIT B-12) 1,000 MCG/ML INJECTION SOLUTION
1000.0000 ug | INTRAMUSCULAR | Status: AC
Start: 2023-06-28 — End: 2023-06-28
  Administered 2023-06-28: 1000 ug via SUBCUTANEOUS

## 2023-06-28 MED ORDER — SULFAMETHOXAZOLE 800 MG-TRIMETHOPRIM 160 MG TABLET
1.0000 | ORAL_TABLET | Freq: Two times a day (BID) | ORAL | 0 refills | Status: DC
Start: 2023-06-28 — End: 2023-08-01

## 2023-06-28 MED ORDER — ACETAMINOPHEN 300 MG-CODEINE 30 MG TABLET
1.0000 | ORAL_TABLET | Freq: Three times a day (TID) | ORAL | 0 refills | Status: DC
Start: 2023-06-28 — End: 2023-08-01

## 2023-06-28 NOTE — Assessment & Plan Note (Signed)
Continue ativan 2mg  q hs

## 2023-06-28 NOTE — Assessment & Plan Note (Signed)
Continue tylenol #3 prn

## 2023-06-28 NOTE — Nursing Note (Signed)
Patient is here for 1m CDM

## 2023-06-28 NOTE — Assessment & Plan Note (Signed)
Controlled Continue current meds 

## 2023-06-28 NOTE — Telephone Encounter (Signed)
Patient is here for routine follow up for medication refill.
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
## 2023-06-28 NOTE — Assessment & Plan Note (Signed)
Rx prometh dm given- encouraged to avoid excessive use of codeine

## 2023-06-29 LAB — URINALYSIS, MACROSCOPIC
BILIRUBIN: NEGATIVE mg/dL
BLOOD: NEGATIVE mg/dL
GLUCOSE: NEGATIVE mg/dL
KETONES: NEGATIVE mg/dL
LEUKOCYTES: NEGATIVE WBCs/uL
NITRITE: NEGATIVE
PH: 5.5 (ref 5.0–9.0)
PROTEIN: 10 mg/dL
SPECIFIC GRAVITY: 1.027 (ref 1.002–1.030)
UROBILINOGEN: NORMAL mg/dL

## 2023-06-29 LAB — URINALYSIS, MICROSCOPIC
BACTERIA: NEGATIVE /hpf
RBCS: <1 /hpf (ref <4)
WBCS: <1 /hpf (ref <6)

## 2023-07-20 ENCOUNTER — Ambulatory Visit (INDEPENDENT_AMBULATORY_CARE_PROVIDER_SITE_OTHER): Payer: Self-pay | Admitting: Physician Assistant

## 2023-07-27 ENCOUNTER — Ambulatory Visit (INDEPENDENT_AMBULATORY_CARE_PROVIDER_SITE_OTHER): Payer: Medicare Other | Admitting: Physician Assistant

## 2023-08-01 ENCOUNTER — Ambulatory Visit: Payer: Medicare Other | Attending: Physician Assistant | Admitting: Physician Assistant

## 2023-08-01 ENCOUNTER — Other Ambulatory Visit (INDEPENDENT_AMBULATORY_CARE_PROVIDER_SITE_OTHER): Payer: Self-pay | Admitting: Physician Assistant

## 2023-08-01 ENCOUNTER — Encounter (INDEPENDENT_AMBULATORY_CARE_PROVIDER_SITE_OTHER): Payer: Self-pay | Admitting: Physician Assistant

## 2023-08-01 ENCOUNTER — Other Ambulatory Visit: Payer: Self-pay

## 2023-08-01 DIAGNOSIS — M545 Low back pain, unspecified: Secondary | ICD-10-CM

## 2023-08-01 DIAGNOSIS — M159 Polyosteoarthritis, unspecified: Secondary | ICD-10-CM

## 2023-08-01 DIAGNOSIS — R053 Chronic cough: Secondary | ICD-10-CM

## 2023-08-01 DIAGNOSIS — G8929 Other chronic pain: Secondary | ICD-10-CM

## 2023-08-01 DIAGNOSIS — I10 Essential (primary) hypertension: Secondary | ICD-10-CM

## 2023-08-01 DIAGNOSIS — M1990 Unspecified osteoarthritis, unspecified site: Secondary | ICD-10-CM

## 2023-08-01 DIAGNOSIS — J329 Chronic sinusitis, unspecified: Secondary | ICD-10-CM

## 2023-08-01 DIAGNOSIS — F419 Anxiety disorder, unspecified: Secondary | ICD-10-CM

## 2023-08-01 MED ORDER — METHYLPREDNISOLONE 4 MG TABLETS IN A DOSE PACK
ORAL_TABLET | ORAL | 0 refills | Status: DC
Start: 2023-08-01 — End: 2023-08-29

## 2023-08-01 MED ORDER — PRAVASTATIN 80 MG TABLET
80.0000 mg | ORAL_TABLET | Freq: Every day | ORAL | 1 refills | Status: DC
Start: 2023-08-01 — End: 2023-11-23

## 2023-08-01 MED ORDER — FUROSEMIDE 40 MG TABLET
40.0000 mg | ORAL_TABLET | Freq: Every day | ORAL | 1 refills | Status: DC | PRN
Start: 2023-08-01 — End: 2023-11-23

## 2023-08-01 MED ORDER — OMEPRAZOLE 20 MG CAPSULE,DELAYED RELEASE
20.0000 mg | DELAYED_RELEASE_CAPSULE | Freq: Every day | ORAL | 1 refills | Status: DC
Start: 2023-08-01 — End: 2023-11-29

## 2023-08-01 MED ORDER — PROMETHAZINE 6.25 MG-CODEINE 10 MG/5 ML SYRUP
5.0000 mL | ORAL_SOLUTION | Freq: Four times a day (QID) | ORAL | 0 refills | Status: DC | PRN
Start: 2023-08-01 — End: 2023-08-29

## 2023-08-01 MED ORDER — NITROFURANTOIN MACROCRYSTAL 50 MG CAPSULE
50.0000 mg | ORAL_CAPSULE | Freq: Every evening | ORAL | 3 refills | Status: DC
Start: 2023-08-01 — End: 2023-12-23

## 2023-08-01 MED ORDER — ERGOCALCIFEROL (VITAMIN D2) 1,250 MCG (50,000 UNIT) CAPSULE
50000.0000 [IU] | ORAL_CAPSULE | ORAL | 2 refills | Status: DC
Start: 2023-08-01 — End: 2023-09-27

## 2023-08-01 MED ORDER — AMOXICILLIN 875 MG-POTASSIUM CLAVULANATE 125 MG TABLET
1.0000 | ORAL_TABLET | Freq: Two times a day (BID) | ORAL | 0 refills | Status: DC
Start: 2023-08-01 — End: 2023-08-29

## 2023-08-01 MED ORDER — LORAZEPAM 2 MG TABLET
2.0000 mg | ORAL_TABLET | Freq: Every evening | ORAL | 2 refills | Status: DC | PRN
Start: 2023-08-01 — End: 2023-08-29

## 2023-08-01 NOTE — Telephone Encounter (Signed)
Patient completed telemed today for routine 1 month visit for medication refill.

## 2023-08-01 NOTE — Progress Notes (Signed)
[facility], [name]  [address]  [place] [address]        Telephone Visit    Name:  Christine Mcmillan MRN: [id number]   Date:  08/01/2023 Age:   77 y.o.     The patient/family initiated a request for telephone service.  Verbal consent for this service was obtained from the patient/family.    TELEMEDICINE DOCUMENTATION:    [facility]    Patient/family aware of provider location:  yes  Patient/family consent for telemedicine:  yes  Examination observed and performed by:  Eyvonne Mechanic, PA-C    Chief Complaint   Patient presents with    Medication Refill     Sinus infection        Call notes:  Christine Mcmillan is a 77 y.o. female being seen via TeleMed  for f/u however has complaints of sinus cong w pressure nasal cong drainage sneezing x 5 days. Pain & pressure around eyes noted. Fever yest however noted of 101. Denies sob wheezing however cough noted nonproductive. No abd pain n/v/d. No body aches.      Patient currently takes Tylenol No. 3 for her chronic joint pain and requests refills.    F/u BP/hypertension for which he states blood pressure has been stable when checked outpatient.  She is currently on Bystolic 10 mg  Denies headaches dizziness chest pain.    Follow-up chronic anxiety for which she takes Ativan p.r.n. and needs refills also.  At this time anxiety is stable at current dose and she denies panic attacks or new issues.    Follow-up chronic back pain/osteoarthritis for which patient uses Tylenol 3 p.r.n. pain.  She has a lot of other joint pain including hip knee hands.  Patient states Tylenol 3 does help her pain and she also uses topical Pennsaid as needed.  No side effects or issues with medication noted and refills on med requested.    Patient once again has a history of a chronic cough for which she uses promethazine with codeine p.r.n.Marland Kitchen  She has been evaluated by pulmonologist in the past workup unremarkable.  Requests refills of cough med.      Current  meds:     acetaminophen-codeine (TYLENOL #3) 300-30 mg Oral Tablet Take 1 Tablet by mouth Three times a day    amLODIPine (NORVASC) 5 mg Oral Tablet Take 1 Tablet (5 mg total) by mouth Once a day    amoxicillin-pot clavulanate (AUGMENTIN) 875-125 mg Oral Tablet Take 1 Tablet by mouth Twice daily for 10 days    cyclobenzaprine (FLEXERIL) 10 mg Oral Tablet TAKE ONE TABLET BY MOUTH THREE TIMES DAILY FOR MUSCLE SPASMS    d-mannose 500 mg Oral Capsule Take 1 Capsule (500 mg total) by mouth Twice daily    diclofenac sodium (PENNSAID) 20 mg/gram /actuation(2 %) solution in metered-dose pump Apply 1 Application  topically Twice daily    ergocalciferol, vitamin D2, (DRISDOL) 1,250 mcg (50,000 unit) Oral Capsule Take 1 Capsule (50,000 Units total) by mouth Every 7 days    furosemide (LASIX) 40 mg Oral Tablet Take 1 Tablet (40 mg total) by mouth Once per day as needed for Other (Edema)    LORazepam (ATIVAN) 2 mg Oral Tablet Take 1 Tablet (2 mg total) by mouth Every night as needed for Anxiety    Methylprednisolone (MEDROL DOSEPACK) 4 mg Oral Tablets, Dose Pack Take as instructed.    NALOXONE 4 MG/ACTUATION NASAL SPRAY 1 Spray by INTRANASAL route Every 2  minutes as needed for actual or suspected opioid overdose. Call 911 if used.    nebivoloL (BYSTOLIC) 10 mg Oral Tablet Take 1 Tablet (10 mg total) by mouth Once a day    nitrofurantoin macrocrystaL (MACRODANTIN) 50 mg Oral Capsule Take 1 Capsule (50 mg total) by mouth Every night    omeprazole (PRILOSEC) 20 mg Oral Capsule, Delayed Release(E.C.) Take 1 Capsule (20 mg total) by mouth Once a day    pravastatin (PRAVACHOL) 80 mg Oral Tablet Take 1 Tablet (80 mg total) by mouth Once a day    promethazine-codeine (PHENERGAN WITH CODEINE) 6.25-10 mg/5 mL Oral Syrup Take 5 mL by mouth Four times a day as needed for Cough for up to 10 days    rizatriptan (MAXALT-MLT) 5 mg Oral Tablet, Rapid Dissolve Take 1 Tablet (5 mg total) by mouth Once, as needed May repeat in 2 hours if needed.     traZODone (DESYREL) 50 mg Oral Tablet Take 1 Tablet (50 mg total) by mouth Every night        Allergies   Allergen Reactions    Acetic Acid-Aluminum Acetate Swelling    Sertraline  Other Adverse Reaction (Add comment)     Headache          Past Medical History:   Diagnosis Date    Abnormal renal function     Anxiety     Arthralgia     Bell's palsy     Chronic hypokalemia     COVID-19 vaccine series completed     Depression     Esophageal reflux     History of memory loss     History of recurrent UTIs     Hypertension         Social History     Socioeconomic History    Marital status: Divorced   Tobacco Use    Smoking status: Never    Smokeless tobacco: Never   Substance and Sexual Activity    Alcohol use: Never    Drug use: Never     Social Determinants of Health     Financial Resource Strain: Low Risk  (07/22/2022)    Financial Resource Strain     SDOH Financial: No   Transportation Needs: Low Risk  (07/22/2022)    Transportation Needs     SDOH Transportation: No   Social Connections: Low Risk  (07/22/2022)    Social Connections     SDOH Social Isolation: 5 or more times a week   Intimate Partner Violence: Low Risk  (07/22/2022)    Intimate Partner Violence     SDOH Domestic Violence: No   Housing Stability: Low Risk  (07/22/2022)    Housing Stability     SDOH Housing Situation: I have housing.     SDOH Housing Worry: No        Past Surgical History:   Procedure Laterality Date    BRAIN TUMOR EXCISION      HX LIPOMA RESECTION N/A     Lipoma on back        Family Medical History:       Problem Relation (Age of Onset)    Cancer Mother             Physical Exam  Constitutional:       Comments: Patient able to relay history today without issue   Pulmonary:      Effort: Pulmonary effort is normal.   Neurological:      Mental Status: She is alert.  Psychiatric:         Mood and Affect: Mood normal.      Assessment & plan:      ICD-10-CM    1. Sinusitis, unspecified chronicity, unspecified location  J32.9       2. Essential  hypertension  I10       3. Anxiety and depression  F41.9     F32.A       4. Chronic low back pain, unspecified back pain laterality, unspecified whether sciatica present  M54.50     G89.29       5. Osteoarthritis, unspecified osteoarthritis type, unspecified site  M19.90       6. Chronic cough  R05.3               Plan    Prometh with codeine p.r.n. cough given  Augmentin 875 b.i.d. times 10 days  Medrol Dosepak as directed  Med refilled as noted    Total provider time spent with the patient on the phone: 13 minutes.    Post-Discharge Follow Up Appointments       Monday Aug 29, 2023    Return Patient Visit with Eyvonne Mechanic, PA-C at  1:30 PM      Tuesday Sep 27, 2023    Return Patient Visit with Eyvonne Mechanic, PA-C at  2:30 PM      Monday Oct 24, 2023    Return Patient Visit with Eyvonne Mechanic, PA-C at  1:30 PM      Monday Nov 21, 2023    Return Telephone Visit with Eyvonne Mechanic, PA-C at  1:00 PM      Wednesday Nov 23, 2023    Return Patient Visit with Eyvonne Mechanic, PA-C at  1:00 PM      [facility]  [facility] [place]  [address]  [place] [address]  [phone]             This note was partially created using MModal Fluency Direct system (voice recognition software) and is inherently subject to errors including those of syntax and "sound-alike" substitutions which may escape proofreading.  In such instances, original meaning may be extrapolated by contextual derivation.    Kelvin Burpee, PA-C

## 2023-08-02 MED ORDER — ACETAMINOPHEN 300 MG-CODEINE 30 MG TABLET
1.0000 | ORAL_TABLET | Freq: Three times a day (TID) | ORAL | 0 refills | Status: DC
Start: 2023-08-02 — End: 2023-08-29

## 2023-08-05 ENCOUNTER — Telehealth (INDEPENDENT_AMBULATORY_CARE_PROVIDER_SITE_OTHER): Payer: Self-pay | Admitting: Physician Assistant

## 2023-08-05 NOTE — Telephone Encounter (Signed)
Patient called stating that she needed a refill on the Prometh with codeine, because she is still congested and still not feeling the best and she did not want to be miserable during the weekend. Medication was sent to pharmacy on 08/01/23 for a 10 Christine Mcmillan supply, it is flagged on prescription that patient should not run out of medication until 08/11/23.

## 2023-08-22 ENCOUNTER — Other Ambulatory Visit (INDEPENDENT_AMBULATORY_CARE_PROVIDER_SITE_OTHER): Payer: Self-pay | Admitting: Physician Assistant

## 2023-08-22 MED ORDER — CYCLOBENZAPRINE 10 MG TABLET
10.0000 mg | ORAL_TABLET | Freq: Three times a day (TID) | ORAL | 1 refills | Status: DC
Start: 2023-08-22 — End: 2023-09-19

## 2023-08-23 ENCOUNTER — Other Ambulatory Visit (INDEPENDENT_AMBULATORY_CARE_PROVIDER_SITE_OTHER): Payer: Self-pay | Admitting: Physician Assistant

## 2023-08-25 ENCOUNTER — Ambulatory Visit (INDEPENDENT_AMBULATORY_CARE_PROVIDER_SITE_OTHER): Payer: Self-pay | Admitting: Physician Assistant

## 2023-08-29 ENCOUNTER — Ambulatory Visit: Payer: Medicare Other | Attending: Physician Assistant | Admitting: Physician Assistant

## 2023-08-29 ENCOUNTER — Other Ambulatory Visit: Payer: Self-pay

## 2023-08-29 ENCOUNTER — Encounter (INDEPENDENT_AMBULATORY_CARE_PROVIDER_SITE_OTHER): Payer: Self-pay | Admitting: Physician Assistant

## 2023-08-29 VITALS — BP 130/78 | HR 59 | Temp 97.8°F | Ht 60.0 in | Wt 155.2 lb

## 2023-08-29 DIAGNOSIS — R053 Chronic cough: Secondary | ICD-10-CM | POA: Insufficient documentation

## 2023-08-29 DIAGNOSIS — I10 Essential (primary) hypertension: Secondary | ICD-10-CM | POA: Insufficient documentation

## 2023-08-29 DIAGNOSIS — N390 Urinary tract infection, site not specified: Secondary | ICD-10-CM | POA: Insufficient documentation

## 2023-08-29 DIAGNOSIS — F32A Depression, unspecified: Secondary | ICD-10-CM | POA: Insufficient documentation

## 2023-08-29 DIAGNOSIS — G8929 Other chronic pain: Secondary | ICD-10-CM | POA: Insufficient documentation

## 2023-08-29 DIAGNOSIS — M545 Low back pain, unspecified: Secondary | ICD-10-CM | POA: Insufficient documentation

## 2023-08-29 DIAGNOSIS — F419 Anxiety disorder, unspecified: Secondary | ICD-10-CM | POA: Insufficient documentation

## 2023-08-29 DIAGNOSIS — M1990 Unspecified osteoarthritis, unspecified site: Secondary | ICD-10-CM | POA: Insufficient documentation

## 2023-08-29 MED ORDER — ACETAMINOPHEN 300 MG-CODEINE 30 MG TABLET
1.0000 | ORAL_TABLET | Freq: Three times a day (TID) | ORAL | 0 refills | Status: DC
Start: 2023-08-29 — End: 2023-09-27

## 2023-08-29 MED ORDER — PROMETHAZINE 6.25 MG-CODEINE 10 MG/5 ML SYRUP
5.0000 mL | ORAL_SOLUTION | Freq: Four times a day (QID) | ORAL | 0 refills | Status: AC | PRN
Start: 2023-08-29 — End: 2023-09-08

## 2023-08-29 MED ORDER — LORAZEPAM 2 MG TABLET
2.0000 mg | ORAL_TABLET | Freq: Every evening | ORAL | 2 refills | Status: DC | PRN
Start: 2023-08-29 — End: 2023-09-27

## 2023-08-29 NOTE — Progress Notes (Signed)
 [facility]  [address]  [place] [address]          Follow Up        Reason for Visit: Follow Up urine check    History of Present Illness  Christine Mcmillan is a 77 y.o. female who is being seen today in office for f/u as well as request to check urine as well today. + hx of  UTIs noted in the past. She has been having sweats along w chills. +mild burning. Pt takes macrodantin 50 mg q hs for proph and has been given estrogen cream to use 2 x weekly as well as dmannose but poorly compliant on use.    F/u BP/HTN w reading stable at 130/78.  Pt is currently taking Norvasc 5mg  po daily along w bystolic 10 mg daily.  Blood pressure has been stable when checked at home as well.  Denies chest pain headaches dizziness. She does have hx of some renal insuff w last GFR 52. Referral to th urologist as well as nephrologist has been made however pt did not f/u as scheduled and appt never rescheduled.    Follow-up chronic anxiety/insomnia for which patient uses Ativan 2 mg nightly.  Patient has went back and forth from several medications including Xanax Valium trazodone and still has issues sleeping as well as anxiety specifically in the evenings. Pt would like to increase the ativan but as discussed w her today risk of increased SE noted and i do not recommend increase at this time. She has used Lexapro as well as Effexor in the past for her depression but stopped medication after stating she did not feel like it was helping.  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  She does tell me the neurologist told her to be very careful about the Ativan making her unsteady and falling which she denies ever having an issue with.  Sometime back did refer her to the pulmonologist have sleep studies but she did not comply with appointment.    Follow-up chronic cough for which she has had for numerous years and was referred to the pulmonologist however  poorly compliant w appts.  She is prescription for promethazine DM  and prefers w codeine noting it helps her sleep as well.  She has used meds for the chronic cough for multiple years.  She has had pneumonia in the past but currently no chest congestion shortness a breath wheezing noted.    F/u chronic back pain w recent MRI of lumbar spine showing moderate scoliosis, old T12 compression fx, moderate DJD and mild spinal stenosis. Pt has chronic back pain and uses Tylenol #3  prn. A referral has also been made to neurosurgeon for eval but pt has not been seen yet.    Referred to Dr Concha Se also concerning her GERD issues as well as colonoscopy but pt did not go to appt and wants to wait at this time      PHQ Questionnaire         Past Medical History:   Diagnosis Date    Abnormal renal function     Anxiety     Arthralgia     Bell's palsy     Chronic hypokalemia     COVID-19 vaccine series completed     Depression     Esophageal reflux     History of memory loss     History of  recurrent UTIs     Hypertension          Past Surgical History:   Procedure Laterality Date    BRAIN TUMOR EXCISION      HX LIPOMA RESECTION N/A     Lipoma on back      Family Medical History:       Problem Relation (Age of Onset)    Cancer Mother            Social History     Tobacco Use    Smoking status: Never    Smokeless tobacco: Never   Substance Use Topics    Alcohol use: Never    Drug use: Never     Medication:  d-mannose 500 mg Oral Capsule, Take 1 Capsule (500 mg total) by mouth Twice daily  NALOXONE 4 MG/ACTUATION NASAL SPRAY, 1 Spray by INTRANASAL route Every 2 minutes as needed for actual or suspected opioid overdose. Call 911 if used.  nitrofurantoin macrocrystaL (MACRODANTIN) 50 mg Oral Capsule, Take 1 Capsule (50 mg total) by mouth Every night  acetaminophen-codeine (TYLENOL #3) 300-30 mg Oral Tablet, Take 1 Tablet by mouth Three times a day  amLODIPine (NORVASC) 5 mg Oral Tablet, Take 1 Tablet (5 mg total) by mouth Once a  day  amoxicillin-pot clavulanate (AUGMENTIN) 875-125 mg Oral Tablet, Take 1 Tablet by mouth Twice daily for 10 days  cyclobenzaprine (FLEXERIL) 10 mg Oral Tablet, Take 1 Tablet (10 mg total) by mouth Three times a day  diclofenac sodium (PENNSAID) 20 mg/gram /actuation(2 %) solution in metered-dose pump, Apply 1 Application  topically Twice daily  ergocalciferol, vitamin D2, (DRISDOL) 1,250 mcg (50,000 unit) Oral Capsule, Take 1 Capsule (50,000 Units total) by mouth Every 7 days  furosemide (LASIX) 40 mg Oral Tablet, Take 1 Tablet (40 mg total) by mouth Once per day as needed for Other (Edema)  LORazepam (ATIVAN) 2 mg Oral Tablet, Take 1 Tablet (2 mg total) by mouth Every night as needed for Anxiety  Methylprednisolone (MEDROL DOSEPACK) 4 mg Oral Tablets, Dose Pack, Take as instructed. (Patient not taking: Reported on 08/29/2023)  nebivoloL (BYSTOLIC) 10 mg Oral Tablet, TAKE ONE TABLET BY MOUTH ONCE DAILY  omeprazole (PRILOSEC) 20 mg Oral Capsule, Delayed Release(E.C.), Take 1 Capsule (20 mg total) by mouth Once a day  pravastatin (PRAVACHOL) 80 mg Oral Tablet, Take 1 Tablet (80 mg total) by mouth Once a day  promethazine-codeine (PHENERGAN WITH CODEINE) 6.25-10 mg/5 mL Oral Syrup, Take 5 mL by mouth Four times a day as needed for Cough for up to 10 days  rizatriptan (MAXALT-MLT) 5 mg Oral Tablet, Rapid Dissolve, Take 1 Tablet (5 mg total) by mouth Once, as needed May repeat in 2 hours if needed.  traZODone (DESYREL) 50 mg Oral Tablet, Take 1 Tablet (50 mg total) by mouth Every night    No facility-administered medications prior to visit.    Allergies:  Allergies   Allergen Reactions    Acetic Acid-Aluminum Acetate Swelling    Sertraline  Other Adverse Reaction (Add comment)     Headache         Physical Exam:  Vitals:    08/29/23 1327   BP: 130/78   Pulse: 59   Temp: 36.6 C (97.8 F)   Weight: 70.4 kg (155 lb 3.2 oz)   Height: 1.524 m (5')   BMI: 30.37   HC: 92 cm (36.22")      Vitals stable  General appearance:  alert, cooperative, in no acute distress  HEENT: Ears: clear, no effusion, no erythema; Throat: non-erythematous; no LAD, neck supple, moist mucus membranes  Still slight droop RT side of face/mouth  Lungs: clear to auscultation bilaterally; no wheezes or rhonchi   Heart: regular rate and rhythm; normal S1 & S2; no murmur  Abdomen: soft,  no tenderness suprapubic ; no cva tenderness noted; not distended; bowel sounds present  Extremities: extremities normal ROM, no cyanosis or edema , no rash  +DJD bilat hands/knees  Gait stable  Psych: alert and oriented x 3  Neuro: CN 2-12 grossly intact    Urine Dip Results:   Time collected: 1402  Glucose (Ref Range: Negative mg/dL): Negative  Bilirubin (Ref Range: Negative mg/dL): Negative  Ketones (Ref Range: Negative mg/dL): Negative  Urine Specific Gravity (Ref Range: 1.005 - 1.030): 1.025  Blood (urine) (Ref Range: Negative mg/dL): Negative  pH (Ref Range: 5.0 - 8.0): 6.0  Protein (Ref Range: Negative mg/dL): Negative  Urobilinogen (Ref Range: Normal): 0.2mg /dL (Normal)  Nitrite (Ref Range: Negative): Negative  Leukocytes (Ref Range: Negative WBC's/uL): Trace       Assessment & Plan  Recurrent UTI  Urine sent for culture  Essential hypertension  STABLE ON CURRENT MEDS  Anxiety and depression  CONTINUE ATIVAN AS DIRECTED- REFILLS GIVEN  Chronic cough  CONTINUE PROMETH DM PRN ALONG W PULM F/U  Chronic low back pain, unspecified back pain laterality, unspecified whether sciatica present  CONTINUE TYLENOL #3 PRN - REFILLS GIVEN  Osteoarthritis, unspecified osteoarthritis type, unspecified site       Orders Placed This Encounter    URINALYSIS, MACROSCOPIC AND MICROSCOPIC W/CULTURE REFLEX    URINALYSIS, MACROSCOPIC    URINALYSIS, MICROSCOPIC    POCT URINE DIPSTICK    promethazine-codeine (PHENERGAN WITH CODEINE) 6.25-10 mg/5 mL Oral Syrup              Post-Discharge Follow Up Appointments       Tuesday Sep 27, 2023    Return Patient Visit with Eyvonne Mechanic, PA-C at  2:30 PM       Monday Oct 24, 2023    Return Patient Visit with Eyvonne Mechanic, PA-C at  1:30 PM      Monday Nov 21, 2023    Return Telephone Visit with Eyvonne Mechanic, PA-C at  1:00 PM      Wednesday Nov 23, 2023    Return Patient Visit with Eyvonne Mechanic, PA-C at  1:00 PM      [facility], [facility]  [facility]  [address]  [place] [address]  [phone]              Seek medical attention for new or worsening symptoms.  Patient has been seen in this clinic within the last 3 years.     Dvonte Gatliff, PA-C          This note was partially created using MModal Fluency Direct system (voice recognition software) and is inherently subject to errors including those of syntax and "sound-alike" substitutions which may escape proofreading.  In such instances, original meaning may be extrapolated by contextual derivation.

## 2023-08-30 LAB — URINALYSIS, MACROSCOPIC
BILIRUBIN: NEGATIVE mg/dL
BLOOD: NEGATIVE mg/dL
GLUCOSE: NEGATIVE mg/dL
KETONES: NEGATIVE mg/dL
LEUKOCYTES: NEGATIVE WBCs/uL
NITRITE: NEGATIVE
PH: 5.5 (ref 5.0–9.0)
PROTEIN: NEGATIVE mg/dL
SPECIFIC GRAVITY: 1.007 (ref 1.002–1.030)
UROBILINOGEN: NORMAL mg/dL

## 2023-08-30 LAB — URINALYSIS, MICROSCOPIC
HYALINE CASTS: 3 /[LPF] — ABNORMAL HIGH (ref <0)
SQUAMOUS EPITHELIAL: <1 /[HPF] (ref <28)
WBCS: 1 /[HPF] (ref <6)

## 2023-09-14 ENCOUNTER — Inpatient Hospital Stay (HOSPITAL_BASED_OUTPATIENT_CLINIC_OR_DEPARTMENT_OTHER)
Admission: RE | Admit: 2023-09-14 | Discharge: 2023-09-14 | Disposition: A | Discharge status: Home / Self Care | Payer: Medicare Other | Source: Ambulatory Visit | Attending: Physician Assistant | Admitting: Physician Assistant

## 2023-09-14 ENCOUNTER — Other Ambulatory Visit: Payer: Self-pay

## 2023-09-14 ENCOUNTER — Ambulatory Visit: Payer: Medicare Other | Attending: Physician Assistant | Admitting: Physician Assistant

## 2023-09-14 ENCOUNTER — Encounter (INDEPENDENT_AMBULATORY_CARE_PROVIDER_SITE_OTHER): Payer: Self-pay | Admitting: Physician Assistant

## 2023-09-14 ENCOUNTER — Other Ambulatory Visit (INDEPENDENT_AMBULATORY_CARE_PROVIDER_SITE_OTHER): Payer: Self-pay | Admitting: Physician Assistant

## 2023-09-14 VITALS — BP 147/88 | HR 73 | Ht 60.0 in | Wt 157.0 lb

## 2023-09-14 DIAGNOSIS — R519 Headache, unspecified: Secondary | ICD-10-CM | POA: Insufficient documentation

## 2023-09-14 DIAGNOSIS — S0093XA Contusion of unspecified part of head, initial encounter: Secondary | ICD-10-CM

## 2023-09-14 DIAGNOSIS — J029 Acute pharyngitis, unspecified: Secondary | ICD-10-CM

## 2023-09-14 DIAGNOSIS — R079 Chest pain, unspecified: Secondary | ICD-10-CM

## 2023-09-14 DIAGNOSIS — I10 Essential (primary) hypertension: Secondary | ICD-10-CM

## 2023-09-14 DIAGNOSIS — N390 Urinary tract infection, site not specified: Secondary | ICD-10-CM | POA: Insufficient documentation

## 2023-09-14 DIAGNOSIS — M545 Low back pain, unspecified: Secondary | ICD-10-CM | POA: Insufficient documentation

## 2023-09-14 DIAGNOSIS — W19XXXA Unspecified fall, initial encounter: Secondary | ICD-10-CM

## 2023-09-14 DIAGNOSIS — R319 Hematuria, unspecified: Secondary | ICD-10-CM | POA: Insufficient documentation

## 2023-09-14 DIAGNOSIS — M25561 Pain in right knee: Secondary | ICD-10-CM

## 2023-09-14 DIAGNOSIS — H539 Unspecified visual disturbance: Secondary | ICD-10-CM

## 2023-09-14 DIAGNOSIS — M25562 Pain in left knee: Secondary | ICD-10-CM | POA: Insufficient documentation

## 2023-09-14 LAB — URINALYSIS, MACROSCOPIC
BILIRUBIN: NEGATIVE mg/dL
BLOOD: NEGATIVE mg/dL
GLUCOSE: NEGATIVE mg/dL
KETONES: NEGATIVE mg/dL
LEUKOCYTES: 25 WBCs/uL — AB
NITRITE: NEGATIVE
PH: 6 (ref 5.0–9.0)
PROTEIN: 10 mg/dL
SPECIFIC GRAVITY: 1.025 (ref 1.002–1.030)
UROBILINOGEN: NORMAL mg/dL

## 2023-09-14 LAB — POCT URINE DIPSTICK
BILIRUBIN: NEGATIVE
GLUCOSE: NEGATIVE
KETONE: NEGATIVE
LEUKOCYTES: NEGATIVE
NITRITE: NEGATIVE
PH: 6
SPECIFIC GRAVITY: >=1.03
UROBILINOGEN: 0.2

## 2023-09-14 LAB — URINALYSIS, MICROSCOPIC
BACTERIA: NEGATIVE /[HPF]
NON-SQUAMOUS EPITHELIAL CELLS URINE: <1 /[HPF] (ref <=1)
RBCS: 2 /[HPF] (ref <4)
SQUAMOUS EPITHELIAL: 2 /[HPF] (ref <28)
WBCS: 2 /[HPF] (ref <6)

## 2023-09-14 LAB — POCT RAPID STREP A: RAPID STREP A (POCT): NEGATIVE

## 2023-09-14 MED ORDER — AMOXICILLIN 875 MG-POTASSIUM CLAVULANATE 125 MG TABLET
1.0000 | ORAL_TABLET | Freq: Two times a day (BID) | ORAL | 0 refills | Status: AC
Start: 2023-09-14 — End: 2023-09-24

## 2023-09-14 MED ORDER — AMLODIPINE 5 MG TABLET
7.5000 mg | ORAL_TABLET | Freq: Every day | ORAL | 1 refills | Status: DC
Start: 2023-09-14 — End: 2023-09-27

## 2023-09-14 NOTE — Progress Notes (Signed)
INTERNAL MEDICINE, BUILDING A  [address]  [place] [address]  Operated by [facility]  Progress Note    Name: Christine Mcmillan MRN:  [id number]   Date: 09/14/2023 DOB:  Mar 01, 1946 (77 y.o.)              Chief Complaint: Fall (Pt fell last night the night of 09/13/2023 Francis Dowse said she has pain all over /Her knees hurt /A place above her left eye /And vision is not good )       HPI: Christine Mcmillan is a 77 y.o. female who complains of  fall last night w injury to left side of her head & left knee . Pt states she got up from the coach and was walking to the kitchen when she tripped on the rug. States she landed on her RT side but most of her pain is on the left side. Head hit floor no syncope or LOC noted.  States vision now is blurred in both eyes left > RT however no double vision. +dull headache noted since fall no dizziness however noted.  She has also been having some pain in her anterior chest worse with certain movement but states last night it was worse than this morning.  She denies any radiation of pain diaphoresis nausea vomiting.  Both knees are sore and aching but left-greater-than-right.  She reports she had shots in both knees around 2 weeks ago by the orthopedic but they do not seem to help much.  She has a longstanding history of arthritis in her knees with knee pain.  Reports lower back also hurting possibly from the fall.  She also has a sore throat with some drainage but history of strep so would like prescription for penicillin if possible today.  Patient does have a history of hypertension with blood pressure elevated at 147/88.  Patient states blood pressure has been doing better prior to today's checked.    Allergies:  Allergies   Allergen Reactions    Acetic Acid-Aluminum Acetate Swelling    Sertraline  Other Adverse Reaction (Add comment)     Headache         Current Medications:  acetaminophen-codeine (TYLENOL #3) 300-30 mg Oral Tablet, Take 1 Tablet by mouth  Three times a day  cyclobenzaprine (FLEXERIL) 10 mg Oral Tablet, Take 1 Tablet (10 mg total) by mouth Three times a day  d-mannose 500 mg Oral Capsule, Take 1 Capsule (500 mg total) by mouth Twice daily  ergocalciferol, vitamin D2, (DRISDOL) 1,250 mcg (50,000 unit) Oral Capsule, Take 1 Capsule (50,000 Units total) by mouth Every 7 days  furosemide (LASIX) 40 mg Oral Tablet, Take 1 Tablet (40 mg total) by mouth Once per day as needed for Other (Edema)  LORazepam (ATIVAN) 2 mg Oral Tablet, Take 1 Tablet (2 mg total) by mouth Every night as needed for Anxiety  NALOXONE 4 MG/ACTUATION NASAL SPRAY, 1 Spray by INTRANASAL route Every 2 minutes as needed for actual or suspected opioid overdose. Call 911 if used.  nebivoloL (BYSTOLIC) 10 mg Oral Tablet, TAKE ONE TABLET BY MOUTH ONCE DAILY  nitrofurantoin macrocrystaL (MACRODANTIN) 50 mg Oral Capsule, Take 1 Capsule (50 mg total) by mouth Every night  omeprazole (PRILOSEC) 20 mg Oral Capsule, Delayed Release(E.C.), Take 1 Capsule (20 mg total) by mouth Once a day  pravastatin (PRAVACHOL) 80 mg Oral Tablet, Take 1 Tablet (80 mg total) by mouth Once a day  amLODIPine (NORVASC) 5 mg Oral Tablet, Take 1 Tablet (  5 mg total) by mouth Once a day  rizatriptan (MAXALT-MLT) 5 mg Oral Tablet, Rapid Dissolve, Take 1 Tablet (5 mg total) by mouth Once, as needed May repeat in 2 hours if needed.    No facility-administered medications prior to visit.       Past Medical History:   Diagnosis Date    Abnormal renal function     Anxiety     Arthralgia     Bell's palsy     Chronic hypokalemia     COVID-19 vaccine series completed     Depression     Esophageal reflux     History of memory loss     History of recurrent UTIs     Hypertension            Social History     Tobacco Use    Smoking status: Never    Smokeless tobacco: Never   Substance Use Topics    Alcohol use: Never    Drug use: Never       OBJECTIVE:  Vitals:    09/14/23 1326   BP: (!) 147/88   Pulse: 73   SpO2: 96%   Weight: 71.2 kg  (157 lb)   Height: 1.524 m (5')   BMI: 30.66        Physical Exam  Vitals and nursing note reviewed.   HENT:      Head: Atraumatic.  No bruising hematoma asymmetry noted     Right Ear: Tympanic membrane normal.      Left Ear: Tympanic membrane normal.      Mouth/Throat:  Redness posterior pharynx noted     Mouth: Mucous membranes are moist.     Eyes:      Extraocular Movements: Extraocular movements intact.      Comments: +mild ptosis RT eye still noted from previous Bell's palsy  Cardiovascular:      Rate and Rhythm: Normal rate and regular rhythm.      Pulses: Normal pulses.   Anterior chest slightly tender on palpation  Pulmonary:      Effort: Pulmonary effort is normal.      Breath sounds: Normal breath sounds. No wheezing or rhonchi.   Abdominal:      General: Bowel sounds are normal.      Palpations: Abdomen is soft.      Tenderness: There is no abdominal tenderness. There is no right CVA tenderness or left CVA tenderness.   Musculoskeletal:         General: No swelling. Normal range of motion.      Cervical back: Normal range of motion and neck supple.      Comments: +DJD bilat knees  Left knee > RT w crepitus noted and mild genu varum   Mild swelling left medial compartment  +decreased muscle tone all extrem  Decreased range of motion/flexion lower back due to pain  Gait stable  Skin:     General: Skin is warm.      Findings: No lesion or rash.      Comments: +scattered purpura bilat upper extrem   Neurological:      General: No focal deficit present.      Mental Status: She is alert and oriented to person, place, and time.      Cranial Nerves: No cranial nerve deficit.      Sensory: No sensory deficit.      Motor: No weakness.      Gait: Gait normal.   Psychiatric:  Mood and Affect: Mood normal.         Behavior: Behavior normal.         Thought Content: Thought content normal.         Judgment: Judgment normal.       Urine Dip Results:   Time collected: 1427  Glucose (Ref Range: Negative mg/dL):  Negative  Bilirubin (Ref Range: Negative mg/dL): Negative  Ketones (Ref Range: Negative mg/dL): Negative  Urine Specific Gravity (Ref Range: 1.005 - 1.030): > or equal to 1.030  Blood (urine) (Ref Range: Negative mg/dL): (!) Trace Intact  pH (Ref Range: 5.0 - 8.0): 6.0  Protein (Ref Range: Negative mg/dL): (!) Trace  Urobilinogen (Ref Range: Normal): 0.2mg /dL (Normal)  Nitrite (Ref Range: Negative): Negative  Leukocytes (Ref Range: Negative WBC's/uL): Negative    Strep Neg  Assessment & Plan  Fall  Encourage patient to use cane for stability  Contusion of head, unspecified part of head, initial encounter  Will get stat CT of head  Headache    Vision changes    Chest pain, unspecified type  EKG shows no acute changes  Chest pain prob atypical due to fall  Pain in both knees, unspecified chronicity  Will xray bilat knees  Low back pain  Will xray lumbar spin  Sore throat  Strep neg  Augmentin 875mg  bid x 10 days  Essential hypertension  BP does not seem to be controlled as EKG noted LVH  Will increase Norvasc 7.5mg  qd  Hematuria, unspecified type  Urine sent for culture  Recurrent UTI         PLAN: Treatment per orders . Call or return to clinic prn if these symptoms worsen or fail to improve as anticipated.  Orders Placed This Encounter    CT BRAIN WO IV CONTRAST    XR LUMBAR SPINE AP/OBLIQUES/LAT/SPOT    CANCELED: XR KNEE BILATERAL 3 VIEWS EA    URINALYSIS, MACROSCOPIC AND MICROSCOPIC W/CULTURE REFLEX    URINALYSIS, MACROSCOPIC    URINALYSIS, MICROSCOPIC    URINALYSIS, MACROSCOPIC AND MICROSCOPIC W/CULTURE REFLEX    93000 - ECG In Clinic ([facility])    POCT RAPID STREP A    amLODIPine (NORVASC) 5 mg Oral Tablet    amoxicillin-pot clavulanate (AUGMENTIN) 875-125 mg Oral Tablet      Post-Discharge Follow Up Appointments       Tuesday Sep 27, 2023    Return Patient Visit with Eyvonne Mechanic, PA-C at  2:30 PM      Monday Oct 24, 2023    Return Patient Visit with Eyvonne Mechanic, PA-C at  1:30 PM      Monday Nov 21, 2023     Return Telephone Visit with Eyvonne Mechanic, PA-C at  1:00 PM      Wednesday Nov 23, 2023    Return Patient Visit with Eyvonne Mechanic, PA-C at  1:00 PM      Wednesday Dec 21, 2023    Return Patient Visit with Eyvonne Mechanic, PA-C at  1:00 PM      Wednesday Jan 18, 2024    Return Patient Visit with Eyvonne Mechanic, PA-C at  1:30 PM      Wednesday Feb 15, 2024    Return Patient Visit with Eyvonne Mechanic, PA-C at  1:00 PM      Wednesday Mar 14, 2024    Return Patient Visit with Eyvonne Mechanic, PA-C at  1:00 PM      Wednesday Apr 11, 2024    Return Patient Visit  with Eyvonne Mechanic, PA-C at  2:00 PM      Internal Medicine, Building A  [facility], [place]  [address]  [place] [address]  [phone]             This note was partially created using MModal Fluency Direct system (voice recognition software) and is inherently subject to errors including those of syntax and "sound-alike" substitutions which may escape proofreading.  In such instances, original meaning may be extrapolated by contextual derivation.    Jacobb Alen, PA-C

## 2023-09-14 NOTE — Assessment & Plan Note (Signed)
BP does not seem to be controlled as EKG noted LVH  Will increase Norvasc 7.5mg  qd

## 2023-09-18 NOTE — Result Encounter Note (Signed)
EKG Normal sinus rhythm at 71 beats per minute

## 2023-09-19 ENCOUNTER — Other Ambulatory Visit (INDEPENDENT_AMBULATORY_CARE_PROVIDER_SITE_OTHER): Payer: Self-pay | Admitting: Physician Assistant

## 2023-09-19 ENCOUNTER — Telehealth (INDEPENDENT_AMBULATORY_CARE_PROVIDER_SITE_OTHER): Payer: Self-pay | Admitting: Physician Assistant

## 2023-09-21 ENCOUNTER — Telehealth (INDEPENDENT_AMBULATORY_CARE_PROVIDER_SITE_OTHER): Payer: Self-pay | Admitting: Physician Assistant

## 2023-09-21 NOTE — Telephone Encounter (Signed)
Patient called requesting Prometh with codeine, stating that she is coughing really bad. Patient last had it refilled on 08/29/23.

## 2023-09-26 ENCOUNTER — Ambulatory Visit (INDEPENDENT_AMBULATORY_CARE_PROVIDER_SITE_OTHER): Payer: Self-pay | Admitting: Physician Assistant

## 2023-09-27 ENCOUNTER — Encounter (INDEPENDENT_AMBULATORY_CARE_PROVIDER_SITE_OTHER): Payer: Self-pay | Admitting: Physician Assistant

## 2023-09-27 ENCOUNTER — Ambulatory Visit: Payer: Medicare Other | Attending: Physician Assistant | Admitting: Physician Assistant

## 2023-09-27 ENCOUNTER — Other Ambulatory Visit: Payer: Self-pay

## 2023-09-27 DIAGNOSIS — I10 Essential (primary) hypertension: Secondary | ICD-10-CM

## 2023-09-27 DIAGNOSIS — F32A Depression, unspecified: Secondary | ICD-10-CM

## 2023-09-27 DIAGNOSIS — F419 Anxiety disorder, unspecified: Secondary | ICD-10-CM

## 2023-09-27 DIAGNOSIS — G8929 Other chronic pain: Secondary | ICD-10-CM

## 2023-09-27 DIAGNOSIS — M545 Low back pain, unspecified: Secondary | ICD-10-CM

## 2023-09-27 MED ORDER — LORAZEPAM 2 MG TABLET
2.0000 mg | ORAL_TABLET | Freq: Every evening | ORAL | 2 refills | Status: DC | PRN
Start: 2023-09-27 — End: 2023-11-23

## 2023-09-27 MED ORDER — ERGOCALCIFEROL (VITAMIN D2) 1,250 MCG (50,000 UNIT) CAPSULE
50000.0000 [IU] | ORAL_CAPSULE | ORAL | 2 refills | Status: DC
Start: 2023-09-27 — End: 2023-11-23

## 2023-09-27 MED ORDER — AMLODIPINE 5 MG TABLET
7.5000 mg | ORAL_TABLET | Freq: Every day | ORAL | 1 refills | Status: DC
Start: 2023-09-27 — End: 2023-11-23

## 2023-09-27 MED ORDER — ACETAMINOPHEN 300 MG-CODEINE 30 MG TABLET
1.0000 | ORAL_TABLET | Freq: Three times a day (TID) | ORAL | 0 refills | Status: DC
Start: 2023-09-27 — End: 2023-11-01

## 2023-09-27 NOTE — Progress Notes (Signed)
INTERNAL MEDICINE, [name]  [address]  [place] [address]        Telephone Visit    Name:  Christine Mcmillan MRN: [id number]   Date:  09/27/2023 Age:   77 y.o.     The patient/family initiated a request for telephone service.  Verbal consent for this service was obtained from the patient/family.    TELEMEDICINE DOCUMENTATION:    [facility]    Patient/family aware of provider location:  yes  Patient/family consent for telemedicine:  yes  Examination observed and performed by:  Eyvonne Mechanic, PA-C    Chief Complaint   Patient presents with    Medication Refill     Follow up        Call notes:  Christine Mcmillan is a 77 y.o. female being seen via TeleMed  for f/u  Concerning BP/hypertension for which she states blood pressure has been stable when checked outpatient.  She is currently on Bystolic 10 mg which she tolerates well.  Denies headaches dizziness chest pain.    Follow-up chronic anxiety for which she takes Ativan p.r.n. and needs refills also.  At this time anxiety is stable at current dose and she denies panic attacks or new issues.    Follow-up chronic back pain/osteoarthritis for which patient uses Tylenol 3 p.r.n. pain.  She has a lot of other joint pain including hip knee hands.  Patient states Tylenol 3 does help her pain and she also uses topical Pennsaid as needed.  No side effects or issues with medication noted and refills on med requested.    Patient once again has a history of a chronic cough for which she has been using promethazine with codeine p.r.n..  States the Tylenol 3 has now even seemed to help her cough she has she does not take as much of the cough medication as previously using.  She has been evaluated by pulmonologist in the past with workup unremarkable.        Current meds:     acetaminophen-codeine (TYLENOL #3) 300-30 mg Oral Tablet Take 1 Tablet by mouth Three times a day    amLODIPine (NORVASC) 5 mg Oral Tablet Take 1.5 Tablets (7.5 mg total) by  mouth Once a day    cyclobenzaprine (FLEXERIL) 10 mg Oral Tablet Take 1 Tablet (10 mg total) by mouth Three times a day    d-mannose 500 mg Oral Capsule Take 1 Capsule (500 mg total) by mouth Twice daily    ergocalciferol, vitamin D2, (DRISDOL) 1,250 mcg (50,000 unit) Oral Capsule Take 1 Capsule (50,000 Units total) by mouth Every 7 days    furosemide (LASIX) 40 mg Oral Tablet Take 1 Tablet (40 mg total) by mouth Once per day as needed for Other (Edema)    LORazepam (ATIVAN) 2 mg Oral Tablet Take 1 Tablet (2 mg total) by mouth Every night as needed for Anxiety    NALOXONE 4 MG/ACTUATION NASAL SPRAY 1 Spray by INTRANASAL route Every 2 minutes as needed for actual or suspected opioid overdose. Call 911 if used.    nebivoloL (BYSTOLIC) 10 mg Oral Tablet TAKE ONE TABLET BY MOUTH ONCE DAILY    nitrofurantoin macrocrystaL (MACRODANTIN) 50 mg Oral Capsule Take 1 Capsule (50 mg total) by mouth Every night    omeprazole (PRILOSEC) 20 mg Oral Capsule, Delayed Release(E.C.) Take 1 Capsule (20 mg total) by mouth Once a day    pravastatin (PRAVACHOL) 80 mg Oral Tablet Take 1 Tablet (80 mg total)  by mouth Once a day        Allergies   Allergen Reactions    Acetic Acid-Aluminum Acetate Swelling    Sertraline  Other Adverse Reaction (Add comment)     Headache          Past Medical History:   Diagnosis Date    Abnormal renal function     Anxiety     Arthralgia     Bell's palsy     Chronic hypokalemia     COVID-19 vaccine series completed     Depression     Esophageal reflux     History of memory loss     History of recurrent UTIs     Hypertension         Social History     Socioeconomic History    Marital status: Divorced   Tobacco Use    Smoking status: Never    Smokeless tobacco: Never   Substance and Sexual Activity    Alcohol use: Never    Drug use: Never     Social Determinants of Health     Financial Resource Strain: Low Risk  (07/22/2022)    Financial Resource Strain     SDOH Financial: No   Transportation Needs: Low Risk   (07/22/2022)    Transportation Needs     SDOH Transportation: No   Social Connections: Low Risk  (07/22/2022)    Social Connections     SDOH Social Isolation: 5 or more times a week   Intimate Partner Violence: Low Risk  (07/22/2022)    Intimate Partner Violence     SDOH Domestic Violence: No   Housing Stability: Low Risk  (07/22/2022)    Housing Stability     SDOH Housing Situation: I have housing.     SDOH Housing Worry: No        Past Surgical History:   Procedure Laterality Date    BRAIN TUMOR EXCISION      HX LIPOMA RESECTION N/A     Lipoma on back        Family Medical History:       Problem Relation (Age of Onset)    Cancer Mother             Physical Exam  Constitutional:       Comments: Patient able to relay history today without issue   Pulmonary:      Effort: Pulmonary effort is normal.   Neurological:      Mental Status: She is alert.   Psychiatric:         Mood and Affect: Mood normal.      Assessment & plan:      ICD-10-CM    1. Essential hypertension  I10       2. Anxiety and depression  F41.9     F32.A       3. Chronic low back pain, unspecified back pain laterality, unspecified whether sciatica present  M54.50     G89.29           Med refilled as noted  Will obtain labs at next appointment    Total provider time spent with the patient on the phone: 7 minutes.    Post-Discharge Follow Up Appointments       Monday Oct 24, 2023    Return Patient Visit with Eyvonne Mechanic, PA-C at  1:30 PM      Monday Nov 21, 2023    Return Telephone Visit with Hyman Bible, Batu Cassin, PA-C at  1:00 PM      Wednesday Nov 23, 2023    Return Patient Visit with Eyvonne Mechanic, PA-C at  1:00 PM      Wednesday Dec 21, 2023    Return Patient Visit with Eyvonne Mechanic, PA-C at  1:00 PM      Wednesday Jan 18, 2024    Return Patient Visit with Eyvonne Mechanic, PA-C at  1:30 PM      Wednesday Feb 15, 2024    Return Patient Visit with Eyvonne Mechanic, PA-C at  1:00 PM      Wednesday Mar 14, 2024    Return Patient Visit with Eyvonne Mechanic, PA-C at  1:00 PM      Wednesday  Apr 11, 2024    Return Patient Visit with Eyvonne Mechanic, PA-C at  2:00 PM      Internal Medicine, Building A  [facility], [place]  [address]  [place] [address]  [phone]                This note was partially created using MModal Fluency Direct system (voice recognition software) and is inherently subject to errors including those of syntax and "sound-alike" substitutions which may escape proofreading.  In such instances, original meaning may be extrapolated by contextual derivation.    Dreshon Proffit, PA-C

## 2023-10-18 ENCOUNTER — Other Ambulatory Visit (INDEPENDENT_AMBULATORY_CARE_PROVIDER_SITE_OTHER): Payer: Self-pay | Admitting: Physician Assistant

## 2023-10-24 ENCOUNTER — Ambulatory Visit: Payer: Medicare Other | Admitting: Physician Assistant

## 2023-10-28 ENCOUNTER — Ambulatory Visit (INDEPENDENT_AMBULATORY_CARE_PROVIDER_SITE_OTHER): Payer: Medicare Other | Admitting: Physician Assistant

## 2023-10-28 ENCOUNTER — Other Ambulatory Visit: Payer: Self-pay

## 2023-10-28 ENCOUNTER — Encounter (INDEPENDENT_AMBULATORY_CARE_PROVIDER_SITE_OTHER): Payer: Self-pay | Admitting: Physician Assistant

## 2023-10-28 DIAGNOSIS — K219 Gastro-esophageal reflux disease without esophagitis: Secondary | ICD-10-CM

## 2023-10-28 DIAGNOSIS — E559 Vitamin D deficiency, unspecified: Secondary | ICD-10-CM

## 2023-10-28 DIAGNOSIS — E785 Hyperlipidemia, unspecified: Secondary | ICD-10-CM

## 2023-10-28 DIAGNOSIS — N289 Disorder of kidney and ureter, unspecified: Secondary | ICD-10-CM

## 2023-10-28 DIAGNOSIS — I10 Essential (primary) hypertension: Secondary | ICD-10-CM

## 2023-10-28 DIAGNOSIS — E876 Hypokalemia: Secondary | ICD-10-CM

## 2023-10-28 DIAGNOSIS — R7309 Other abnormal glucose: Secondary | ICD-10-CM

## 2023-10-28 NOTE — Progress Notes (Unsigned)
INTERNAL MEDICINE, [name]  [address]  [place] [address]        Telephone Visit    Name:  Christine Mcmillan MRN: [id number]   Date:  10/28/2023 Age:   78 y.o.     The patient/family initiated a request for telephone service.  Verbal consent for this service was obtained from the patient/family.    TELEMEDICINE DOCUMENTATION:    Patient Location:  {[place] [facility] TELEMED SITE (PATIENT LOCATION):[id number]}    Patient/family aware of provider location:  {YES NO:23040}  Patient/family consent for telemedicine:  {YES NO:23040}  Examination observed and performed by:  {[place] [facility] TELEMED PROVIDERS:[id number]}            Reason for call: ***    Call notes:  ***     acetaminophen-codeine (TYLENOL #3) 300-30 mg Oral Tablet Take 1 Tablet by mouth Three times a day    amLODIPine (NORVASC) 5 mg Oral Tablet Take 1.5 Tablets (7.5 mg total) by mouth Once a day    cyclobenzaprine (FLEXERIL) 10 mg Oral Tablet TAKE ONE TABLET BY MOUTH THREE TIMES DAILY    d-mannose 500 mg Oral Capsule Take 1 Capsule (500 mg total) by mouth Twice daily    ergocalciferol, vitamin D2, (DRISDOL) 1,250 mcg (50,000 unit) Oral Capsule Take 1 Capsule (50,000 Units total) by mouth Every 7 days    furosemide (LASIX) 40 mg Oral Tablet Take 1 Tablet (40 mg total) by mouth Once per day as needed for Other (Edema)    LORazepam (ATIVAN) 2 mg Oral Tablet Take 1 Tablet (2 mg total) by mouth Every night as needed for Anxiety    NALOXONE 4 MG/ACTUATION NASAL SPRAY 1 Spray by INTRANASAL route Every 2 minutes as needed for actual or suspected opioid overdose. Call 911 if used.    nebivoloL (BYSTOLIC) 10 mg Oral Tablet TAKE ONE TABLET BY MOUTH ONCE DAILY    nitrofurantoin macrocrystaL (MACRODANTIN) 50 mg Oral Capsule Take 1 Capsule (50 mg total) by mouth Every night    omeprazole (PRILOSEC) 20 mg Oral Capsule, Delayed Release(E.C.) Take 1 Capsule (20 mg total) by mouth Once a day    pravastatin (PRAVACHOL) 80 mg Oral Tablet Take 1 Tablet (80 mg total) by mouth  Once a day        Allergies   Allergen Reactions    Acetic Acid-Aluminum Acetate Swelling    Sertraline  Other Adverse Reaction (Add comment)     Headache          Past Medical History:   Diagnosis Date    Abnormal renal function     Anxiety     Arthralgia     Bell's palsy     Chronic hypokalemia     COVID-19 vaccine series completed     Depression     Esophageal reflux     History of memory loss     History of recurrent UTIs     Hypertension         Social History     Socioeconomic History    Marital status: Divorced   Tobacco Use    Smoking status: Never    Smokeless tobacco: Never   Substance and Sexual Activity    Alcohol use: Never    Drug use: Never     Social Determinants of Health     Financial Resource Strain: Low Risk  (07/22/2022)    Financial Resource Strain     SDOH Financial: No   Transportation Needs: Low Risk  (  07/22/2022)    Transportation Needs     SDOH Transportation: No   Social Connections: Low Risk  (07/22/2022)    Social Connections     SDOH Social Isolation: 5 or more times a week   Intimate Partner Violence: Low Risk  (07/22/2022)    Intimate Partner Violence     SDOH Domestic Violence: No   Housing Stability: Low Risk  (07/22/2022)    Housing Stability     SDOH Housing Situation: I have housing.     SDOH Housing Worry: No        Past Surgical History:   Procedure Laterality Date    BRAIN TUMOR EXCISION      HX LIPOMA RESECTION N/A     Lipoma on back        Family Medical History:       Problem Relation (Age of Onset)    Cancer Mother               Physical Exam:    Physical Exam        Assessment & Plan  Essential hypertension    Abnormal renal function    Hyperlipidemia, unspecified hyperlipidemia type    Abnormal glucose    Chronic hypokalemia    Chronic GERD    Vitamin D deficiency       Orders Placed This Encounter    CANCELED: CBC/DIFF    CANCELED: COMPREHENSIVE METABOLIC PNL, FASTING    CANCELED: HGA1C (HEMOGLOBIN A1C WITH EST AVG GLUCOSE)    CANCELED: URINALYSIS, MACROSCOPIC AND  MICROSCOPIC W/CULTURE REFLEX    CANCELED: LIPID PANEL    CANCELED: MICROALBUMIN/CREATININE RATIO, URINE, RANDOM    CANCELED: VITAMIN D 25 TOTAL    CANCELED: THYROID STIMULATING HORMONE WITH FREE T4 REFLEX    CANCELED: MAGNESIUM    CANCELED: VITAMIN B12    CANCELED: FOLATE        Total provider time spent with the patient on the phone: *** minutes.    This note was partially created using MModal Fluency Direct system (voice recognition software) and is inherently subject to errors including those of syntax and "sound-alike" substitutions which may escape proofreading.  In such instances, original meaning may be extrapolated by contextual derivation.    Daysy Santini, PA-C

## 2023-11-01 ENCOUNTER — Other Ambulatory Visit (INDEPENDENT_AMBULATORY_CARE_PROVIDER_SITE_OTHER): Payer: Self-pay | Admitting: Physician Assistant

## 2023-11-04 ENCOUNTER — Ambulatory Visit (INDEPENDENT_AMBULATORY_CARE_PROVIDER_SITE_OTHER): Payer: Medicare Other | Admitting: Physician Assistant

## 2023-11-04 ENCOUNTER — Other Ambulatory Visit: Payer: Self-pay

## 2023-11-21 ENCOUNTER — Ambulatory Visit: Payer: Medicare Other | Admitting: Physician Assistant

## 2023-11-21 ENCOUNTER — Other Ambulatory Visit: Payer: Self-pay

## 2023-11-23 ENCOUNTER — Other Ambulatory Visit: Payer: Self-pay

## 2023-11-23 ENCOUNTER — Ambulatory Visit: Payer: Medicare Other | Attending: Physician Assistant | Admitting: Physician Assistant

## 2023-11-23 ENCOUNTER — Encounter (INDEPENDENT_AMBULATORY_CARE_PROVIDER_SITE_OTHER): Payer: Self-pay | Admitting: Physician Assistant

## 2023-11-23 DIAGNOSIS — M545 Low back pain, unspecified: Secondary | ICD-10-CM

## 2023-11-23 DIAGNOSIS — I10 Essential (primary) hypertension: Secondary | ICD-10-CM

## 2023-11-23 DIAGNOSIS — F419 Anxiety disorder, unspecified: Secondary | ICD-10-CM

## 2023-11-23 DIAGNOSIS — F32A Depression, unspecified: Secondary | ICD-10-CM

## 2023-11-23 DIAGNOSIS — M25552 Pain in left hip: Secondary | ICD-10-CM

## 2023-11-23 DIAGNOSIS — M25562 Pain in left knee: Secondary | ICD-10-CM

## 2023-11-23 DIAGNOSIS — M25561 Pain in right knee: Secondary | ICD-10-CM

## 2023-11-23 DIAGNOSIS — G8929 Other chronic pain: Secondary | ICD-10-CM

## 2023-11-23 DIAGNOSIS — M25551 Pain in right hip: Secondary | ICD-10-CM

## 2023-11-23 MED ORDER — PRAVASTATIN 80 MG TABLET
80.0000 mg | ORAL_TABLET | Freq: Every day | ORAL | 1 refills | Status: DC
Start: 2023-11-23 — End: 2023-12-21

## 2023-11-23 MED ORDER — LORAZEPAM 2 MG TABLET
2.0000 mg | ORAL_TABLET | Freq: Every evening | ORAL | 2 refills | Status: DC | PRN
Start: 2023-11-23 — End: 2023-12-21

## 2023-11-23 MED ORDER — FUROSEMIDE 40 MG TABLET
40.0000 mg | ORAL_TABLET | Freq: Every day | ORAL | 1 refills | Status: DC | PRN
Start: 2023-11-23 — End: 2023-12-21

## 2023-11-23 MED ORDER — ERGOCALCIFEROL (VITAMIN D2) 1,250 MCG (50,000 UNIT) CAPSULE
50000.0000 [IU] | ORAL_CAPSULE | ORAL | 2 refills | Status: DC
Start: 2023-11-23 — End: 2024-05-17

## 2023-11-23 MED ORDER — NEBIVOLOL 10 MG TABLET
10.0000 mg | ORAL_TABLET | Freq: Every day | ORAL | 1 refills | Status: DC
Start: 2023-11-23 — End: 2024-01-18

## 2023-11-23 MED ORDER — AMLODIPINE 5 MG TABLET
7.5000 mg | ORAL_TABLET | Freq: Every day | ORAL | 1 refills | Status: DC
Start: 2023-11-23 — End: 2024-05-17

## 2023-11-23 MED ORDER — ACETAMINOPHEN 300 MG-CODEINE 30 MG TABLET
1.0000 | ORAL_TABLET | Freq: Three times a day (TID) | ORAL | 0 refills | Status: DC
Start: 2023-11-23 — End: 2023-12-21

## 2023-11-23 NOTE — Progress Notes (Signed)
INTERNAL MEDICINE, [name]  [address]  [place] [address]        Telephone Visit    Name:  Christine Mcmillan MRN: [id number]   Date:  11/23/2023 Age:   78 y.o.     The patient/family initiated a request for telephone service.  Verbal consent for this service was obtained from the patient/family.    TELEMEDICINE DOCUMENTATION:    [facility]    Patient/family aware of provider location:  yes  Patient/family consent for telemedicine:  yes  Examination observed and performed by:  Eyvonne Mechanic, PA-C    Chief Complaint   Patient presents with    Medication Refill     Follow up        Call notes:  Christine Mcmillan is a 78 y.o. female being seen via TeleMed  her her request for f/u  Concerning BP/hypertension for which she has been feeling well but she has not been checking the blood pressure home lately.  She is currently on Bystolic 10 mg daily along with Norvasc 7.5 mg daily which she tolerates well.  Denies headaches dizziness chest pain.    Follow-up chronic anxiety for which she takes Ativan p.r.n. and needs refills also.  At this time anxiety is stable at current dose and she denies panic attacks or new issues.    Follow-up chronic back pain/osteoarthritis for which patient uses Tylenol 3 p.r.n. pain.  She has a lot of other joint pain including hip knee hands.  Patient states Tylenol 3 does help her pain and she also uses topical Pennsaid as needed.  No side effects or issues with medication noted and refills on med requested.    She also needs several other medications refilled today.    Current meds:     acetaminophen-codeine (TYLENOL #3) 300-30 mg Oral Tablet Take 1 Tablet by mouth Three times a day    amLODIPine (NORVASC) 5 mg Oral Tablet Take 1.5 Tablets (7.5 mg total) by mouth Once a day    cyclobenzaprine (FLEXERIL) 10 mg Oral Tablet TAKE ONE TABLET BY MOUTH THREE TIMES DAILY    d-mannose 500 mg Oral Capsule Take 1 Capsule (500 mg total) by mouth Twice daily     ergocalciferol, vitamin D2, (DRISDOL) 1,250 mcg (50,000 unit) Oral Capsule Take 1 Capsule (50,000 Units total) by mouth Every 7 days    furosemide (LASIX) 40 mg Oral Tablet Take 1 Tablet (40 mg total) by mouth Once per day as needed for Other (Edema)    LORazepam (ATIVAN) 2 mg Oral Tablet Take 1 Tablet (2 mg total) by mouth Every night as needed for Anxiety    NALOXONE 4 MG/ACTUATION NASAL SPRAY 1 Spray by INTRANASAL route Every 2 minutes as needed for actual or suspected opioid overdose. Call 911 if used.    nebivoloL (BYSTOLIC) 10 mg Oral Tablet Take 1 Tablet (10 mg total) by mouth Once a day    nitrofurantoin macrocrystaL (MACRODANTIN) 50 mg Oral Capsule Take 1 Capsule (50 mg total) by mouth Every night    omeprazole (PRILOSEC) 20 mg Oral Capsule, Delayed Release(E.C.) Take 1 Capsule (20 mg total) by mouth Once a day    pravastatin (PRAVACHOL) 80 mg Oral Tablet Take 1 Tablet (80 mg total) by mouth Once a day        Allergies   Allergen Reactions    Acetic Acid-Aluminum Acetate Swelling    Sertraline  Other Adverse Reaction (Add comment)     Headache  Past Medical History:   Diagnosis Date    Abnormal renal function     Anxiety     Arthralgia     Bell's palsy     Chronic hypokalemia     COVID-19 vaccine series completed     Depression     Esophageal reflux     History of memory loss     History of recurrent UTIs     Hypertension         Social History     Socioeconomic History    Marital status: Divorced   Tobacco Use    Smoking status: Never    Smokeless tobacco: Never   Substance and Sexual Activity    Alcohol use: Never    Drug use: Never     Social Determinants of Health     Financial Resource Strain: Low Risk  (07/22/2022)    Financial Resource Strain     SDOH Financial: No   Transportation Needs: Low Risk  (07/22/2022)    Transportation Needs     SDOH Transportation: No   Social Connections: Low Risk  (07/22/2022)    Social Connections     SDOH Social Isolation: 5 or more times a week   Intimate Partner  Violence: Low Risk  (07/22/2022)    Intimate Partner Violence     SDOH Domestic Violence: No   Housing Stability: Low Risk  (07/22/2022)    Housing Stability     SDOH Housing Situation: I have housing.     SDOH Housing Worry: No        Past Surgical History:   Procedure Laterality Date    BRAIN TUMOR EXCISION      HX LIPOMA RESECTION N/A     Lipoma on back        Family Medical History:       Problem Relation (Age of Onset)    Cancer Mother             Physical Exam  Constitutional:       Comments: Patient able to relay history today without issue   Pulmonary:      Effort: Pulmonary effort is normal.   Neurological:      Mental Status: She is alert.   Psychiatric:         Mood and Affect: Mood normal.      Assessment & plan:      ICD-10-CM    1. Essential hypertension  I10       2. Anxiety and depression  F41.9     F32.A       3. Chronic low back pain, unspecified back pain laterality, unspecified whether sciatica present  M54.50     G89.29       4. Chronic arthralgias of knees and hips  M25.551     G89.29     M25.561     M25.552     M25.562             Med refilled as noted  Will obtain labs at next appointment    Total provider time spent with the patient on the phone: 21 minutes.    Post-Discharge Follow Up Appointments       Monday Nov 28, 2023    Return Telephone Visit with Eyvonne Mechanic, PA-C at  1:15 PM      Wednesday Dec 21, 2023    Return Patient Visit with Eyvonne Mechanic, PA-C at  1:00 PM      Wednesday Jan 18, 2024    Return Patient Visit with Eyvonne Mechanic, PA-C at  1:30 PM      Monday Feb 20, 2024    Return Patient Visit with Eyvonne Mechanic, PA-C at  2:00 PM      Wednesday Mar 14, 2024    Return Patient Visit with Eyvonne Mechanic, PA-C at  1:00 PM      Wednesday Apr 11, 2024    Return Patient Visit with Eyvonne Mechanic, PA-C at  2:00 PM      Internal Medicine, [facility]  [facility], [place]  [address]  [place] [address]  [phone]             This note was partially created using MModal Fluency Direct system  (voice recognition software) and is inherently subject to errors including those of syntax and "sound-alike" substitutions which may escape proofreading.  In such instances, original meaning may be extrapolated by contextual derivation.    Pastor Sgro, PA-C

## 2023-11-28 ENCOUNTER — Encounter (INDEPENDENT_AMBULATORY_CARE_PROVIDER_SITE_OTHER): Payer: Self-pay | Admitting: Physician Assistant

## 2023-11-28 ENCOUNTER — Ambulatory Visit: Payer: Medicare Other | Attending: Physician Assistant | Admitting: Physician Assistant

## 2023-11-28 ENCOUNTER — Other Ambulatory Visit: Payer: Self-pay

## 2023-11-28 DIAGNOSIS — Z0000 Encounter for general adult medical examination without abnormal findings: Secondary | ICD-10-CM

## 2023-11-28 NOTE — Nursing Note (Signed)
Patient completed Medicare Wellness exam today.

## 2023-11-28 NOTE — Progress Notes (Signed)
[facility] MEDICINE, [facility]  [address]  [place] [address]  Operated by [facility]  Medicare Annual Wellness Visit    Name: Christine Mcmillan MRN:  [id number]   Date: 11/28/2023 Age: 78 y.o.       SUBJECTIVE:   Christine Mcmillan is a 78 y.o. female for presenting for Medicare Wellness exam.   I have reviewed and reconciled the medication list with the patient today.    TELEMEDICINE DOCUMENTATION:    [facility]    Patient/family aware of provider location:  yes  Patient/family consent for telemedicine:  yes  Examination observed and performed by:  Eyvonne Mechanic, PA-C         11/28/2023     1:09 PM 11/17/2022     2:01 PM   Comprehensive Health Assessment-Adult   Do you wish to complete this form? Yes Yes   During the past 4 weeks, how would you rate your health in general? Poor Fair   During the past 4 weeks, how much difficulty have you had doing your usual activities inside and outside your home because of medical or emotional problems? No difficulty at all Much difficulty   During the past 4 weeks, was someone available to help you if you needed and wanted help? Yes, as much as I wanted Yes, some   In the past year, how many times have you gone to the emergency department or been admitted to a hospital for a health problem? 2-4 times 5 times or more   Are you generally satisfied with your sleep? No No   Do you have enough money to buy things you need in everyday life, such as food, clothing, medicines, and housing? Sometimes Yes, always   Can you get to places beyond walking distance without help?  (For example, can you drive your own car or travel alone on buses)? Yes Yes   Do you fasten your seatbelt when you are in a car? Yes, usually Yes, usually   Do you exercise 20 minutes 3 or more days per week (such as walking, dancing, biking, mowing grass, swimming)? No, I usually don't exercise this much No, I usually don't exercise this much   How often do you eat food that is  healthy (fruits, vegetables, lean meats) instead of unhealthy (sweets, fast food, junk food, fatty foods)? Some of the time Most of the time   How often do you have trouble taking medicines the eay you are told to take them? I always take them as prescribed I always take them as prescribed   Do you need any help communicating with your doctors and nurses because of vision or hearing problems? No No   During the past 12 months, have you experienced confusion or memory loss that is happening more often or is getting worse? No Possibly, I am not sure   Do you have one person you think of as your personal doctor (primary care provider or family doctor)? Yes Yes   If you are seeing a Primary Care Provider (PCP) or family doctor. please list their name Eyvonne Mechanic, PA-C Corlette Ciano   Are you now also seeing any specialist physician(s) (such as eye doctor, foot doctor, skin doctor)? Yes Yes   If you are seeing a specialist for anything such as foot, eye, skin, etc.  please list their name(s) Dr. Hessie Knows- Eye; Dr. Razzaq-Neurologist Dr. Darnelle Maffucci   How confident are you that you can control or manage most  of your health problems? Very confident Very confident       I have reviewed and updated as appropriate the past medical, family and social history. 11/28/2023 as summarized below:  Past Medical History:   Diagnosis Date    Abnormal renal function     Anxiety     Arthralgia     Bell's palsy     Chronic hypokalemia     COVID-19 vaccine series completed     Depression     Esophageal reflux     History of memory loss     History of recurrent UTIs     Hypertension      Past Surgical History:   Procedure Laterality Date    Brain tumor excision      Hx lipoma resection N/A      Current Outpatient Medications   Medication Sig    acetaminophen-codeine (TYLENOL #3) 300-30 mg Oral Tablet Take 1 Tablet by mouth Three times a day    amLODIPine (NORVASC) 5 mg Oral Tablet Take 1.5 Tablets (7.5 mg total) by mouth Once a day     cyclobenzaprine (FLEXERIL) 10 mg Oral Tablet TAKE ONE TABLET BY MOUTH THREE TIMES DAILY    d-mannose 500 mg Oral Capsule Take 1 Capsule (500 mg total) by mouth Twice daily    ergocalciferol, vitamin D2, (DRISDOL) 1,250 mcg (50,000 unit) Oral Capsule Take 1 Capsule (50,000 Units total) by mouth Every 7 days    furosemide (LASIX) 40 mg Oral Tablet Take 1 Tablet (40 mg total) by mouth Once per day as needed for Other (Edema)    LORazepam (ATIVAN) 2 mg Oral Tablet Take 1 Tablet (2 mg total) by mouth Every night as needed for Anxiety    NALOXONE 4 MG/ACTUATION NASAL SPRAY 1 Spray by INTRANASAL route Every 2 minutes as needed for actual or suspected opioid overdose. Call 911 if used.    nebivoloL (BYSTOLIC) 10 mg Oral Tablet Take 1 Tablet (10 mg total) by mouth Once a day    nitrofurantoin macrocrystaL (MACRODANTIN) 50 mg Oral Capsule Take 1 Capsule (50 mg total) by mouth Every night    omeprazole (PRILOSEC) 20 mg Oral Capsule, Delayed Release(E.C.) Take 1 Capsule (20 mg total) by mouth Once a day    pravastatin (PRAVACHOL) 80 mg Oral Tablet Take 1 Tablet (80 mg total) by mouth Once a day     Family Medical History:       Problem Relation (Age of Onset)    Cancer Mother            Social History     Socioeconomic History    Marital status: Divorced   Tobacco Use    Smoking status: Never    Smokeless tobacco: Never   Substance and Sexual Activity    Alcohol use: Never    Drug use: Never     Social Determinants of Health     Financial Resource Strain: Low Risk  (07/22/2022)    Financial Resource Strain     SDOH Financial: No   Transportation Needs: Low Risk  (07/22/2022)    Transportation Needs     SDOH Transportation: No   Social Connections: Low Risk  (07/22/2022)    Social Connections     SDOH Social Isolation: 5 or more times a week   Intimate Partner Violence: Low Risk  (07/22/2022)    Intimate Partner Violence     SDOH Domestic Violence: No   Housing Stability: Low Risk  (07/22/2022)    Housing  Stability     SDOH Housing  Situation: I have housing.     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   Care Team       PCP       Name Type Specialty Phone Number    [place], [place], [place] Physician Assistant PHYSICIAN ASSISTANT [phone]              Care Team       No care team found                      Health Maintenance   Topic Date Due    RSV Adult 60+ or Pregnancy (1 - 1-dose 75+ series) 09/26/2024 (Originally 11/11/2019)    Pneumococcal Vaccination, Age 42+ (1 of 2 - PCV) 10/27/2024 (Originally 11/11/1964)    Osteoporosis screening  09/21/2024    Medicare Annual Wellness Visit  11/27/2024    Adult Tdap-Td (2 - Td or Tdap) 01/10/2031    Hepatitis C screening  Discontinued    Influenza Vaccine  Discontinued    Shingles Vaccine  Discontinued    Covid-19 Vaccine  Discontinued     Medicare Wellness Assessment   Medicare initial or wellness physical in the last year?: Yes  Advance Directives   Does patient have a living will or MPOA: No           Advance directive information given to the patient today?: Patient Declined      Activities of Daily Living   Do you need help with dressing, bathing, or walking?: No   Do you need help with shopping, housekeeping, medications, or finances?: No   Do you have rugs in hallways, broken steps, or poor lighting?: No   Do you have grab bars in your bathroom, non-slip strips in your tub, and hand rails on your stairs?: Yes   Cognitive Function Screen (1=Yes, 0=No)   What is you age?: Correct   What is the time to the nearest hour?: Correct   What is the year?: Correct   What is the name of this clinic?: Correct   Can the patient recognize two persons (the doctor, the nurse, home help, etc.)?: Correct   What is the date of your birth? (day and month sufficient) : Correct   In what year did World  War II end?: Incorrect   Who is the current president of the [place]?: Correct   Count from 20 down to 1?: Correct   What address did I give you earlier?: Correct   Total Score: 9   Interpretation of Total Score: Greater than 6 Normal   Fall Risk Screen   Do you feel unsteady when standing or walking?: Yes  Do you worry about falling?: Yes  Have you fallen in the past year?: Yes  How many times have you fallen?: 2 or more times  Were you ever injured from falling?: No   Depression Screen     Little interest or pleasure in doing things.: Not at all  Feeling down, depressed, or hopeless: Not at all  PHQ 2 Total: 0     Pain Score   Pain Score:   9  Substance Use-Abuse Screening     Tobacco Use     In Past 12 MONTHS, how often have you used any tobacco product (for example, cigarettes, e-cigarettes, cigars, pipes, or smokeless tobacco)?: Never     Alcohol use     In the PAST 12 MONTHS, how often have you had 5 (men)/4 (women) or more drinks containing alcohol in one day?: Never     Prescription Drug Use     In the PAST 12 months, how often have you used any prescription medications just for the feeling, more than prescribed, or that were not prescribed for you? Prescriptions may include: opioids, benzodiazepines, medications for ADHD: Never           Illicit Drug Use   In the PAST 12 MONTHS, how often have you used any drugs, including marijuana, cocaine or crack, heroin, methamphetamine, hallucinogens, ecstasy/MDMA?: Never        Hearing Screen   Have you noticed any hearing difficulties?: No  After whispering 9-1-6 how many numbers did the patient repeat correctly?: 3       Vision Screen             Urine Incontinence Screen   Urinary Incontinence Screen  Do you ever leak urine when you don't want to?: YES                      OBJECTIVE:   There were no vitals taken for this visit.       Other appropriate exam:    There are no preventive care reminders to display for this patient.     ASSESSMENT & PLAN:    Assessment/Plan   1. Medicare annual wellness visit, subsequent       Identified Risk Factors/ Recommended Actions     Fall Risk Follow up plan of care: Footwear and potential problems addressed  The PHQ 2 Total: 0 depression screen is interpreted as negative.    Urinary Incontinence Plan of Care: Behavioral interventions with bladder training, pelvic floor muscle training, and/or prompted voiding  Opioid use plan of care:         Opioids use: Plan: Assessment of pain completed and pain controlled  Continue Tylenol No. 3 p.r.n.    Patient declined Advanced Directives information.        No orders of the defined types were placed in this encounter.         The patient has been educated about risk factors and recommended preventive care. Written Prevention Plan completed/ updated and given to patient (see After Visit Summary).    Post-Discharge Follow Up Appointments       Wednesday Dec 21, 2023    Return Patient Visit with Eyvonne Mechanic, PA-C at  1:00 PM      Wednesday Jan 18, 2024    Return Patient Visit with Eyvonne Mechanic, PA-C at  1:30 PM      Monday Feb 20, 2024    Return Patient Visit with Eyvonne Mechanic, PA-C at  2:00 PM      Wednesday Mar 14, 2024    Return Patient Visit with Eyvonne Mechanic, PA-C at  1:00 PM      Wednesday Apr 11, 2024    Return Patient Visit with Eyvonne Mechanic, PA-C at  2:00 PM      Wednesday Nov 28, 2024    Return Telephone Visit with Eyvonne Mechanic, PA-C at  1:00 PM      Internal Medicine, [facility]  [facility], [place]  [address]  [place] [address]  [phone]           Medicare Preventive Services  Medicare coverage information Recommendation for YOU   Heart Disease and Diabetes   Lipid profile Every 5 years or more often if at risk for cardiovascular disease  Last Lipid Panel  (Last result in the past 2 years)        Cholesterol   HDL   LDL   Direct LDL   Triglycerides      03/31/23 1529 153   46   61     229             Diabetes Screening  yearly for those at risk for diabetes, 2 tests  per year for those with prediabetes Last Glucose: 104    Diabetes Self Management Training or Medical Nutrition Therapy  For those with diabetes, up to 10 hrs initial training within a year, subsequent years up to 2 hrs of follow up training Optional for those with diabetes     Medical Nutrition Therapy Three hours of one-on-one counseling in first year, two hours in subsequent years Optional for those with diabetes, kidney disease   Intensive Behavioral Therapy for Obesity  Face-to-face counseling, first month every week, month 2-6 every other week, month 7-12 every month if continued progress is documented Optional for those with Body Mass Index 30 or higher  Your There is no height or weight on file to calculate BMI.   Tobacco Cessation (Quitting) Counseling   Two attempts per year, max 4 sessions per attempt, up to 8 per year, for those with tobacco-related health condition Optional for those that use tobacco   Cancer Screening   Colorectal screening   For anyone age 71 to 9 or any age if high risk:  Screening Colonoscopy every 10 yrs if low risk,  more frequent if higher risk  OR  Cologuard Stool DNA test once every 3 years OR  Fecal Occult Blood Testing yearly OR  Flexible  Sigmoidoscopy  every 5 yr OR  CT Colonography every 5 yrs    See your schedule below   Screening Pap Test Recommended every 3 years for all women age 40 to 59, or every five years if combined with HPV test (routine screening not needed after total hysterectomy).  Medicare covers every 2 years, up to yearly if high risk.  Screening Pelvic Exam Medicare covers every 2 years, yearly if high risk or childbearing age with abnormal Pap in last 3 yrs. See your schedule below   Screening Mammogram   Recommended every 2 years for women age 83 to 76, or more frequent if you have a higher risk. Selectively recommended for women between 40-49 based on shared decisions about risk. Covered by Medicare up to every year for women age 87 or older See your  schedule below      Lung Cancer Screening  Annual low dose computed tomography (LDCT scan) is recommended for those age 39-77 who smoked 20 pack-years and are current smokers or quit smoking within past 15 years (one pack-year= smoking one PPD for one year), after counseling by your doctor or nurse clinician about the possible benefits or harms. See your schedule below   Vaccinations   Pneumococcal Vaccine: Recommended routinely age 6+ with one or two separate vaccines based on your risk    Recommended before age 18 if medical conditions with increased risk  Seasonal Influenza Vaccine: Once every  flu season   Hepatitis B Vaccine: 3 doses if risk (including anyone with diabetes or liver disease)  Shingles Vaccine: Two doses at age 51 or older  Diphtheria Tetanus Pertussis Vaccine: ONCE as adult, booster every 10 years     Immunization History   Administered Date(s) Administered    Covid-19 Vaccine,Moderna,12 Years+ 08/11/2020, 09/08/2020    Tetanus Toxoid/Diphtheria Toxoid/Acellular Pertussis Vaccine, Adsorbed 01/09/2021     Shingles vaccine and Diphtheria Tetanus Pertussis vaccines are available at pharmacies or local health department without a prescription.   Other Screening   Bone Densitometry   Every 24 months for anyone at risk, including postmenopausal       Glaucoma Screening   Yearly if in high risk group such as diabetes, family history, African American age 1+ or Hispanic American age 23+   See your Eye Care Provider   Hepatitis C Screening recommended ONCE for those born between 1945-1965, or high risk for HCV infection       HIV Testing recommended routinely at least ONCE, covered every year for age 57 to 65 regardless of risk, and every year for age over 68 who ask for the test or higher risk  Yearly or up to 3 times in pregnancy         Abdominal Aortic Aneurysm Screening Ultrasound   Once between the age of 16-75 with a family history of AAA       Your Personalized Schedule for Preventive Tests      Health Maintenance: Pending and Last Completed         Date Due Completion Date    RSV Adult 60+ or Pregnancy (1 - 1-dose 75+ series) 09/26/2024 (Originally 11/11/2019) ---    Pneumococcal Vaccination, Age 59+ (1 of 2 - PCV) 10/27/2024 (Originally 11/11/1964) ---    Osteoporosis screening 09/21/2024 ---    Medicare Annual Wellness Visit 11/27/2024 11/28/2023    Adult Tdap-Td (2 - Td or Tdap) 01/10/2031 01/09/2021               Non-Opioid Treatment for Chronic Pains     Treatment for chronic pain can be managed without opioids. Below are non-opioid options that may be considered and discussed with your provider to determine which options would be best for your health.    Over the counter or presciptions medications:  Acetaminophen (Tylenol) or Non-steroidal anti-inflammatories such as: Ibuprofen (Motrin, Advil), naproxen (Aleve), aspirin  Antidepressants such as amitriptyline, nortriptyline (Pamelor),  Doxepin (Silenor), Imipramine (Tofranil) and others.  Anticonvulsant Nerve pain medications: Gabapentin (Neurontin), pregabalin (Lyrica)  Externally applied medications such as NSAID'S, lidocaine, capsaisin, and others  Injections: pain specialists can sometimes inject medications at the site of pain.    Alternative therapies such as  Acupuncture  Osteopathic manipulation  Chiropractic  Massage therapy         Sargun Rummell B. Luis Sami PA-C

## 2023-11-28 NOTE — Patient Instructions (Signed)
Medicare Preventive Services  Medicare coverage information Recommendation for YOU   Heart Disease and Diabetes   Lipid profile Every 5 years or more often if at risk for cardiovascular disease     Lab Results   Component Value Date    CHOLESTEROL 153 03/31/2023    HDLCHOL 46 03/31/2023    LDLCHOL 61 03/31/2023    TRIG 229 (H) 03/31/2023           Diabetes Screening    Yearly for those at risk for diabetes, 2 tests per year for those with prediabetes Last Glucose: 104    Diabetes Self Management Training or Medical Nutrition Therapy  For those with diabetes, up to 10 hrs initial training within a year, subsequent years up to 2 hrs of follow up training Optional for those with diabetes     Medical Nutrition Therapy  Three hours of one-on-one counseling in first year, two hours in subsequent years Optional for those with diabetes, kidney disease   Intensive Behavioral Therapy for Obesity  Face-to-face counseling, first month every week, month 2-6 every other week, month 7-12 every month if continued progress is documented Optional for those with Body Mass Index 30 or higher  Your There is no height or weight on file to calculate BMI.   Tobacco Cessation (Quitting) Counseling   Covers up to 8 smoking and tobacco-use cessation counseling sessions in a 84-month period.    Optional for those that use tobacco   Cancer Screening Last Completion Date   Colorectal screening   For anyone age 33 to 72 or any age if high risk:  Screening Colonoscopy every 10 yrs if low risk,  more frequent if higher risk  OR  Cologuard Stool DNA test once every 3 years OR  Fecal Occult Blood Testing yearly OR  Flexible  Sigmoidoscopy  every 5 yr OR  CT Colonography every 5 yrs      See below for due date if applicable.   Screening Pap Test   Recommended every 3 years for all women age 79 to 32, or every five years if combined with HPV test (routine screening not needed after total hysterectomy).  Medicare covers every 2 years or yearly if high  risk.  Screening Pelvic Exam   Medicare covers every 2 years, yearly if high risk or childbearing age with abnormal Pap in last 3 yrs.     See below for due date if applicable.   Screening Mammogram   Recommended every 2 years for women age 68 to 44, or more frequent if you have a higher risk. Selectively recommended for women between 79-49 based on shared decisions about risk. Covered by Medicare up to every year for women age 79 or older   See below for due date if applicable.         Lung Cancer Screening  Annual low dose computed tomography (LDCT scan) is recommended for those age 56-80 who smoked 20 pack-years and are current smokers or quit smoking within past 15 years, after counseling by your doctor or nurse clinician about the possible benefits or harms.     See below for due date if applicable.   Vaccinations   Respiratory syncytial virus (RSV)  Age 61 years or older: Based on shared clinical decision-making with your provider.  Pneumococcal Vaccine  Recommended routinely age 79+ with one or two separate vaccines based on your risk. Recommended before age 44 if medical conditions with increased risk  Seasonal Influenza Vaccine  Once  every flu season   Hepatitis B Vaccine  3 doses if risk (including anyone with diabetes or liver disease)  Shingles Vaccine  Two doses at age 79 or older  Diphtheria Tetanus Pertussis Vaccine  ONCE as adult, booster every 10 years     Immunization History   Administered Date(s) Administered   . Covid-19 Vaccine,Moderna,12 Years+ 08/11/2020, 09/08/2020   . Tetanus Toxoid/Diphtheria Toxoid/Acellular Pertussis Vaccine, Adsorbed 01/09/2021     Shingles vaccine and Diphtheria Tetanus Pertussis vaccines are available at pharmacies or local health department without a prescription.   Other Preventative Screening  Last Completion Date   Bone Densitometry   Screening: All females ages 4 and older every 10 years if initial screening normal. Postmenopausal women ages 70-64 need screening  with one or more risk factor: previous fracture, parental hip fracture, current smoker, low body weight, excessive alcohol use, Rheumatoid Arthritis   For women with diagnosed Osteoporosis, follow up is recommended every 2 years or a frequency recommended by your provider.       See below for due date if applicable.     Glaucoma Screening   Yearly if in high risk group such as diabetes, family history, African American age 79+ or Hispanic American age 88+   See your eye care provider for screening.   Hepatitis C Screening   Recommended  for those born between ages 18-79 years.     See below for due date if applicable.     HIV Testing  Recommended routinely at least ONCE, covered every year for age 2 to 63 regardless of risk, and every year for age over 9 who ask for the test or higher risk. Yearly or up to 3 times in pregnancy         See below for due date if applicable.   Abdominal Aortic Aneurysm Screening Ultrasound   Once with a family history of abdominal aortic aneurysms OR a female between65-75 and have smoked at least 100 cigarettes in your lifetime.         See below for due date if applicable.       Your Personalized Schedule for Preventive Tests   Health Maintenance: Pending and Last Completed       Date Due Completion Date    RSV Adult 60+ or Pregnancy (1 - 1-dose 75+ series) 09/26/2024 (Originally 11/11/2019) ---    Pneumococcal Vaccination, Age 40+ (1 of 2 - PCV) 10/27/2024 (Originally 11/11/1964) ---    Osteoporosis screening 09/21/2024 ---    Medicare Annual Wellness Visit 11/27/2024 11/28/2023    Adult Tdap-Td (2 - Td or Tdap) 01/10/2031 01/09/2021             Non-Opioid Treatment for Chronic Pains   Treatment for chronic pain can be managed without opioids. Below are non-opioid options that may be considered and discussed with your provider to determine which options would be best for your health.    Over the counter or presciptions medications:  Acetaminophen (Tylenol) or Non-steroidal  anti-inflammatories such as: Ibuprofen (Motrin, Advil), naproxen (Aleve), aspirin  Antidepressants such as amitriptyline, nortriptyline (Pamelor),  Doxepin (Silenor), Imipramine (Tofranil) and others.  Anticonvulsant Nerve pain medications: Gabapentin (Neurontin), pregabalin (Lyrica)  Externally applied medications such as NSAID'S, lidocaine, capsaisin, and others  Injections: pain specialists can sometimes inject medications at the site of pain.    Alternative therapies such as  Acupuncture  Osteopathic manipulation  Chiropractic  Massage therapy       For Information on Advanced Directives for  Health Care:  :  [email or web address]  [place], [place], [place], [place] General Information: [email or web address]

## 2023-11-29 ENCOUNTER — Telehealth (INDEPENDENT_AMBULATORY_CARE_PROVIDER_SITE_OTHER): Payer: Self-pay | Admitting: Physician Assistant

## 2023-11-29 ENCOUNTER — Other Ambulatory Visit (INDEPENDENT_AMBULATORY_CARE_PROVIDER_SITE_OTHER): Payer: Self-pay | Admitting: Physician Assistant

## 2023-11-29 MED ORDER — OMEPRAZOLE 20 MG CAPSULE,DELAYED RELEASE
20.0000 mg | DELAYED_RELEASE_CAPSULE | Freq: Every day | ORAL | 1 refills | Status: DC
Start: 2023-11-29 — End: 2024-03-20

## 2023-11-29 NOTE — Telephone Encounter (Signed)
Patient called asking for a prescription for promethazine with codeine due to having a cough with so much mucus production that she states she is almost vomiting.

## 2023-11-30 MED ORDER — PROMETHAZINE-DM 6.25 MG-15 MG/5 ML ORAL SYRUP
5.0000 mL | ORAL_SOLUTION | Freq: Four times a day (QID) | ORAL | 0 refills | Status: AC | PRN
Start: 2023-11-30 — End: 2023-12-07

## 2023-12-19 NOTE — Assessment & Plan Note (Signed)
 CONTINUE TYLENOL #3 PRN - REFILLS GIVEN

## 2023-12-19 NOTE — Assessment & Plan Note (Signed)
 CONTINUE PROMETH DM PRN ALONG W PULM F/U

## 2023-12-19 NOTE — Assessment & Plan Note (Signed)
 STABLE ON CURRENT MEDS

## 2023-12-19 NOTE — Assessment & Plan Note (Signed)
 CONTINUE ATIVAN AS DIRECTED- REFILLS GIVEN

## 2023-12-21 ENCOUNTER — Other Ambulatory Visit: Payer: Self-pay

## 2023-12-21 ENCOUNTER — Ambulatory Visit: Payer: Medicare Other | Attending: Physician Assistant | Admitting: Physician Assistant

## 2023-12-21 ENCOUNTER — Encounter (INDEPENDENT_AMBULATORY_CARE_PROVIDER_SITE_OTHER): Payer: Self-pay | Admitting: Physician Assistant

## 2023-12-21 VITALS — BP 118/80 | HR 69 | Ht 60.0 in | Wt 155.0 lb

## 2023-12-21 DIAGNOSIS — N289 Disorder of kidney and ureter, unspecified: Secondary | ICD-10-CM | POA: Insufficient documentation

## 2023-12-21 DIAGNOSIS — E559 Vitamin D deficiency, unspecified: Secondary | ICD-10-CM | POA: Insufficient documentation

## 2023-12-21 DIAGNOSIS — M25552 Pain in left hip: Secondary | ICD-10-CM | POA: Insufficient documentation

## 2023-12-21 DIAGNOSIS — M545 Low back pain, unspecified: Secondary | ICD-10-CM | POA: Insufficient documentation

## 2023-12-21 DIAGNOSIS — R7303 Prediabetes: Secondary | ICD-10-CM | POA: Insufficient documentation

## 2023-12-21 DIAGNOSIS — E876 Hypokalemia: Secondary | ICD-10-CM | POA: Insufficient documentation

## 2023-12-21 DIAGNOSIS — I10 Essential (primary) hypertension: Secondary | ICD-10-CM | POA: Insufficient documentation

## 2023-12-21 DIAGNOSIS — M25551 Pain in right hip: Secondary | ICD-10-CM | POA: Insufficient documentation

## 2023-12-21 DIAGNOSIS — R053 Chronic cough: Secondary | ICD-10-CM | POA: Insufficient documentation

## 2023-12-21 DIAGNOSIS — G4700 Insomnia, unspecified: Secondary | ICD-10-CM | POA: Insufficient documentation

## 2023-12-21 DIAGNOSIS — G8929 Other chronic pain: Secondary | ICD-10-CM | POA: Insufficient documentation

## 2023-12-21 DIAGNOSIS — E785 Hyperlipidemia, unspecified: Secondary | ICD-10-CM | POA: Insufficient documentation

## 2023-12-21 DIAGNOSIS — Z79899 Other long term (current) drug therapy: Secondary | ICD-10-CM | POA: Insufficient documentation

## 2023-12-21 DIAGNOSIS — F419 Anxiety disorder, unspecified: Secondary | ICD-10-CM | POA: Insufficient documentation

## 2023-12-21 DIAGNOSIS — F32A Depression, unspecified: Secondary | ICD-10-CM | POA: Insufficient documentation

## 2023-12-21 DIAGNOSIS — R300 Dysuria: Secondary | ICD-10-CM | POA: Insufficient documentation

## 2023-12-21 DIAGNOSIS — M25561 Pain in right knee: Secondary | ICD-10-CM | POA: Insufficient documentation

## 2023-12-21 DIAGNOSIS — M25562 Pain in left knee: Secondary | ICD-10-CM | POA: Insufficient documentation

## 2023-12-21 LAB — COMPREHENSIVE METABOLIC PNL, FASTING
ALBUMIN/GLOBULIN RATIO: 1.8 — ABNORMAL HIGH (ref 0.8–1.4)
ALBUMIN: 4.4 g/dL (ref 3.5–5.7)
ALKALINE PHOSPHATASE: 69 U/L (ref 34–104)
ALT (SGPT): 20 U/L (ref 7–52)
ANION GAP: 6 mmol/L (ref 4–13)
AST (SGOT): 23 U/L (ref 13–39)
BILIRUBIN TOTAL: 0.5 mg/dL (ref 0.3–1.0)
BUN/CREA RATIO: 16 (ref 6–22)
BUN: 14 mg/dL (ref 7–25)
CALCIUM, CORRECTED: 9.3 mg/dL (ref 8.9–10.8)
CALCIUM: 9.6 mg/dL (ref 8.6–10.3)
CHLORIDE: 106 mmol/L (ref 98–107)
CO2 TOTAL: 27 mmol/L (ref 21–31)
CREATININE: 0.86 mg/dL (ref 0.60–1.30)
ESTIMATED GFR: 69 mL/min/{1.73_m2} (ref >59)
GLOBULIN: 2.4 (ref 2.0–3.5)
GLUCOSE: 94 mg/dL (ref 74–109)
OSMOLALITY, CALCULATED: 278 mosm/kg (ref 270–290)
POTASSIUM: 4.5 mmol/L (ref 3.5–5.1)
PROTEIN TOTAL: 6.8 g/dL (ref 6.4–8.9)
SODIUM: 139 mmol/L (ref 136–145)

## 2023-12-21 LAB — URINALYSIS, MACROSCOPIC
BILIRUBIN: NEGATIVE mg/dL
BLOOD: NEGATIVE mg/dL
GLUCOSE: NEGATIVE mg/dL
KETONES: NEGATIVE mg/dL
LEUKOCYTES: 25 WBCs/uL — AB
NITRITE: NEGATIVE
PH: 5.5 (ref 5.0–9.0)
PROTEIN: 10 mg/dL
SPECIFIC GRAVITY: 1.024 (ref 1.002–1.030)
UROBILINOGEN: NORMAL mg/dL

## 2023-12-21 LAB — MICROALBUMIN/CREATININE RATIO, URINE, RANDOM
CREATININE RANDOM URINE: 265 mg/dL — ABNORMAL HIGH (ref 30–125)
MICROALBUMIN RANDOM URINE: 2.4 mg/dL
MICROALBUMIN/CREATININE RATIO RANDOM URINE: 9.1 mg/g

## 2023-12-21 LAB — LIPID PANEL
CHOL/HDL RATIO: 2.9
CHOLESTEROL: 155 mg/dL (ref <200)
HDL CHOL: 53 mg/dL (ref >=40)
LDL CALC: 76 mg/dL (ref 0–100)
TRIGLYCERIDES: 128 mg/dL (ref <=150)
VLDL CALC: 26 mg/dL (ref 0–50)

## 2023-12-21 LAB — DRUG SCREEN, NO CONFIRMATION, URINE
AMPHETAMINES URINE: NEGATIVE
BARBITURATES URINE: NEGATIVE
BENZODIAZEPINES URINE: NEGATIVE
BUPRENORPHINE URINE: NEGATIVE
CANNABINOIDS URINE: NEGATIVE
COCAINE METABOLITES URINE: NEGATIVE
FENTANYL, URINE: NEGATIVE
METHADONE URINE: NEGATIVE
OPIATES URINE: POSITIVE — AB
OXYCODONE URINE: NEGATIVE
PCP URINE: NEGATIVE

## 2023-12-21 LAB — CBC WITH DIFF
BASOPHIL #: 0.1 10*3/uL (ref 0.00–0.10)
BASOPHIL %: 1 % (ref 0–1)
EOSINOPHIL #: 0.4 10*3/uL (ref 0.00–0.50)
EOSINOPHIL %: 4 % (ref 1–7)
HCT: 44.9 % — ABNORMAL HIGH (ref 31.2–41.9)
HGB: 14.8 g/dL — ABNORMAL HIGH (ref 10.9–14.3)
LYMPHOCYTE #: 3.4 10*3/uL — ABNORMAL HIGH (ref 1.10–3.10)
LYMPHOCYTE %: 36 % (ref 16–46)
MCH: 29.2 pg (ref 24.7–32.8)
MCHC: 33 g/dL (ref 32.3–35.6)
MCV: 88.4 fL (ref 75.5–95.3)
MONOCYTE #: 0.5 10*3/uL (ref 0.20–0.90)
MONOCYTE %: 6 % (ref 4–11)
MPV: 9.3 fL (ref 7.9–10.8)
NEUTROPHIL #: 5 10*3/uL (ref 1.90–8.20)
NEUTROPHIL %: 53 % (ref 43–77)
PLATELETS: 277 10*3/uL (ref 140–440)
RBC: 5.08 10*6/uL — ABNORMAL HIGH (ref 3.63–4.92)
RDW: 13.7 % (ref 12.3–17.7)
WBC: 9.3 10*3/uL (ref 3.8–11.8)

## 2023-12-21 LAB — FOLATE: FOLATE: 7.9 ng/mL (ref 5.9–24.8)

## 2023-12-21 LAB — IRON TRANSFERRIN AND TIBC
IRON (TRANSFERRIN) SATURATION: 27 % (ref 15–50)
IRON: 83 ug/dL (ref 50–212)
TOTAL IRON BINDING CAPACITY: 302 ug/dL (ref 250–450)
TRANSFERRIN: 216 mg/dL (ref 203–362)
UIBC: 219 ug/dL (ref 130–375)

## 2023-12-21 LAB — THYROID STIMULATING HORMONE WITH FREE T4 REFLEX: TSH: 1.352 u[IU]/mL (ref 0.450–5.330)

## 2023-12-21 LAB — HGA1C (HEMOGLOBIN A1C WITH EST AVG GLUCOSE): HEMOGLOBIN A1C: 5.6 % (ref 4.0–6.0)

## 2023-12-21 LAB — URINALYSIS, MICROSCOPIC: SQUAMOUS EPITHELIAL: 7 /[HPF] (ref <28)

## 2023-12-21 LAB — VITAMIN B12: VITAMIN B 12: >1500 pg/mL — ABNORMAL HIGH (ref 180–914)

## 2023-12-21 LAB — VITAMIN D 25 TOTAL: VITAMIN D 25, TOTAL: 28.7 ng/mL — ABNORMAL LOW (ref 30.00–100.00)

## 2023-12-21 LAB — MAGNESIUM: MAGNESIUM: 1.9 mg/dL (ref 1.9–2.7)

## 2023-12-21 MED ORDER — LORAZEPAM 2 MG TABLET
2.0000 mg | ORAL_TABLET | Freq: Every evening | ORAL | 2 refills | Status: DC | PRN
Start: 2023-12-21 — End: 2024-02-21

## 2023-12-21 MED ORDER — PRAVASTATIN 80 MG TABLET
80.0000 mg | ORAL_TABLET | Freq: Every day | ORAL | 1 refills | Status: DC
Start: 2023-12-21 — End: 2024-01-18

## 2023-12-21 MED ORDER — CYANOCOBALAMIN (VIT B-12) 1,000 MCG/ML INJECTION SOLUTION
1000.0000 ug | INTRAMUSCULAR | Status: AC
Start: 2023-12-21 — End: 2023-12-21
  Administered 2023-12-21: 1000 ug via SUBCUTANEOUS

## 2023-12-21 MED ORDER — FUROSEMIDE 40 MG TABLET
40.0000 mg | ORAL_TABLET | Freq: Every day | ORAL | 1 refills | Status: DC | PRN
Start: 2023-12-21 — End: 2024-01-18

## 2023-12-21 MED ORDER — ACETAMINOPHEN 300 MG-CODEINE 30 MG TABLET
1.0000 | ORAL_TABLET | Freq: Three times a day (TID) | ORAL | 0 refills | Status: DC
Start: 2023-12-21 — End: 2024-01-18

## 2023-12-21 NOTE — Nursing Note (Signed)
 12/21/23 1300   Urine Test   Performed Status: Automated   Time collected 1349   Manufacturer Siemens   Urine Test - Siemens   Color (Ref Range: Yellow) Yellow   Clarity (Ref Range: Clear) Clear   Glucose (Ref Range: Negative mg/dL) Negative   Bilirubin (Ref Range: Negative mg/dL) (!) 1+   Ketones (Ref Range: Negative mg/dL) (!) Trace (5 mg/dl)   Urine Specific Gravity (Ref Range: 1.005 - 1.030) > or equal to 1.030   Blood (urine) (Ref Range: Negative mg/dL) Negative   pH (Ref Range: 5.0 - 8.0) 5.5   Protein (Ref Range: Negative mg/dL) Negative   Urobilinogen (Ref Range: Normal) 0.2mg /dL (Normal)   Nitrite (Ref Range: Negative) Negative   Leukocytes (Ref Range: Negative WBC's/uL) Negative   Initials KD,CMA

## 2023-12-21 NOTE — Assessment & Plan Note (Signed)
 Labs obtained in office today concerning kidney function

## 2023-12-21 NOTE — Assessment & Plan Note (Signed)
 A1c level previously 5.8  Continue low sugar carb diet  Labs obtained in office today concerning sugar and A1c levels

## 2023-12-21 NOTE — Assessment & Plan Note (Signed)
 Prometh DM p.r.n. cough

## 2023-12-21 NOTE — Assessment & Plan Note (Signed)
 Potassium levels obtained as well in lab

## 2023-12-21 NOTE — Assessment & Plan Note (Signed)
 Continue diet weight management  Patient currently not on statin therapy will follow-up with labs

## 2023-12-21 NOTE — Assessment & Plan Note (Signed)
 Continue Ativan 2 mg nightly p.r.n.

## 2023-12-21 NOTE — Assessment & Plan Note (Signed)
 Continue symptomatic treatment along with Tylenol No. 3 p.r.n. pain

## 2023-12-21 NOTE — Assessment & Plan Note (Signed)
 Blood pressure stable on Bystolic 10 mg daily along with Norvasc 5 mg daily

## 2023-12-21 NOTE — Assessment & Plan Note (Signed)
 Continue Tylenol No. 3 t.i.d. p.r.n.-refills given

## 2023-12-21 NOTE — Progress Notes (Signed)
 [facility]  [address]  [place] [address]          Follow Up        Reason for Visit: Follow Up (Routine 1 month ) urine check    History of Present Illness  Christine Mcmillan is a 78 y.o. female who is being seen today in office for f/u as well as request to check urine as well today. + hx of  UTIs noted in the past. She has been having  some burning for past week or so. Pt takes macrodantin 50 mg q hs for proph and has been given estrogen cream to use 2 x weekly as well as dmannose but poorly compliant on use.    F/u BP/HTN w reading stable at 118/80.  Pt is currently taking Norvasc 5mg  po daily along w bystolic 10 mg daily.  Blood pressure has been stable when checked at home as well.  Denies chest pain headaches dizziness. She does have hx of some renal insuff w last GFR 52. Referral made to the urologist as well as nephrologist however pt did not f/u as scheduled and appt never rescheduled.  She has had some issues as well with low potassium most likely related to her chronic diuretic use.  She is currently not on potassium supplement but denies cramping.    Follow-up cholesterol/lipids for which patient is currently on Pravachol 80 mg nightly.  At this time she has been side effects or issues with medication and follow-up labs are needed.    Follow-up abnormal glucose with history of prediabetes noted.  Last A1c level was stable at 5.8 with the past several readings staying under 6.0.  Patient does not monitor her sugars outpatient but denies increased thirst or urination.    Follow-up chronic anxiety/insomnia for which patient uses Ativan 2 mg nightly.  Patient has went back and forth from several medications including Xanax Valium trazodone. She has used Lexapro as well as Effexor in the past for her depression but stopped medication after stating she did not feel like it was helping.  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  Also noted Sometime back did refer her to the pulmonologist have sleep studies but she did not comply with appointment.    Follow-up chronic cough for which she has had for numerous years and was referred to the pulmonologist however poorly compliant w appts.  She is prescription for promethazine DM  however prefers w codeine noting it helps her sleep better.  She has used meds for the chronic cough for multiple years.  She has had pneumonia in the past but currently no chest congestion shortness a breath wheezing noted.    F/u chronic back pain/joint pain/degenerative joint disease w previous MRI of lumbar spine showing moderate scoliosis, old T12 compression fx, moderate DJD and mild spinal stenosis. Pt has chronic back pain and uses Tylenol #3  prn. A referral has also been made to neurosurgeon for eval but pt has not been seen yet.    Referred to Dr Concha Se also concerning her GERD issues as well as colonoscopy but pt did not go to appt and wants to wait at this time      PHQ Questionnaire         Past Medical History:   Diagnosis Date    Abnormal renal function  Anxiety     Arthralgia     Bell's palsy     Chronic hypokalemia     COVID-19 vaccine series completed     Depression     Esophageal reflux     History of memory loss     History of recurrent UTIs     Hypertension          Past Surgical History:   Procedure Laterality Date    BRAIN TUMOR EXCISION      HX LIPOMA RESECTION N/A     Lipoma on back      Family Medical History:       Problem Relation (Age of Onset)    Cancer Mother            Social History     Tobacco Use    Smoking status: Never    Smokeless tobacco: Never   Substance Use Topics    Alcohol use: Never    Drug use: Never     Medication:  amLODIPine (NORVASC) 5 mg Oral Tablet, Take 1.5 Tablets (7.5 mg total) by mouth Once a day  cyclobenzaprine (FLEXERIL) 10 mg Oral Tablet, TAKE ONE TABLET BY MOUTH THREE TIMES  DAILY  ergocalciferol, vitamin D2, (DRISDOL) 1,250 mcg (50,000 unit) Oral Capsule, Take 1 Capsule (50,000 Units total) by mouth Every 7 days  NALOXONE 4 MG/ACTUATION NASAL SPRAY, 1 Spray by INTRANASAL route Every 2 minutes as needed for actual or suspected opioid overdose. Call 911 if used.  nebivoloL (BYSTOLIC) 10 mg Oral Tablet, Take 1 Tablet (10 mg total) by mouth Once a day  nitrofurantoin macrocrystaL (MACRODANTIN) 50 mg Oral Capsule, Take 1 Capsule (50 mg total) by mouth Every night  omeprazole (PRILOSEC) 20 mg Oral Capsule, Delayed Release(E.C.), Take 1 Capsule (20 mg total) by mouth Once a day  acetaminophen-codeine (TYLENOL #3) 300-30 mg Oral Tablet, Take 1 Tablet by mouth Three times a day  d-mannose 500 mg Oral Capsule, Take 1 Capsule (500 mg total) by mouth Twice daily  furosemide (LASIX) 40 mg Oral Tablet, Take 1 Tablet (40 mg total) by mouth Once per day as needed for Other (Edema)  LORazepam (ATIVAN) 2 mg Oral Tablet, Take 1 Tablet (2 mg total) by mouth Every night as needed for Anxiety  pravastatin (PRAVACHOL) 80 mg Oral Tablet, Take 1 Tablet (80 mg total) by mouth Once a day    No facility-administered medications prior to visit.    Allergies:  Allergies   Allergen Reactions    Acetic Acid-Aluminum Acetate Swelling    Sertraline  Other Adverse Reaction (Add comment)     Headache         Physical Exam:  Vitals:    12/21/23 1315   BP: 118/80   Pulse: 69   SpO2: 97%   Weight: 70.3 kg (155 lb)   Height: 1.524 m (5')   BMI: 30.27      Vitals stable  General appearance: alert, cooperative, in no acute distress  HEENT: Ears: clear, no effusion, no erythema; Throat: non-erythematous; no LAD, neck supple, moist mucus membranes  Still slight droop RT side of face/mouth  Lungs: clear to auscultation bilaterally; no wheezes or rhonchi   Heart: regular rate and rhythm; normal S1 & S2; no murmur  Abdomen: soft, + mild tenderness suprapubic ; no cva tenderness noted; not distended; bowel sounds  present  Extremities: extremities normal ROM, no cyanosis or edema , no rash  +DJD bilat hands/knees  Gait stable  Psych: alert and oriented  x 3  Neuro: CN 2-12 grossly intact    Urine Dip Results:   Time collected: 1349  Glucose (Ref Range: Negative mg/dL): Negative  Bilirubin (Ref Range: Negative mg/dL): (!) Small  Ketones (Ref Range: Negative mg/dL): (!) Trace (5 mg/dl)  Urine Specific Gravity (Ref Range: 1.005 - 1.030): > or equal to 1.030  Blood (urine) (Ref Range: Negative mg/dL): Negative  pH (Ref Range: 5.0 - 8.0): 5.5  Protein (Ref Range: Negative mg/dL): Negative  Urobilinogen (Ref Range: Normal): 0.2mg /dL (Normal)  Nitrite (Ref Range: Negative): Negative  Leukocytes (Ref Range: Negative WBC's/uL): Negative         Assessment & Plan  Dysuria  Urine dip today in office unremarkable however microscopic eval ordered  Encouraged to increase hydration continue D mannose along with Macrodantin 50 mg nightly  Essential hypertension  Blood pressure stable on Bystolic 10 mg daily along with Norvasc 5 mg daily  Abnormal renal function  Labs obtained in office today concerning kidney function  Chronic hypokalemia  Potassium levels obtained as well in lab  Hyperlipidemia, unspecified hyperlipidemia type  Continue diet weight management  Patient currently not on statin therapy will follow-up with labs  Prediabetes  A1c level previously 5.8  Continue low sugar carb diet  Labs obtained in office today concerning sugar and A1c levels  Anxiety and depression  Continue Ativan 2 mg nightly p.r.n.  Insomnia, unspecified type    Chronic cough  Prometh DM p.r.n. cough  Chronic low back pain, unspecified back pain laterality, unspecified whether sciatica present  Continue Tylenol No. 3 t.i.d. p.r.n.-refills given  Chronic arthralgias of knees and hips  Continue symptomatic treatment along with Tylenol No. 3 p.r.n. pain  Vitamin D deficiency  Continue vitamin-D supplement  Encounter for long-term current use of medication  UDS  obtained in office    Orders Placed This Encounter    CBC/DIFF    COMPREHENSIVE METABOLIC PNL, FASTING    HGA1C (HEMOGLOBIN A1C WITH EST AVG GLUCOSE)    URINALYSIS, MACROSCOPIC AND MICROSCOPIC W/CULTURE REFLEX    LIPID PANEL    MICROALBUMIN/CREATININE RATIO, URINE, RANDOM    VITAMIN B12    FOLATE    THYROID STIMULATING HORMONE WITH FREE T4 REFLEX    IRON TRANSFERRIN AND TIBC    MAGNESIUM    VITAMIN D 25 TOTAL    CBC WITH DIFF    URINALYSIS, MACROSCOPIC    URINALYSIS, MICROSCOPIC    DRUG SCREEN, NO CONFIRMATION, URINE    POCT URINE DIPSTICK    cyanocobalamin (VITAMIN B12) 1000 mcg/mL injection    LORazepam (ATIVAN) 2 mg Oral Tablet    furosemide (LASIX) 40 mg Oral Tablet    pravastatin (PRAVACHOL) 80 mg Oral Tablet    acetaminophen-codeine (TYLENOL #3) 300-30 mg Oral Tablet            Post-Discharge Follow Up Appointments       Wednesday Jan 18, 2024    Return Patient Visit with Eyvonne Mechanic, PA-C at  1:30 PM      Monday Feb 20, 2024    Return Patient Visit with Eyvonne Mechanic, PA-C at  2:00 PM      Wednesday Mar 14, 2024    Return Patient Visit with Eyvonne Mechanic, PA-C at  1:00 PM      Wednesday Apr 11, 2024    Return Patient Visit with Eyvonne Mechanic, PA-C at  2:00 PM      Wednesday Nov 28, 2024    Return Telephone Visit with Hyman Bible, Amy,  PA-C at  1:00 PM      [facility]  [address]  [place] [address]  [phone]                 Seek medical attention for new or worsening symptoms.  Patient has been seen in this clinic within the last 3 years.     Amy Hyman Bible, PA-C          I personally reviewed the documentation of care provided by the certified physician assistant and I agree with her medical decision making, Weyman Pedro, D.O.   This note was partially created using [employer] Fluency Direct system (voice recognition software) and is inherently subject to errors including those of syntax and "sound-alike" substitutions which may escape proofreading.  In such instances,  original meaning may be extrapolated by contextual derivation.

## 2023-12-21 NOTE — Nursing Note (Signed)
Patient is here for routine 1 month follow up for medication refills.

## 2023-12-22 ENCOUNTER — Other Ambulatory Visit (INDEPENDENT_AMBULATORY_CARE_PROVIDER_SITE_OTHER): Payer: Self-pay | Admitting: Physician Assistant

## 2023-12-22 ENCOUNTER — Ambulatory Visit (INDEPENDENT_AMBULATORY_CARE_PROVIDER_SITE_OTHER): Payer: Self-pay

## 2023-12-23 ENCOUNTER — Other Ambulatory Visit (INDEPENDENT_AMBULATORY_CARE_PROVIDER_SITE_OTHER): Payer: Self-pay | Admitting: Physician Assistant

## 2024-01-18 ENCOUNTER — Encounter (INDEPENDENT_AMBULATORY_CARE_PROVIDER_SITE_OTHER): Payer: Self-pay | Admitting: Physician Assistant

## 2024-01-18 ENCOUNTER — Ambulatory Visit: Payer: Self-pay | Attending: Physician Assistant | Admitting: Physician Assistant

## 2024-01-18 ENCOUNTER — Other Ambulatory Visit: Payer: Self-pay

## 2024-01-18 DIAGNOSIS — N390 Urinary tract infection, site not specified: Secondary | ICD-10-CM

## 2024-01-18 DIAGNOSIS — Z09 Encounter for follow-up examination after completed treatment for conditions other than malignant neoplasm: Secondary | ICD-10-CM

## 2024-01-18 DIAGNOSIS — R319 Hematuria, unspecified: Secondary | ICD-10-CM

## 2024-01-18 DIAGNOSIS — G8929 Other chronic pain: Secondary | ICD-10-CM

## 2024-01-18 DIAGNOSIS — M545 Low back pain, unspecified: Secondary | ICD-10-CM

## 2024-01-18 DIAGNOSIS — I10 Essential (primary) hypertension: Secondary | ICD-10-CM

## 2024-01-18 MED ORDER — PHENAZOPYRIDINE 100 MG TABLET
100.0000 mg | ORAL_TABLET | Freq: Three times a day (TID) | ORAL | 0 refills | Status: AC
Start: 2024-01-18 — End: 2024-01-21

## 2024-01-18 MED ORDER — CYCLOBENZAPRINE 10 MG TABLET
10.0000 mg | ORAL_TABLET | Freq: Three times a day (TID) | ORAL | 1 refills | Status: DC
Start: 2024-01-18 — End: 2024-03-20

## 2024-01-18 MED ORDER — NITROFURANTOIN MACROCRYSTAL 50 MG CAPSULE
50.0000 mg | ORAL_CAPSULE | Freq: Every evening | ORAL | 3 refills | Status: DC
Start: 2024-01-18 — End: 2024-03-20

## 2024-01-18 MED ORDER — AMOXICILLIN 875 MG-POTASSIUM CLAVULANATE 125 MG TABLET
1.0000 | ORAL_TABLET | Freq: Two times a day (BID) | ORAL | 0 refills | Status: DC
Start: 2024-01-18 — End: 2024-02-21

## 2024-01-18 MED ORDER — FUROSEMIDE 40 MG TABLET
40.0000 mg | ORAL_TABLET | Freq: Every day | ORAL | 1 refills | Status: DC | PRN
Start: 2024-01-18 — End: 2024-02-21

## 2024-01-18 MED ORDER — ACETAMINOPHEN 300 MG-CODEINE 30 MG TABLET
1.0000 | ORAL_TABLET | Freq: Three times a day (TID) | ORAL | 0 refills | Status: DC
Start: 2024-01-18 — End: 2024-02-21

## 2024-01-18 MED ORDER — NEBIVOLOL 10 MG TABLET
10.0000 mg | ORAL_TABLET | Freq: Every day | ORAL | 1 refills | Status: DC
Start: 2024-01-18 — End: 2024-03-20

## 2024-01-18 MED ORDER — PRAVASTATIN 80 MG TABLET
80.0000 mg | ORAL_TABLET | Freq: Every day | ORAL | 1 refills | Status: DC
Start: 2024-01-18 — End: 2024-02-21

## 2024-01-18 MED ORDER — PROMETHAZINE-DM 6.25 MG-15 MG/5 ML ORAL SYRUP
5.0000 mL | ORAL_SOLUTION | Freq: Four times a day (QID) | ORAL | 0 refills | Status: DC | PRN
Start: 2024-01-18 — End: 2024-02-21

## 2024-01-18 NOTE — Progress Notes (Signed)
 INTERNAL MEDICINE, [facility]  [address]  [place] [address]  Operated by [facility]  Transitional Care Management Note    Name: Christine Mcmillan MRN:  [id number]   Date: 01/18/2024 Age: 78 y.o.     Chief Complaint: Emergency Room Follow Up and Medication Refill     TELEMEDICINE DOCUMENTATION:    [facility]    Patient/family aware of provider location:  yes  Patient/family consent for telemedicine:  yes  Examination observed and performed by:  Cloyce Darby, PA-C     SUBJECTIVE:  Christine Mcmillan is a 78 y.o. female presenting today for follow-up after being seen in the emergency room on March 14th for urinary complaints.  ER evaluation noted:        Eugenia Hess, MD  Physician  Emergency     ED Provider Notes     Signed     Date of Service: 01/06/24 0050  Creation Time: 01/06/24 0050  Note Received: 01/18/24 1251          ED Provider Note     Subjective      Christine Mcmillan is a 78 y.o. 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  She has had a few days of frequency dysuria and she thinks some hematuria as well.  She has had some frequent UTIs in the past and has been on Macrodantin  recently for prophylaxis.  She is also having worsening back pain.  She always has some amount of low back pain due to arthritis, but states it has been getting worse with her urinary symptom progression.  Reports subjective fever and chills last night and reports improvement with Tylenol .  Given her history of single kidney she wanted to come in and get treated sooner rather than later.               78 y.o. female here with dysuria, frequency, hematuria and back pain.      DDX Includes: UTI, pyelonephritis, less likely perinephric abscess, others     Plan:  Screening workup, Rocephin for UTI and likely discharge home on oral antibiotics.     Medical Decision Making  Amount and/or Complexity of Data Reviewed  Labs: ordered.     Risk  Prescription drug management.           ED Course <redacted file path>:   Vitals noted and reassuring, exam as above.  Will screen urine, basic labs to rule out AKI or leukocytosis at a proportion that would raise suspicion for complication such as perinephric abscess or other intra-abdominal infection.  If workup here reassuring likely discharge home on oral antibiotics.      ED Course as of 01/06/24 0212   Fri Jan 06, 2024   0154 Mild leukocytosis, 10.8 K UA with 20-50 whites, moderately contaminated with squamous epithelial cells, leuk esterase positive with trace blood and 10-25 RBCs.  Consistent with cystitis with hematuria.  Still awaiting CMP.  Rocephin given.  Will discharge home on Keflex .  Await final CMP results prior to disposition. [SW]   0210 No AKI, minimal dehydration based on BUN/creatinine ratio of 21.1, suspect more likely related to lower creatinine than true prerenal azotemia.  Vital support this.  Will send home with oral Keflex  and close PCP follow-up. [SW]  0210 Diagnosis and care plan discussed. Ok for discharge home, I do not feel the patient requires admission today based on their clinical presentation and workup here today.  I discussed all results, answered all questions, strict return precautions given. Pt verbalized understanding and agrees with plan.    [SW]       ED Course User Index  [SW] Whitty, Cleve Dale, MD         Procedures           Clinical Impression <redacted file path>:  1. Acute cystitis with hematuria          Condition: Stable and Improved     Disposition <redacted file path>:Discharge    Garr Kalata, MD   [facility]         PATIENT STATES THAT SHE IS NOT FEELING ANY BETTER.  SHE FINISHED THE KEFLEX  AND STILL HAS URINARY PRESSURE PAIN FREQUENCY.  SHE STARTED AZO OVER-THE-COUNTER 2 DAYS AGO  BUT SYMPTOMS REMAIN.  SHE STATES PREVIOUSLY WHEN CULTURES WERE OBTAINED AUGMENTIN  SEEMED TO BE THE ANTIBIOTIC THAT HELPED HER SO REQUESTING A PRESCRIPTION FOR AUGMENTIN .  SHE DENIES ANY FEVER CHILLS NAUSEA VOMITING.  PATIENT WAS UNABLE TO GET OUT TODAY IN ORDER TO HAVE A REPEAT URINALYSIS OBTAINED AND UPON REVIEW OF CHART NO CULTURE WAS OBTAINED ON HER ER EVALUATION.  SHE ALSO REQUESTS MEDICATION REFILLS OF HER ROUTINE MEDS TODAY. STATES BP HAS BEEN GOOD AND CHRONIC BACK PAIN/ARTHRALGIAS STABLE W TYLENOL  #3.       amLODIPine  (NORVASC) 5 mg Oral Tablet, Take 1.5 Tablets (7.5 mg total) by mouth Once a day  ergocalciferol , vitamin D2, (DRISDOL ) 1,250 mcg (50,000 unit) Oral Capsule, Take 1 Capsule (50,000 Units total) by mouth Every 7 days  LORazepam  (ATIVAN ) 2 mg Oral Tablet, Take 1 Tablet (2 mg total) by mouth Every night as needed for Anxiety  NALOXONE 4 MG/ACTUATION NASAL SPRAY, 1 Spray by INTRANASAL route Every 2 minutes as needed for actual or suspected opioid overdose. Call 911 if used.  omeprazole  (PRILOSEC) 20 mg Oral Capsule, Delayed Release(E.C.), Take 1 Capsule (20 mg total) by mouth Once a day  acetaminophen -codeine  (TYLENOL  #3) 300-30 mg Oral Tablet, Take 1 Tablet by mouth Three times a day  cyclobenzaprine  (FLEXERIL ) 10 mg Oral Tablet, TAKE ONE TABLET BY MOUTH THREE TIMES DAILY  furosemide  (LASIX ) 40 mg Oral Tablet, Take 1 Tablet (40 mg total) by mouth Once per day as needed for Other (Edema)  nebivoloL  (BYSTOLIC ) 10 mg Oral Tablet, Take 1 Tablet (10 mg total) by mouth Once a day  nitrofurantoin  macrocrystaL (MACRODANTIN ) 50 mg Oral Capsule, Take 1 Capsule (50 mg total) by mouth Every night  pravastatin  (PRAVACHOL ) 80 mg Oral Tablet, Take 1 Tablet (80 mg total) by mouth Once a day    No facility-administered medications prior to visit.     Social History     Socioeconomic History    Marital status: Divorced   Tobacco Use    Smoking status: Never    Smokeless tobacco: Never   Substance and Sexual Activity     Alcohol use: Never    Drug use: Never     Social Determinants of Health     Financial Resource Strain: Low Risk  (07/22/2022)    Financial Resource Strain     SDOH Financial: No   Transportation Needs: Low Risk  (07/22/2022)    Transportation Needs     SDOH Transportation: No   Social Connections: Low Risk  (07/22/2022)    Social  Connections     SDOH Social Isolation: 5 or more times a week   Intimate Partner Violence: Low Risk  (07/22/2022)    Intimate Partner Violence     SDOH Domestic Violence: No   Housing Stability: Low Risk  (07/22/2022)    Housing Stability     SDOH Housing Situation: I have housing.     SDOH Housing Worry: No      Allergies   Allergen Reactions    Acetic Acid-Aluminum Acetate Swelling    Sertraline  Other Adverse Reaction (Add comment)     Headache        Past Medical History:   Diagnosis Date    Abnormal renal function     Anxiety     Arthralgia     Bell's palsy     Chronic hypokalemia     COVID-19 vaccine series completed     Depression     Esophageal reflux     History of memory loss     History of recurrent UTIs     Hypertension         OBJECTIVE:   There were no vitals taken for this visit.     Physical Exam  Pulmonary:      Effort: No respiratory distress.      Comments: Patient able to speak in clear sentences  Neurological:      Mental Status: She is alert.   Psychiatric:         Thought Content: Thought content normal.          Data Reviewed  Medication Reconciliation completed    Assessment and Plan    ICD-10-CM    1. Hospital discharge follow-up  Z09       2. Urinary tract infection with hematuria, site unspecified  N39.0 URINALYSIS, MACROSCOPIC AND MICROSCOPIC W/CULTURE REFLEX    R31.9       3. Essential hypertension  I10       4. Chronic low back pain, unspecified back pain laterality, unspecified whether sciatica present  M54.50     G89.29         Other transition actions (Optional) -: Discharge documentation was reviewed    An order was given to obtain urinalysis with culture if  pt can get out to obtain at [facility]  Will start Augmentin  875mg  bid x 10 days  Pyridium  100 mg tid x 2 days  Increase hydration as discussed  F/u sx no better by monday      Total time on visit 25 mins      Post-Discharge Follow Up Appointments       Wednesday Jan 18, 2024    Return Telephone Visit with Cloyce Darby, PA-C at  1:30 PM      Monday Feb 20, 2024    Return Patient Visit with Cloyce Darby, PA-C at  2:00 PM      Wednesday Mar 14, 2024    Return Patient Visit with Cloyce Darby, PA-C at  1:00 PM      Wednesday Apr 11, 2024    Return Patient Visit with Cloyce Darby, PA-C at  2:00 PM      Wednesday Nov 28, 2024    Return Telephone Visit with Cloyce Darby, PA-C at  1:00 PM      Internal Medicine, [facility]  [facility], [place]  [address]  [place] [address]  [phone]                Iwalani Templeton  Macguire Holsinger, PA-C

## 2024-02-15 ENCOUNTER — Ambulatory Visit: Payer: Medicare Other | Admitting: Physician Assistant

## 2024-02-20 ENCOUNTER — Ambulatory Visit (INDEPENDENT_AMBULATORY_CARE_PROVIDER_SITE_OTHER): Payer: Medicare Other | Admitting: Physician Assistant

## 2024-02-20 ENCOUNTER — Other Ambulatory Visit: Payer: Self-pay

## 2024-02-21 ENCOUNTER — Ambulatory Visit: Attending: Physician Assistant | Admitting: Physician Assistant

## 2024-02-21 ENCOUNTER — Encounter (INDEPENDENT_AMBULATORY_CARE_PROVIDER_SITE_OTHER): Payer: Self-pay | Admitting: Physician Assistant

## 2024-02-21 ENCOUNTER — Other Ambulatory Visit (INDEPENDENT_AMBULATORY_CARE_PROVIDER_SITE_OTHER): Payer: Self-pay | Admitting: Physician Assistant

## 2024-02-21 DIAGNOSIS — M25561 Pain in right knee: Secondary | ICD-10-CM

## 2024-02-21 DIAGNOSIS — M545 Low back pain, unspecified: Secondary | ICD-10-CM

## 2024-02-21 DIAGNOSIS — R053 Chronic cough: Secondary | ICD-10-CM

## 2024-02-21 DIAGNOSIS — M1990 Unspecified osteoarthritis, unspecified site: Secondary | ICD-10-CM

## 2024-02-21 DIAGNOSIS — F419 Anxiety disorder, unspecified: Secondary | ICD-10-CM

## 2024-02-21 DIAGNOSIS — F32A Depression, unspecified: Secondary | ICD-10-CM

## 2024-02-21 DIAGNOSIS — I10 Essential (primary) hypertension: Secondary | ICD-10-CM

## 2024-02-21 DIAGNOSIS — G8929 Other chronic pain: Secondary | ICD-10-CM

## 2024-02-21 MED ORDER — PROMETHAZINE-DM 6.25 MG-15 MG/5 ML ORAL SYRUP
5.0000 mL | ORAL_SOLUTION | Freq: Four times a day (QID) | ORAL | 0 refills | Status: AC | PRN
Start: 2024-02-21 — End: 2024-03-20

## 2024-02-21 MED ORDER — FUROSEMIDE 40 MG TABLET
40.0000 mg | ORAL_TABLET | Freq: Every day | ORAL | 1 refills | Status: DC | PRN
Start: 2024-02-21 — End: 2024-03-20

## 2024-02-21 MED ORDER — PRAVASTATIN 80 MG TABLET
80.0000 mg | ORAL_TABLET | Freq: Every day | ORAL | 1 refills | Status: DC
Start: 2024-02-21 — End: 2024-03-20

## 2024-02-21 MED ORDER — LORAZEPAM 2 MG TABLET
2.0000 mg | ORAL_TABLET | Freq: Every evening | ORAL | 2 refills | Status: DC | PRN
Start: 2024-02-21 — End: 2024-03-20

## 2024-02-21 MED ORDER — ACETAMINOPHEN 300 MG-CODEINE 30 MG TABLET
1.0000 | ORAL_TABLET | Freq: Three times a day (TID) | ORAL | 0 refills | Status: DC
Start: 2024-02-21 — End: 2024-03-20

## 2024-02-21 NOTE — Progress Notes (Signed)
 [facility]NAL MEDICINE, [facility]  [address]  [place] [address]        Telephone Visit    Name:  Christine Mcmillan MRN: [id number]   Date:  02/21/2024 Age:   78 y.o.     The patient/family initiated a request for telephone service.  Verbal consent for this service was obtained from the patient/family.    TELEMEDICINE DOCUMENTATION:    [facility]    Patient/family aware of provider location:  yes  Patient/family consent for telemedicine:  yes  Examination observed and performed by:  Cloyce Darby, PA-C    Chief Complaint   Patient presents with    Medication Refill    Follow Up        Call notes:  Christine Mcmillan is a 78 y.o. female being seen via TeleMed  for f/u  Concerning BP/hypertension for which she states has been good when checked.  Reports she is feeling well overall and continues to take her Bystolic  10 mg daily along with Norvasc 7.5 mg daily which she tolerates well.  Denies headaches dizziness chest pain.    Follow-up chronic anxiety for which she takes Ativan  p.r.n. and needs refills also.  At this time anxiety is stable at current dose and she denies panic attacks or new issues.    Follow-up chronic back pain/osteoarthritis for which patient uses Tylenol  3 p.r.n. pain.  She has a lot of other joint pain including hip knee hands.  Patient states Tylenol  3 does help her pain and she also uses topical Pennsaid as needed.  No side effects or issues with medication noted and refills on med requested.  She continues to follow with orthopedic concerning her knee pain and states she is waiting on the booster shot to get approved but so far Medicare is denying.  Has follow-up with orthopedic scheduled.    Follow-up chronic cough complaints for which patient uses promethazine  cough medication p.r.n.Aaron Aas  She states her allergies has been worse with the trees and flowers blooming so cough is increased.  She denies any shortness a breath wheezing however.  Requesting medication refill on  cough med.      Current meds:     acetaminophen -codeine  (TYLENOL  #3) 300-30 mg Oral Tablet Take 1 Tablet by mouth Three times a day    amLODIPine  (NORVASC) 5 mg Oral Tablet Take 1.5 Tablets (7.5 mg total) by mouth Once a day    cyclobenzaprine  (FLEXERIL ) 10 mg Oral Tablet Take 1 Tablet (10 mg total) by mouth Three times a day    ergocalciferol , vitamin D2, (DRISDOL ) 1,250 mcg (50,000 unit) Oral Capsule Take 1 Capsule (50,000 Units total) by mouth Every 7 days    furosemide  (LASIX ) 40 mg Oral Tablet Take 1 Tablet (40 mg total) by mouth Once per day as needed for Other (Edema)    LORazepam  (ATIVAN ) 2 mg Oral Tablet Take 1 Tablet (2 mg total) by mouth Every night as needed for Anxiety    NALOXONE 4 MG/ACTUATION NASAL SPRAY 1 Spray by INTRANASAL route Every 2 minutes as needed for actual or suspected opioid overdose. Call 911 if used.    nebivoloL  (BYSTOLIC ) 10 mg Oral Tablet Take 1 Tablet (10 mg total) by mouth Daily    nitrofurantoin  macrocrystaL (MACRODANTIN ) 50 mg Oral Capsule Take 1 Capsule (50 mg total) by mouth Every night    omeprazole  (PRILOSEC) 20 mg Oral Capsule, Delayed Release(E.C.) Take 1 Capsule (20 mg total) by mouth Once a day  pravastatin  (PRAVACHOL ) 80 mg Oral Tablet Take 1 Tablet (80 mg total) by mouth Daily    promethazine -dextromethorphan (PHENERGAN -DM) 6.25-15 mg/5 mL Oral Syrup Take 5 mL by mouth Four times a day as needed for Cough for up to 7 days        Allergies   Allergen Reactions    Acetic Acid-Aluminum Acetate Swelling    Sertraline  Other Adverse Reaction (Add comment)     Headache          Past Medical History:   Diagnosis Date    Abnormal renal function     Anxiety     Arthralgia     Bell's palsy     Chronic hypokalemia     COVID-19 vaccine series completed     Depression     Esophageal reflux     History of memory loss     History of recurrent UTIs     Hypertension         Social History     Socioeconomic History    Marital status: Divorced   Tobacco Use    Smoking status: Never     Smokeless tobacco: Never   Substance and Sexual Activity    Alcohol use: Never    Drug use: Never     Social Determinants of Health     Financial Resource Strain: Low Risk  (07/22/2022)    Financial Resource Strain     SDOH Financial: No   Transportation Needs: Low Risk  (07/22/2022)    Transportation Needs     SDOH Transportation: No   Social Connections: Low Risk  (07/22/2022)    Social Connections     SDOH Social Isolation: 5 or more times a week   Intimate Partner Violence: Low Risk  (07/22/2022)    Intimate Partner Violence     SDOH Domestic Violence: No   Housing Stability: Low Risk  (07/22/2022)    Housing Stability     SDOH Housing Situation: I have housing.     SDOH Housing Worry: No        Past Surgical History:   Procedure Laterality Date    BRAIN TUMOR EXCISION      HX LIPOMA RESECTION N/A     Lipoma on back        Family Medical History:       Problem Relation (Age of Onset)    Cancer Mother             Physical Exam  Constitutional:       Comments: Patient able to relay history today without issue   Pulmonary:      Effort: Pulmonary effort is normal.   Neurological:      Mental Status: She is alert.   Psychiatric:         Mood and Affect: Mood normal.      Assessment & plan:      ICD-10-CM    1. Essential hypertension  I10       2. Anxiety and depression  F41.9     F32.A       3. Chronic low back pain, unspecified back pain laterality, unspecified whether sciatica present  M54.50     G89.29       4. Chronic arthralgias of knees and hips  M25.551     G89.29     M25.561     M25.552     M25.562       5. Chronic cough  R05.3  Med refilled as noted  Will obtain labs at next appointment    Total provider time spent with the patient on the phone: 12  minutes.    Post-Discharge Follow Up Appointments       Tuesday Mar 20, 2024    Return Patient Visit with Cloyce Darby, PA-C at  1:00 PM      Wednesday Apr 11, 2024    Return Patient Visit with Cloyce Darby, PA-C at  2:00 PM      Wednesday May 09, 2024     Return Patient Visit with Cloyce Darby, PA-C at  1:15 PM      Wednesday Jun 06, 2024    Return Patient Visit with Cloyce Darby, PA-C at  1:15 PM      Wednesday Jul 04, 2024    Return Patient Visit with Cloyce Darby, PA-C at  1:15 PM      Wednesday Nov 28, 2024    Return Telephone Visit with Cloyce Darby, PA-C at  1:00 PM      Internal Medicine, [facility]  [facility], [place]  [address]  [place] [address]  [phone]                This note was partially created using MModal Fluency Direct system (voice recognition software) and is inherently subject to errors including those of syntax and "sound-alike" substitutions which may escape proofreading.  In such instances, original meaning may be extrapolated by contextual derivation.    Ronel Rodeheaver, PA-C

## 2024-03-14 ENCOUNTER — Ambulatory Visit (INDEPENDENT_AMBULATORY_CARE_PROVIDER_SITE_OTHER): Payer: Self-pay | Admitting: Physician Assistant

## 2024-03-17 ENCOUNTER — Other Ambulatory Visit (INDEPENDENT_AMBULATORY_CARE_PROVIDER_SITE_OTHER): Payer: Self-pay | Admitting: Physician Assistant

## 2024-03-20 ENCOUNTER — Other Ambulatory Visit: Payer: Self-pay

## 2024-03-20 ENCOUNTER — Encounter (INDEPENDENT_AMBULATORY_CARE_PROVIDER_SITE_OTHER): Payer: Self-pay | Admitting: Physician Assistant

## 2024-03-20 ENCOUNTER — Ambulatory Visit: Payer: Self-pay | Attending: Physician Assistant | Admitting: Physician Assistant

## 2024-03-20 VITALS — BP 122/78 | HR 52 | Ht 60.0 in | Wt 154.0 lb

## 2024-03-20 DIAGNOSIS — G8929 Other chronic pain: Secondary | ICD-10-CM | POA: Insufficient documentation

## 2024-03-20 DIAGNOSIS — R7309 Other abnormal glucose: Secondary | ICD-10-CM | POA: Insufficient documentation

## 2024-03-20 DIAGNOSIS — F419 Anxiety disorder, unspecified: Secondary | ICD-10-CM | POA: Insufficient documentation

## 2024-03-20 DIAGNOSIS — G4700 Insomnia, unspecified: Secondary | ICD-10-CM | POA: Insufficient documentation

## 2024-03-20 DIAGNOSIS — E785 Hyperlipidemia, unspecified: Secondary | ICD-10-CM | POA: Insufficient documentation

## 2024-03-20 DIAGNOSIS — H6121 Impacted cerumen, right ear: Secondary | ICD-10-CM | POA: Insufficient documentation

## 2024-03-20 DIAGNOSIS — M545 Low back pain, unspecified: Secondary | ICD-10-CM | POA: Insufficient documentation

## 2024-03-20 DIAGNOSIS — F32A Depression, unspecified: Secondary | ICD-10-CM | POA: Insufficient documentation

## 2024-03-20 DIAGNOSIS — Z91199 Patient's noncompliance with other medical treatment and regimen due to unspecified reason: Secondary | ICD-10-CM | POA: Insufficient documentation

## 2024-03-20 DIAGNOSIS — E876 Hypokalemia: Secondary | ICD-10-CM | POA: Insufficient documentation

## 2024-03-20 DIAGNOSIS — I10 Essential (primary) hypertension: Secondary | ICD-10-CM | POA: Insufficient documentation

## 2024-03-20 DIAGNOSIS — R5382 Chronic fatigue, unspecified: Secondary | ICD-10-CM | POA: Insufficient documentation

## 2024-03-20 DIAGNOSIS — R053 Chronic cough: Secondary | ICD-10-CM | POA: Insufficient documentation

## 2024-03-20 LAB — URINALYSIS, MACROSCOPIC
BILIRUBIN: NEGATIVE mg/dL
BLOOD: NEGATIVE mg/dL
GLUCOSE: NEGATIVE mg/dL
KETONES: NEGATIVE mg/dL
LEUKOCYTES: 75 WBCs/uL — AB
NITRITE: NEGATIVE
PH: 5.5 (ref 5.0–9.0)
PROTEIN: NEGATIVE mg/dL
SPECIFIC GRAVITY: 1.025 (ref 1.002–1.030)
UROBILINOGEN: NORMAL mg/dL

## 2024-03-20 LAB — CBC WITH DIFF
BASOPHIL #: 0.1 10*3/uL (ref 0.00–0.10)
BASOPHIL %: 1 % (ref 0–1)
EOSINOPHIL #: 0.4 10*3/uL (ref 0.00–0.50)
EOSINOPHIL %: 4 % (ref 1–7)
HCT: 45.7 % — ABNORMAL HIGH (ref 31.2–41.9)
HGB: 15.8 g/dL — ABNORMAL HIGH (ref 10.9–14.3)
LYMPHOCYTE #: 2.9 10*3/uL (ref 1.10–3.10)
LYMPHOCYTE %: 30 % (ref 16–46)
MCH: 29.2 pg (ref 24.7–32.8)
MCHC: 34.6 g/dL (ref 32.3–35.6)
MCV: 84.5 fL (ref 75.5–95.3)
MONOCYTE #: 0.5 10*3/uL (ref 0.20–0.90)
MONOCYTE %: 6 % (ref 4–11)
MPV: 8.7 fL (ref 7.9–10.8)
NEUTROPHIL #: 5.6 10*3/uL (ref 1.90–8.20)
NEUTROPHIL %: 59 % (ref 43–77)
PLATELETS: 293 10*3/uL (ref 140–440)
RBC: 5.41 10*6/uL — ABNORMAL HIGH (ref 3.63–4.92)
RDW: 14 % (ref 12.3–17.7)
WBC: 9.5 10*3/uL (ref 3.8–11.8)

## 2024-03-20 LAB — HGA1C (HEMOGLOBIN A1C WITH EST AVG GLUCOSE): HEMOGLOBIN A1C: 5.8 % (ref 4.0–6.0)

## 2024-03-20 LAB — URINALYSIS, MICROSCOPIC
BACTERIA: NEGATIVE /HPF
RBCS: <1 /HPF (ref <4)
SQUAMOUS EPITHELIAL: 3 /HPF (ref <28)
WBCS: 3 /HPF (ref <6)

## 2024-03-20 MED ORDER — LORAZEPAM 2 MG TABLET
2.0000 mg | ORAL_TABLET | Freq: Every evening | ORAL | 2 refills | Status: AC | PRN
Start: 2024-03-20 — End: ?

## 2024-03-20 MED ORDER — CYANOCOBALAMIN (VIT B-12) 1,000 MCG/ML INJECTION SOLUTION
1000.0000 ug | INTRAMUSCULAR | Status: AC
Start: 2024-03-20 — End: 2024-03-20
  Administered 2024-03-20: 1000 ug via SUBCUTANEOUS

## 2024-03-20 MED ORDER — PRAVASTATIN 80 MG TABLET
80.0000 mg | ORAL_TABLET | Freq: Every day | ORAL | 1 refills | Status: AC
Start: 2024-03-20 — End: ?

## 2024-03-20 MED ORDER — ACETAMINOPHEN 300 MG-CODEINE 30 MG TABLET
1.0000 | ORAL_TABLET | Freq: Three times a day (TID) | ORAL | 0 refills | Status: AC
Start: 2024-03-20 — End: ?

## 2024-03-20 MED ORDER — DICLOFENAC 1 % TOPICAL GEL
Freq: Four times a day (QID) | CUTANEOUS | 1 refills | Status: DC
Start: 2024-03-20 — End: 2024-06-19

## 2024-03-20 MED ORDER — FUROSEMIDE 40 MG TABLET
40.0000 mg | ORAL_TABLET | Freq: Every day | ORAL | 1 refills | Status: AC | PRN
Start: 2024-03-20 — End: ?

## 2024-03-20 MED ORDER — NEBIVOLOL 10 MG TABLET
10.0000 mg | ORAL_TABLET | Freq: Every day | ORAL | 1 refills | Status: AC
Start: 2024-03-20 — End: ?

## 2024-03-20 MED ORDER — NITROFURANTOIN MACROCRYSTAL 50 MG CAPSULE
50.0000 mg | ORAL_CAPSULE | Freq: Every evening | ORAL | 3 refills | Status: DC
Start: 2024-03-20 — End: 2024-05-15

## 2024-03-20 MED ORDER — OMEPRAZOLE 20 MG CAPSULE,DELAYED RELEASE
20.0000 mg | DELAYED_RELEASE_CAPSULE | Freq: Every day | ORAL | 1 refills | Status: DC
Start: 2024-03-20 — End: 2024-09-13

## 2024-03-20 MED ORDER — CYCLOBENZAPRINE 10 MG TABLET
10.0000 mg | ORAL_TABLET | Freq: Three times a day (TID) | ORAL | 1 refills | Status: AC
Start: 2024-03-20 — End: ?

## 2024-03-20 NOTE — Assessment & Plan Note (Signed)
 Continue diet along with statin therapy  Labs obtained in office follow-up on cholesterol level

## 2024-03-20 NOTE — Assessment & Plan Note (Signed)
 Continue Ativan  2 mg q.h.s. PRN-refills given

## 2024-03-20 NOTE — Progress Notes (Signed)
 [facility]ICINE[facility]  [address]  [place] [address]  Operated by [facility]  Progress Note    Name: Christine Mcmillan MRN:  [id number]   Date: 03/20/2024 DOB:  May 13, 1946 (78 y.o.)              Chief Complaint: Follow Up and Hypertension   B12 shot    HPI: Christine Mcmillan is a 78 y.o. female who presents to the office today in follow up however has complaints of decreased hearing RT ear and thinks ear may be stopped up from wax. Denies any ear pain drainage.    F/u BP/HTN w reading stable at 122/78.  Pt is currently taking Norvasc 5mg  po daily along w bystolic  10 mg daily.  She denies chest pain headaches dizziness. She does have hx of some renal insuff w labs needed today. Referral to the urologist as well as nephrologist has been made however pt did not f/u as scheduled and appt never rescheduled.    F/u chol/lipids w Pravachol  80 mg q hs. Pt denies any SE or issues w med and f/u labs needed.     F/u abnormal glucose w last A1c stable at 5.6. pt does not check her BS at home currently but denies any SE or issues noted.    Follow-up chronic anxiety/insomnia for which patient uses Ativan  2 mg nightly.  Patient has went back and forth from several medications including Xanax  Valium  trazodone  and still has issues sleeping as well as anxiety specifically in the evenings.  She has used Lexapro as well as Effexor in the past for her depression but stopped medication after stating she did not feel like it was helping. She was seeing  Dr Daved Eriksson neurologist her bell's palsy who also recommended she be very careful about the Ativan  making her unsteady and falling which she denies ever having an issue with.  Sometime back did refer her to the pulmonologist have sleep studies but she did not comply with appointment.    Follow-up chronic cough for which she has had for numerous years and was referred to the pulmonologist however poorly compliant w appts.  She is prescription for  promethazine  DM  and prefers w codeine  noting it helps her sleep as well.  She has used meds for the chronic cough for multiple years.  She has had pneumonia in the past but currently no chest congestion shortness a breath wheezing noted.    F/u chronic back pain w recent MRI of lumbar spine showing moderate scoliosis, old T12 compression fx, moderate DJD and mild spinal stenosis. Pt has chronic back pain and uses Tylenol  #3  prn. A referral has also been made to neurosurgeon for eval but pt has not been seen yet.    Hx of chronic fatigue noted for which she has had B12 injections in the which helped so would like an injection today. She also has a hx of low K noted w occ cramps noted so will check K level.    Referred to Dr Alyssa Backbone also concerning her GERD issues as well as colonoscopy but pt did not go to appt and wants to wait at this time          Allergies:  Allergies   Allergen Reactions    Acetic Acid-Aluminum Acetate Swelling    Sertraline  Other Adverse Reaction (Add comment)     Headache         Current Medications:  amLODIPine  (NORVASC) 5  mg Oral Tablet, Take 1.5 Tablets (7.5 mg total) by mouth Once a day (Patient not taking: Reported on 03/20/2024)  ergocalciferol , vitamin D2, (DRISDOL ) 1,250 mcg (50,000 unit) Oral Capsule, Take 1 Capsule (50,000 Units total) by mouth Every 7 days  NALOXONE 4 MG/ACTUATION NASAL SPRAY, 1 Spray by INTRANASAL route Every 2 minutes as needed for actual or suspected opioid overdose. Call 911 if used.  promethazine -dextromethorphan (PHENERGAN -DM) 6.25-15 mg/5 mL Oral Syrup, Take 5 mL by mouth Four times a day as needed for Cough for up to 7 days  acetaminophen -codeine  (TYLENOL  #3) 300-30 mg Oral Tablet, Take 1 Tablet by mouth Three times a day  cyclobenzaprine  (FLEXERIL ) 10 mg Oral Tablet, Take 1 Tablet (10 mg total) by mouth Three times a day  furosemide  (LASIX ) 40 mg Oral Tablet, Take 1 Tablet (40 mg total) by mouth Once per day as needed for Other (Edema)  LORazepam  (ATIVAN )  2 mg Oral Tablet, Take 1 Tablet (2 mg total) by mouth Every night as needed for Anxiety  nebivoloL  (BYSTOLIC ) 10 mg Oral Tablet, Take 1 Tablet (10 mg total) by mouth Daily  nitrofurantoin  macrocrystaL (MACRODANTIN ) 50 mg Oral Capsule, Take 1 Capsule (50 mg total) by mouth Every night  omeprazole  (PRILOSEC) 20 mg Oral Capsule, Delayed Release(E.C.), Take 1 Capsule (20 mg total) by mouth Once a day  pravastatin  (PRAVACHOL ) 80 mg Oral Tablet, Take 1 Tablet (80 mg total) by mouth Daily    No facility-administered medications prior to visit.       Past Medical History:   Diagnosis Date    Abnormal renal function     Anxiety     Arthralgia     Bell's palsy     Chronic hypokalemia     COVID-19 vaccine series completed     Depression     Esophageal reflux     History of memory loss     History of recurrent UTIs     Hypertension            Social History     Tobacco Use    Smoking status: Never    Smokeless tobacco: Never   Substance Use Topics    Alcohol use: Never    Drug use: Never       OBJECTIVE:  Vitals:    03/20/24 1329   BP: 122/78   Pulse: 52   SpO2: 96%   Weight: 69.9 kg (154 lb)   Height: 1.524 m (5')   BMI: 30.08        Physical Exam  Vitals and nursing note reviewed.   HENT:      Head: Atraumatic.  No bruising hematoma asymmetry noted     Right Ear: +cerumen RT outer canal blocking TM     Left Ear: Tympanic membrane normal.      Mouth: Mucous membranes are moist.     Eyes:      Extraocular Movements: Extraocular movements intact.      Comments: +mild ptosis RT eye still noted from previous Bell's palsy  Cardiovascular:      Rate and Rhythm: Normal rate and regular rhythm.      Pulses: Normal pulses.   Anterior chest slightly tender on palpation  Pulmonary:      Effort: Pulmonary effort is normal.      Breath sounds: Normal breath sounds. No wheezing or rhonchi.   Abdominal:      General: Bowel sounds are normal.      Palpations: Abdomen is soft.  Tenderness: There is no abdominal tenderness. There is no  right CVA tenderness or left CVA tenderness.   Musculoskeletal:         General: No swelling. Normal range of motion.      Cervical back: Normal range of motion and neck supple.      Comments: +DJD bilat knees  Left knee > RT w crepitus noted and mild genu varum   Mild swelling left medial compartment  +decreased muscle tone all extrem  Decreased range of motion/flexion lower back due to pain  Gait stable  Skin:     General: Skin is warm.      Findings: No lesion or rash.      Comments: +scattered purpura bilat upper extrem   Neurological:      General: No focal deficit present.      Mental Status: She is alert and oriented to person, place, and time.      Cranial Nerves: No cranial nerve deficit.      Sensory: No sensory deficit.      Motor: No weakness.      Gait: Gait normal.   Psychiatric:         Mood and Affect: Mood normal.         Behavior: Behavior normal.         Thought Content: Thought content normal.         Judgment: Judgment normal.     Assessment & Plan  Excessive cerumen in ear canal, right  See procedure note concerning removal  Essential hypertension  Blood pressure stable continue current meds along with monitoring  Hyperlipidemia, unspecified hyperlipidemia type  Continue diet along with statin therapy  Labs obtained in office follow-up on cholesterol level  Abnormal glucose  Continue diet low sugar carb intake  A1c level obtained in lab today  Anxiety and depression  Continue Ativan  2 mg q.h.s. PRN-refills given  Insomnia, unspecified type    Chronic cough  Continue Prometh  DM p.r.n. cough  Chronic low back pain, unspecified back pain laterality, unspecified whether sciatica present  Continue Tylenol  No. 3 as directed p.r.n. pain-refills given  Chronic fatigue  B12 injection given  Chronic hypokalemia  Will check potassium and magnesium levels in lab  Patient noncompliance, general  Discussed compliance concerning medication as well as follow-up appointments in referrals       PLAN: Treatment  per orders . Call or return to clinic prn if these symptoms worsen or fail to improve as anticipated.  Orders Placed This Encounter    CANCELED: EAR CERUMEN REMOVAL ORDERABLE (AMB ONLY-PD)    EAR CERUMEN REMOVAL ORDERABLE (AMB ONLY-PD)    URINE CULTURE,ROUTINE    CBC/DIFF    COMPREHENSIVE METABOLIC PNL, FASTING    HGA1C (HEMOGLOBIN A1C WITH EST AVG GLUCOSE)    URINALYSIS, MACROSCOPIC AND MICROSCOPIC W/CULTURE REFLEX    LIPID PANEL    THYROID  STIMULATING HORMONE WITH FREE T4 REFLEX    MAGNESIUM    CBC WITH DIFF    URINALYSIS, MACROSCOPIC    URINALYSIS, MICROSCOPIC    acetaminophen -codeine  (TYLENOL  #3) 300-30 mg Oral Tablet    LORazepam  (ATIVAN ) 2 mg Oral Tablet    nitrofurantoin  macrocrystaL (MACRODANTIN ) 50 mg Oral Capsule    nebivoloL  (BYSTOLIC ) 10 mg Oral Tablet    omeprazole  (PRILOSEC) 20 mg Oral Capsule, Delayed Release(E.C.)    cyclobenzaprine  (FLEXERIL ) 10 mg Oral Tablet    furosemide  (LASIX ) 40 mg Oral Tablet    pravastatin  (PRAVACHOL ) 80 mg Oral Tablet  diclofenac  sodium (VOLTAREN) 1 % Gel    cyanocobalamin  (VITAMIN B12) 1000 mcg/mL injection      Post-Discharge Follow Up Appointments       Wednesday Apr 18, 2024    Return Patient Visit with Cloyce Darby, PA-C at  1:15 PM      Thursday May 17, 2024    Return Patient Visit with Cloyce Darby, PA-C at  1:30 PM      Wednesday Jun 13, 2024    Return Patient Visit with Cloyce Darby, PA-C at  1:00 PM      Thursday Jul 12, 2024    Return Patient Visit with Cloyce Darby, PA-C at  1:30 PM      Wednesday Nov 28, 2024    Return Telephone Visit with Cloyce Darby, PA-C at  1:00 PM      Internal Medicine, [facility]  [facility], [place]  [address]  [place] [address]  [phone]                This note was partially created using MModal Fluency Direct system (voice recognition software) and is inherently subject to errors including those of syntax and "sound-alike" substitutions which may escape proofreading.  In such instances, original meaning may be  extrapolated by contextual derivation.    Seanmichael Salmons, PA-C

## 2024-03-20 NOTE — Procedures (Signed)
 INTERNAL MEDICINE, BUILDING A  [address]  [place] [address]  Operated by [facility]  Procedure Note    Name: SUNJAI LEVANDOSKI MRN:  [id number]   Date: 03/20/2024 DOB:  12/25/1945 (78 y.o.)         EAR CERUMEN REMOVAL ORDERABLE (AMB ONLY-PD)    Performed by: Cloyce Darby, PA-C  Authorized by: Cloyce Darby, PA-C    Consent:     Consent obtained:  Verbal    Consent given by:  Patient    Risks, benefits, and alternatives were discussed: yes      Risks discussed:  Bleeding, dizziness, incomplete removal, infection, pain and TM perforation    Alternatives discussed:  Observation and referral  Universal protocol:     Patient identity confirmed:  Verbally with patient  Procedure details:     Location:  R ear    Procedure type: irrigation      Procedure outcomes: cerumen removed    Post-procedure details:     Inspection:  TM intact    Hearing quality:  Normal    Procedure completion:  Tolerated well, no immediate complications      Omolara Carol, PA-C

## 2024-03-20 NOTE — Nursing Note (Signed)
 Patient is here for 1 month follow up for hypertension. Refills

## 2024-03-20 NOTE — Assessment & Plan Note (Signed)
 Continue Prometh  DM p.r.n. cough

## 2024-03-20 NOTE — Assessment & Plan Note (Signed)
 Blood pressure stable continue current meds along with monitoring

## 2024-03-20 NOTE — Assessment & Plan Note (Signed)
 Continue Tylenol  No. 3 as directed p.r.n. pain-refills given

## 2024-03-20 NOTE — Assessment & Plan Note (Signed)
 B12 injection given

## 2024-03-20 NOTE — Assessment & Plan Note (Signed)
 Will check potassium and magnesium levels in lab

## 2024-03-20 NOTE — Assessment & Plan Note (Signed)
 Continue diet low sugar carb intake  A1c level obtained in lab today

## 2024-03-20 NOTE — Assessment & Plan Note (Signed)
 Discussed compliance concerning medication as well as follow-up appointments in referrals

## 2024-03-21 ENCOUNTER — Ambulatory Visit (INDEPENDENT_AMBULATORY_CARE_PROVIDER_SITE_OTHER): Payer: Self-pay

## 2024-03-21 LAB — COMPREHENSIVE METABOLIC PNL, FASTING
ALBUMIN/GLOBULIN RATIO: 1.8 — ABNORMAL HIGH (ref 0.8–1.4)
ALBUMIN: 4.6 g/dL (ref 3.5–5.7)
ALKALINE PHOSPHATASE: 68 U/L (ref 34–104)
ALT (SGPT): 18 U/L (ref 7–52)
ANION GAP: 8 mmol/L (ref 4–13)
AST (SGOT): 29 U/L (ref 13–39)
BILIRUBIN TOTAL: 0.7 mg/dL (ref 0.3–1.0)
BUN/CREA RATIO: 19 (ref 6–22)
BUN: 18 mg/dL (ref 7–25)
CALCIUM, CORRECTED: 9 mg/dL (ref 8.9–10.8)
CALCIUM: 9.5 mg/dL (ref 8.6–10.3)
CHLORIDE: 106 mmol/L (ref 98–107)
CO2 TOTAL: 25 mmol/L (ref 21–31)
CREATININE: 0.93 mg/dL (ref 0.60–1.30)
ESTIMATED GFR: 63 mL/min/{1.73_m2} (ref >59)
GLOBULIN: 2.5 (ref 2.0–3.5)
GLUCOSE: 90 mg/dL (ref 74–109)
OSMOLALITY, CALCULATED: 279 mosm/kg (ref 270–290)
POTASSIUM: 4.4 mmol/L (ref 3.5–5.1)
PROTEIN TOTAL: 7.1 g/dL (ref 6.4–8.9)
SODIUM: 139 mmol/L (ref 136–145)

## 2024-03-21 LAB — LIPID PANEL
CHOL/HDL RATIO: 4.5
CHOLESTEROL: 216 mg/dL — ABNORMAL HIGH (ref <200)
HDL CHOL: 48 mg/dL (ref >=40)
LDL CALC: 130 mg/dL — ABNORMAL HIGH (ref 0–100)
TRIGLYCERIDES: 188 mg/dL — ABNORMAL HIGH (ref <=150)
VLDL CALC: 38 mg/dL (ref 0–50)

## 2024-03-21 LAB — THYROID STIMULATING HORMONE WITH FREE T4 REFLEX: TSH: 1.952 u[IU]/mL (ref 0.450–5.330)

## 2024-03-21 LAB — MAGNESIUM: MAGNESIUM: 2 mg/dL (ref 1.9–2.7)

## 2024-03-22 LAB — URINE CULTURE,ROUTINE: URINE CULTURE: NO GROWTH — AB

## 2024-04-02 MED ORDER — CEPHALEXIN 500 MG CAPSULE
500.0000 mg | ORAL_CAPSULE | Freq: Four times a day (QID) | ORAL | 0 refills | Status: AC
Start: 2024-04-02 — End: 2024-04-09

## 2024-04-11 ENCOUNTER — Ambulatory Visit: Payer: Medicare Other | Admitting: Physician Assistant

## 2024-04-15 ENCOUNTER — Other Ambulatory Visit (INDEPENDENT_AMBULATORY_CARE_PROVIDER_SITE_OTHER): Payer: Self-pay | Admitting: Physician Assistant

## 2024-04-18 ENCOUNTER — Other Ambulatory Visit: Payer: Self-pay

## 2024-04-18 ENCOUNTER — Ambulatory Visit: Attending: Physician Assistant | Admitting: Physician Assistant

## 2024-04-18 ENCOUNTER — Encounter (INDEPENDENT_AMBULATORY_CARE_PROVIDER_SITE_OTHER): Payer: Self-pay | Admitting: Physician Assistant

## 2024-04-18 DIAGNOSIS — I10 Essential (primary) hypertension: Secondary | ICD-10-CM

## 2024-04-18 DIAGNOSIS — R053 Chronic cough: Secondary | ICD-10-CM

## 2024-04-18 DIAGNOSIS — F39 Unspecified mood [affective] disorder: Secondary | ICD-10-CM

## 2024-04-18 DIAGNOSIS — M545 Low back pain, unspecified: Secondary | ICD-10-CM

## 2024-04-18 DIAGNOSIS — G8929 Other chronic pain: Secondary | ICD-10-CM

## 2024-04-18 MED ORDER — LORAZEPAM 2 MG TABLET
2.0000 mg | ORAL_TABLET | Freq: Every evening | ORAL | 2 refills | Status: DC | PRN
Start: 2024-04-18 — End: 2024-05-17

## 2024-04-18 MED ORDER — CYCLOBENZAPRINE 10 MG TABLET
10.0000 mg | ORAL_TABLET | Freq: Three times a day (TID) | ORAL | 1 refills | Status: DC
Start: 2024-04-18 — End: 2024-05-17

## 2024-04-18 MED ORDER — NEBIVOLOL 10 MG TABLET
10.0000 mg | ORAL_TABLET | Freq: Every day | ORAL | 1 refills | Status: DC
Start: 2024-04-18 — End: 2024-05-17

## 2024-04-18 MED ORDER — ACETAMINOPHEN 300 MG-CODEINE 30 MG TABLET
1.0000 | ORAL_TABLET | Freq: Three times a day (TID) | ORAL | 0 refills | Status: DC
Start: 2024-04-18 — End: 2024-05-17

## 2024-04-18 MED ORDER — PRAVASTATIN 80 MG TABLET
80.0000 mg | ORAL_TABLET | Freq: Every day | ORAL | 1 refills | Status: DC
Start: 2024-04-18 — End: 2024-05-17

## 2024-04-18 MED ORDER — PROMETHAZINE-DM 6.25 MG-15 MG/5 ML ORAL SYRUP
5.0000 mL | ORAL_SOLUTION | Freq: Four times a day (QID) | ORAL | 0 refills | Status: DC | PRN
Start: 2024-04-18 — End: 2024-05-17

## 2024-04-18 NOTE — Assessment & Plan Note (Signed)
 Continue Tylenol  No. 3 as directed p.r.n. pain-refills given

## 2024-04-18 NOTE — Assessment & Plan Note (Signed)
 Continue ativan  2mg  nightly

## 2024-04-18 NOTE — Assessment & Plan Note (Signed)
 Blood pressure stable continue current meds along with monitoring

## 2024-04-18 NOTE — Assessment & Plan Note (Signed)
 Continue Prometh  DM p.r.n. cough

## 2024-04-18 NOTE — Progress Notes (Signed)
 [facility]  [address]  [place] [address]        Telephone Visit    Name:  Christine Mcmillan MRN: [id number]   Date:  04/18/2024 Age:   78 y.o.     The patient/family initiated a request for telephone service.  Verbal consent for this service was obtained from the patient/family.    TELEMEDICINE DOCUMENTATION:    [facility]    Patient/family aware of provider location:  yes  Patient/family consent for telemedicine:  yes  Examination observed and performed by:  Greig Seats, PA-C    Chief Complaint   Patient presents with    Medication Refill     Follow up        Call notes:  Christine Mcmillan is a 78 y.o. female being seen via TeleMed  for f/u  Concerning BP/hypertension for which she states has been good when checked.  Reports she is feeling well overall and continues to take her Bystolic  10 mg daily along with Norvasc 7.5 mg daily which she tolerates well.  Denies headaches dizziness chest pain.    Follow-up chronic anxiety for which she takes Ativan  p.r.n. and needs refills also.  At this time anxiety is stable at current dose and she denies panic attacks or new issues.    Follow-up chronic back pain/osteoarthritis for which patient uses Tylenol  3 p.r.n. pain.  She has a lot of other joint pain including hip knee hands.  Patient states Tylenol  3 does help her pain and she also uses topical Pennsaid as needed.  No side effects or issues with medication noted and refills on med requested.  She continues to follow with orthopedic Dr Joesph concerning her knee pain.    Follow-up chronic cough complaints for which patient uses promethazine  cough medication p.r.n.SABRA  She states her allergies has been worse with the trees and flowers blooming so cough is increased.  She denies any shortness a breath wheezing however.  Requesting medication refill on cough med.      Current meds:     acetaminophen -codeine  (TYLENOL  #3) 300-30 mg Oral Tablet Take 1 Tablet by mouth Three  times a day    amLODIPine  (NORVASC) 5 mg Oral Tablet Take 1.5 Tablets (7.5 mg total) by mouth Once a day (Patient not taking: Reported on 03/20/2024)    cyclobenzaprine  (FLEXERIL ) 10 mg Oral Tablet Take 1 Tablet (10 mg total) by mouth Three times a day    diclofenac  sodium (VOLTAREN) 1 % Gel Apply topically Four times a day for 90 days    ergocalciferol , vitamin D2, (DRISDOL ) 1,250 mcg (50,000 unit) Oral Capsule Take 1 Capsule (50,000 Units total) by mouth Every 7 days    furosemide  (LASIX ) 40 mg Oral Tablet Take 1 Tablet (40 mg total) by mouth Once per day as needed for Other (Edema)    LORazepam  (ATIVAN ) 2 mg Oral Tablet Take 1 Tablet (2 mg total) by mouth Every night as needed for Anxiety    NALOXONE 4 MG/ACTUATION NASAL SPRAY 1 Spray by INTRANASAL route Every 2 minutes as needed for actual or suspected opioid overdose. Call 911 if used.    nebivoloL  (BYSTOLIC ) 10 mg Oral Tablet Take 1 Tablet (10 mg total) by mouth Daily    nitrofurantoin  macrocrystaL (MACRODANTIN ) 50 mg Oral Capsule Take 1 Capsule (50 mg total) by mouth Every night    omeprazole  (PRILOSEC) 20 mg Oral Capsule, Delayed Release(E.C.) Take 1 Capsule (20 mg total) by mouth Daily  pravastatin  (PRAVACHOL ) 80 mg Oral Tablet Take 1 Tablet (80 mg total) by mouth Daily    promethazine -dextromethorphan (PHENERGAN -DM) 6.25-15 mg/5 mL Oral Syrup Take 5 mL by mouth Four times a day as needed for Cough for up to 7 days        Allergies   Allergen Reactions    Acetic Acid-Aluminum Acetate Swelling    Sertraline  Other Adverse Reaction (Add comment)     Headache          Past Medical History:   Diagnosis Date    Abnormal renal function     Anxiety     Arthralgia     Bell's palsy     Chronic hypokalemia     COVID-19 vaccine series completed     Depression     Esophageal reflux     History of memory loss     History of recurrent UTIs     Hypertension         Social History     Socioeconomic History    Marital status: Divorced   Tobacco Use    Smoking status: Never     Smokeless tobacco: Never   Substance and Sexual Activity    Alcohol use: Never    Drug use: Never     Social Determinants of Health     Financial Resource Strain: Low Risk  (07/22/2022)    Financial Resource Strain     SDOH Financial: No   Transportation Needs: Low Risk  (07/22/2022)    Transportation Needs     SDOH Transportation: No   Social Connections: Low Risk  (07/22/2022)    Social Connections     SDOH Social Isolation: 5 or more times a week   Intimate Partner Violence: Low Risk  (07/22/2022)    Intimate Partner Violence     SDOH Domestic Violence: No   Housing Stability: Low Risk  (07/22/2022)    Housing Stability     SDOH Housing Situation: I have housing.     SDOH Housing Worry: No        Past Surgical History:   Procedure Laterality Date    BRAIN TUMOR EXCISION      HX LIPOMA RESECTION N/A     Lipoma on back        Family Medical History:       Problem Relation (Age of Onset)    Cancer Mother             Physical Exam  Constitutional:       Comments: Patient able to relay history today without issue   Pulmonary:      Effort: Pulmonary effort is normal.   Neurological:      Mental Status: She is alert.   Psychiatric:         Mood and Affect: Mood normal.      Assessment & Plan  Essential hypertension  Blood pressure stable continue current meds along with monitoring  Mood disorder (CMS HCC)  Continue ativan  2mg  nightly  Chronic low back pain, unspecified back pain laterality, unspecified whether sciatica present  Continue Tylenol  No. 3 as directed p.r.n. pain-refills given  Chronic cough  Continue Prometh  DM p.r.n. cough     Total provider time spent with the patient on the phone: 16  minutes.    Post-Discharge Follow Up Appointments       Thursday May 17, 2024    Return Patient Visit with Scharlene, Basilio Meadow, PA-C at  1:30 PM  Wednesday Jun 13, 2024    Return Patient Visit with Scharlene No, PA-C at  1:00 PM      Thursday Jul 12, 2024    Return Patient Visit with Scharlene No, PA-C at  1:30 PM      Wednesday Nov 28, 2024    Return Telephone Visit with Scharlene No, PA-C at  1:00 PM      Internal Medicine, Building [facility]  [facility]  [address]  [place] [address]  [phone]              This note was partially created using MModal Fluency Direct system (voice recognition software) and is inherently subject to errors including those of syntax and sound-alike substitutions which may escape proofreading.  In such instances, original meaning may be extrapolated by contextual derivation.    Haylin Camilli, PA-C

## 2024-05-09 ENCOUNTER — Ambulatory Visit (INDEPENDENT_AMBULATORY_CARE_PROVIDER_SITE_OTHER): Payer: Self-pay | Admitting: Physician Assistant

## 2024-05-15 ENCOUNTER — Other Ambulatory Visit (INDEPENDENT_AMBULATORY_CARE_PROVIDER_SITE_OTHER): Payer: Self-pay | Admitting: Physician Assistant

## 2024-05-17 ENCOUNTER — Ambulatory Visit: Payer: Self-pay | Attending: Physician Assistant | Admitting: Physician Assistant

## 2024-05-17 ENCOUNTER — Other Ambulatory Visit: Payer: Self-pay

## 2024-05-17 ENCOUNTER — Encounter (INDEPENDENT_AMBULATORY_CARE_PROVIDER_SITE_OTHER): Payer: Self-pay | Admitting: Physician Assistant

## 2024-05-17 VITALS — BP 134/88 | HR 63 | Ht 60.0 in | Wt 149.0 lb

## 2024-05-17 DIAGNOSIS — R7309 Other abnormal glucose: Secondary | ICD-10-CM | POA: Insufficient documentation

## 2024-05-17 DIAGNOSIS — M545 Low back pain, unspecified: Secondary | ICD-10-CM | POA: Insufficient documentation

## 2024-05-17 DIAGNOSIS — F32A Depression, unspecified: Secondary | ICD-10-CM | POA: Insufficient documentation

## 2024-05-17 DIAGNOSIS — I10 Essential (primary) hypertension: Secondary | ICD-10-CM | POA: Insufficient documentation

## 2024-05-17 DIAGNOSIS — F419 Anxiety disorder, unspecified: Secondary | ICD-10-CM | POA: Insufficient documentation

## 2024-05-17 DIAGNOSIS — G4700 Insomnia, unspecified: Secondary | ICD-10-CM | POA: Insufficient documentation

## 2024-05-17 DIAGNOSIS — R053 Chronic cough: Secondary | ICD-10-CM | POA: Insufficient documentation

## 2024-05-17 DIAGNOSIS — M25561 Pain in right knee: Secondary | ICD-10-CM | POA: Insufficient documentation

## 2024-05-17 DIAGNOSIS — D582 Other hemoglobinopathies: Secondary | ICD-10-CM | POA: Insufficient documentation

## 2024-05-17 DIAGNOSIS — M25512 Pain in left shoulder: Secondary | ICD-10-CM | POA: Insufficient documentation

## 2024-05-17 DIAGNOSIS — Z9181 History of falling: Secondary | ICD-10-CM | POA: Insufficient documentation

## 2024-05-17 DIAGNOSIS — R5382 Chronic fatigue, unspecified: Secondary | ICD-10-CM | POA: Insufficient documentation

## 2024-05-17 DIAGNOSIS — M25562 Pain in left knee: Secondary | ICD-10-CM | POA: Insufficient documentation

## 2024-05-17 DIAGNOSIS — G8929 Other chronic pain: Secondary | ICD-10-CM | POA: Insufficient documentation

## 2024-05-17 MED ORDER — CYANOCOBALAMIN (VIT B-12) 1,000 MCG/ML INJECTION SOLUTION
1000.0000 ug | INTRAMUSCULAR | Status: AC
Start: 2024-05-17 — End: 2024-05-17
  Administered 2024-05-17: 1000 ug via SUBCUTANEOUS

## 2024-05-17 MED ORDER — ERGOCALCIFEROL (VITAMIN D2) 1,250 MCG (50,000 UNIT) CAPSULE
50000.0000 [IU] | ORAL_CAPSULE | ORAL | 2 refills | Status: DC
Start: 2024-05-17 — End: 2024-09-13

## 2024-05-17 MED ORDER — LORAZEPAM 2 MG TABLET
2.0000 mg | ORAL_TABLET | Freq: Every evening | ORAL | 2 refills | Status: DC | PRN
Start: 2024-05-17 — End: 2024-07-19

## 2024-05-17 MED ORDER — ACETAMINOPHEN 300 MG-CODEINE 30 MG TABLET
1.0000 | ORAL_TABLET | Freq: Three times a day (TID) | ORAL | 0 refills | Status: DC
Start: 2024-05-17 — End: 2024-06-19

## 2024-05-17 MED ORDER — PROMETHAZINE-DM 6.25 MG-15 MG/5 ML ORAL SYRUP
5.0000 mL | ORAL_SOLUTION | Freq: Four times a day (QID) | ORAL | 0 refills | Status: DC | PRN
Start: 2024-05-17 — End: 2024-06-19

## 2024-05-17 MED ORDER — CYCLOBENZAPRINE 10 MG TABLET
10.0000 mg | ORAL_TABLET | Freq: Three times a day (TID) | ORAL | 1 refills | Status: DC
Start: 2024-05-17 — End: 2024-07-12

## 2024-05-17 MED ORDER — TRIAMCINOLONE ACETONIDE 40 MG/ML SUSPENSION FOR INJECTION
40.0000 mg | INTRAMUSCULAR | Status: AC
Start: 2024-05-17 — End: 2024-05-17
  Administered 2024-05-17: 40 mg via INTRAMUSCULAR

## 2024-05-17 MED ORDER — PRAVASTATIN 80 MG TABLET
80.0000 mg | ORAL_TABLET | Freq: Every day | ORAL | 1 refills | Status: DC
Start: 2024-05-17 — End: 2024-08-16

## 2024-05-17 MED ORDER — NEBIVOLOL 10 MG TABLET
10.0000 mg | ORAL_TABLET | Freq: Every day | ORAL | 1 refills | Status: DC
Start: 2024-05-17 — End: 2024-09-13

## 2024-05-17 NOTE — Assessment & Plan Note (Signed)
 Continue Ativan  2 mg q.h.s. PRN-refills given

## 2024-05-17 NOTE — Assessment & Plan Note (Signed)
 Continue Bystolic  10 mg daily along with blood pressure monitoring  Patient will notify me if issues concerning elevated blood pressure noted

## 2024-05-17 NOTE — Assessment & Plan Note (Signed)
 B12 injection given

## 2024-05-17 NOTE — Progress Notes (Signed)
 [facility]  [address]  [place] [address]  Operated by [facility]  Progress Note    Name: Christine Mcmillan MRN:  [id number]   Date: 05/17/2024 DOB:  Sep 06, 1946 (78 y.o.)              Chief Complaint: Follow Up (Routine 1 month ) and Medication Refill   B12 shot    This note was created with assistance from [employer] via capture of conversational audio.  Consent was obtained from the patient prior to recording.      History of Present Illness  Christine Mcmillan is a 78 year old female who presents for her routine follow-up however reports a fall with shoulder pain as well as bilateral knee pain and difficulty walking.     She experienced a fall on Monday, losing her balance and falling backwards at which time impact was on her left side and shoulder area.  Patient states after the fall she had soreness in her left lateral breast /ribs as well as left shoulder. The soreness in her breast and rib area has improved as well as the pain in her shoulder but she still notes that raising her shoulder above her head is difficult at times due to the increased pain.  There was no numbness tingling of the arm however noted.    She has been experiencing significant knee pain for some time for which she has been following with the orthopedic.  Patient states the knee pain does affect her mobility in his has been mentioned by the orthopedic the need sometime down the road to replace her knees but right now he is trying to get the rooster shots her by her insurance.  At this time no redness swelling or warmth of the knees are noted.    Patient has a history of hypertension and today blood pressure initially elevated at 148/92 mmHg however repeat 134/88. She is currently taking bisoprolol 10 mg, which she tolerates well however previously been on Norvasc but it caused leg swelling so she stopped medication.  She denies any headaches dizziness chest pain.    She has a history of  elevated hemoglobin levels, with a recent measurement of 15.8 g/dL. No headaches, nausea, or shortness of breath. She has been seen by hematologist and referral was discussed with the patient is wanting to recheck labs in hold off on referral at this time.    F/u abnormal glucose w last A1c stable at 5.8. pt does not check her BS at home currently but denies any SE or issues noted.    Follow-up chronic anxiety/insomnia for which patient uses Ativan  2 mg nightly.  Patient has went back and forth from several medications including Xanax  Valium  trazodone  and still has issues sleeping as well as anxiety specifically in the evenings.  She has used Lexapro as well as Effexor in the past for her depression but stopped medication after stating she did not feel like it was helping. She was seeing  Dr Sharron neurologist her bell's palsy who also recommended she be very careful about the Ativan  making her unsteady and falling which she denies ever having an issue with.  Sometime back did refer her to the pulmonologist have sleep studies but she did not comply with appointment.    Follow-up chronic cough for which she has had for numerous years and was referred to the pulmonologist however poorly compliant w appts.  She is prescription for promethazine  DM  and  prefers w codeine  noting it helps her sleep as well.  She has used meds for the chronic cough for multiple years.  She has had pneumonia in the past but currently no chest congestion shortness a breath wheezing noted.    F/u chronic back pain w recent MRI of lumbar spine showing moderate scoliosis, old T12 compression fx, moderate DJD and mild spinal stenosis. Pt has chronic back pain and uses Tylenol  #3  prn. A referral has also been made to neurosurgeon for eval but pt has not been seen yet.    Hx of chronic fatigue noted for which she has had B12 injections in the which helped so would like an injection today. She also has a hx of low K noted w occ cramps noted so will  check K level.    Referred to Dr MARLA Blanch also concerning her GERD issues as well as colonoscopy but pt did not go to appt and wants to wait at this time          Allergies:  Allergies   Allergen Reactions    Acetic Acid-Aluminum Acetate Swelling    Sertraline  Other Adverse Reaction (Add comment)     Headache         Current Medications:  diclofenac  sodium (VOLTAREN) 1 % Gel, Apply topically Four times a day for 90 days  furosemide  (LASIX ) 40 mg Oral Tablet, Take 1 Tablet (40 mg total) by mouth Once per day as needed for Other (Edema)  NALOXONE 4 MG/ACTUATION NASAL SPRAY, 1 Spray by INTRANASAL route Every 2 minutes as needed for actual or suspected opioid overdose. Call 911 if used.  nitrofurantoin  macrocrystaL (MACRODANTIN ) 50 mg Oral Capsule, Take 1 Capsule (50 mg total) by mouth Every night  omeprazole  (PRILOSEC) 20 mg Oral Capsule, Delayed Release(E.C.), Take 1 Capsule (20 mg total) by mouth Daily  acetaminophen -codeine  (TYLENOL  #3) 300-30 mg Oral Tablet, Take 1 Tablet by mouth Three times a day  amLODIPine  (NORVASC) 5 mg Oral Tablet, Take 1.5 Tablets (7.5 mg total) by mouth Once a day (Patient not taking: Reported on 03/20/2024)  cyclobenzaprine  (FLEXERIL ) 10 mg Oral Tablet, Take 1 Tablet (10 mg total) by mouth Three times a day  ergocalciferol , vitamin D2, (DRISDOL ) 1,250 mcg (50,000 unit) Oral Capsule, Take 1 Capsule (50,000 Units total) by mouth Every 7 days  LORazepam  (ATIVAN ) 2 mg Oral Tablet, Take 1 Tablet (2 mg total) by mouth Every night as needed for Anxiety  nebivoloL  (BYSTOLIC ) 10 mg Oral Tablet, Take 1 Tablet (10 mg total) by mouth Daily  pravastatin  (PRAVACHOL ) 80 mg Oral Tablet, Take 1 Tablet (80 mg total) by mouth Daily  promethazine -dextromethorphan (PHENERGAN -DM) 6.25-15 mg/5 mL Oral Syrup, Take 5 mL by mouth Four times a day as needed for Cough for up to 7 days    No facility-administered medications prior to visit.       Past Medical History:   Diagnosis Date    Abnormal renal function      Anxiety     Arthralgia     Bell's palsy     Chronic hypokalemia     COVID-19 vaccine series completed     Depression     Esophageal reflux     History of memory loss     History of recurrent UTIs     Hypertension            Social History     Tobacco Use    Smoking status: Never    Smokeless tobacco:  Never   Substance Use Topics    Alcohol use: Never    Drug use: Never       OBJECTIVE:  Vitals:    05/17/24 1321 05/17/24 1351   BP: (!) 148/92 134/88   Pulse: 63    SpO2: 97%    Weight: 67.6 kg (149 lb)    Height: 1.524 m (5')    BMI: 29.1           Physical Exam  Vitals and nursing note reviewed.   HENT:      Head: Atraumatic.  No bruising hematoma asymmetry noted     Right Ear:  Mild cerumen external canal     Left Ear: Tympanic membrane normal.      Mouth: Mucous membranes are moist.     Eyes:      Extraocular Movements: Extraocular movements intact.      Comments: +mild ptosis RT eye still noted from previous Bell's palsy  Cardiovascular:      Rate and Rhythm: Normal rate and regular rhythm.      Pulses: Normal pulses.   Anterior chest slightly tender on palpation  Pulmonary:      Effort: Pulmonary effort is normal.      Breath sounds: Normal breath sounds. No wheezing or rhonchi.   No tenderness over left lateral rib breast area noted  Abdominal:      General: Bowel sounds are normal.      Palpations: Abdomen is soft.      Tenderness: There is no abdominal tenderness. There is no right CVA tenderness or left CVA tenderness.   Musculoskeletal:         General: No swelling. Normal range of motion.      Cervical back: Normal range of motion and neck supple.      Comments: +DJD bilat knees  Left knee > RT w crepitus noted and mild genu varum   Mild swelling left medial compartment  Decreased abduction left shoulder due to pain with mild tenderness on palpation left lateral deltoid  +decreased muscle tone all extrem  Decreased range of motion/flexion lower back due to pain  Gait stable  Skin:     General: Skin is warm.       Findings: No lesion or rash.      Comments: +scattered purpura bilat upper extrem   Neurological:      General: No focal deficit present.      Mental Status: She is alert and oriented to person, place, and time.      Cranial Nerves: No cranial nerve deficit.      Sensory: No sensory deficit.      Motor: No weakness.      Gait: Gait normal.   Psychiatric:         Mood and Affect: Mood normal.         Behavior: Behavior normal.         Thought Content: Thought content normal.         Judgment: Judgment normal.     Assessment & Plan  Acute pain of left shoulder  Kenalog  injection given x 1   May use heat /NSAID prn  Hx of fall  Fall precautions discussed  Chronic pain of both knees  Knee osteoarthritis  Significant knee pain affecting mobility. Awaiting Medicare approval for hyaluronic acid injections. Possible knee replacement if injections fail.  -continue follow up w ortho as scheduled  Continue tylenol  # 3 prn  - Await Medicare approval for  hyaluronic acid injections.  - Consider knee replacement if injections are ineffective.  Elevated hemoglobin (CMS HCC)  Polycythemia  Hemoglobin elevated at 15.8 g/dL. No symptoms. No hematologist consultation yet.  - Monitor hemoglobin levels.  - Consider referral to hematologist if hemoglobin remains elevated.  Essential hypertension  Continue Bystolic  10 mg daily along with blood pressure monitoring  Patient will notify me if issues concerning elevated blood pressure noted  Abnormal glucose  A1c stable  Continue diet along with blood sugar monitoring  Anxiety and depression  Continue Ativan  2 mg q.h.s. PRN-refills given  Insomnia, unspecified type  Continue ativan  2mg  q hs  Chronic fatigue  B12 injection given  Chronic cough  Continue Prometh  DM p.r.n. cough  Chronic low back pain, unspecified back pain laterality, unspecified whether sciatica present  Continue Tylenol  No. 3 p.r.n. pain     Assessment & Plan       Orders Placed This Encounter    triamcinolone  acetonide  (KENALOG -40) 40 mg/mL injection    cyanocobalamin  (VITAMIN B12) 1000 mcg/mL injection    acetaminophen -codeine  (TYLENOL  #3) 300-30 mg Oral Tablet    LORazepam  (ATIVAN ) 2 mg Oral Tablet    promethazine -dextromethorphan (PHENERGAN -DM) 6.25-15 mg/5 mL Oral Syrup    ergocalciferol , vitamin D2, (DRISDOL ) 1,250 mcg (50,000 unit) Oral Capsule    pravastatin  (PRAVACHOL ) 80 mg Oral Tablet    cyclobenzaprine  (FLEXERIL ) 10 mg Oral Tablet    nebivoloL  (BYSTOLIC ) 10 mg Oral Tablet      Post-Discharge Follow Up Appointments       Wednesday Jun 13, 2024    Return Patient Visit with Scharlene No, PA-C at  1:00 PM      Thursday Jul 12, 2024    Return Patient Visit with Scharlene No, PA-C at  1:30 PM      Wednesday Nov 28, 2024    Return Telephone Visit with Scharlene No, PA-C at  1:00 PM      Internal Medicine, [facility]  [facility], [place]  [address]  [place] [address]  [phone]                   This note was partially created using MModal Fluency Direct system (voice recognition software) and is inherently subject to errors including those of syntax and sound-alike substitutions which may escape proofreading.  In such instances, original meaning may be extrapolated by contextual derivation.    Sinaya Minogue, PA-C

## 2024-05-17 NOTE — Patient Instructions (Signed)
 VISIT SUMMARY:  Today, you were seen for knee pain and difficulty walking, along with elevated blood pressure and a recent fall. We also reviewed your history of elevated hemoglobin levels and Bell's palsy.    YOUR PLAN:  -ESSENTIAL HYPERTENSION: Essential hypertension means your blood pressure is consistently higher than normal. Your blood pressure was 148/92 mmHg today. Since you are not taking amlodipine  due to side effects, you are currently on nebivolol  10 mg daily. We will check your blood pressure again and may increase the nebivolol  to 20 mg if it remains elevated.    -POLYCYTHEMIA: Polycythemia is a condition where you have a higher than normal level of hemoglobin in your blood. Your recent hemoglobin level was 15.8 g/dL. We will continue to monitor your hemoglobin levels and may refer you to a hematologist if it remains elevated.    -KNEE OSTEOARTHRITIS: Knee osteoarthritis is a condition where the cartilage in your knee joint wears down over time, causing pain and stiffness. Your knee pain is affecting your mobility, and we are awaiting Medicare approval for hyaluronic acid injections. If these injections are not effective, we may consider a knee replacement.    -FALL WITH MINOR INJURIES: You recently experienced a fall that caused soreness in your breast and shoulder, but there were no fractures. You declined a steroid injection for the soreness.    INSTRUCTIONS:  Please monitor your blood pressure at home and keep a log of your readings. Follow up with us  if your blood pressure remains high or if you experience any new symptoms. We will also continue to monitor your hemoglobin levels and will discuss a referral to a hematologist if necessary. Await Medicare approval for the hyaluronic acid injections for your knee, and consider the possibility of knee replacement if the injections do not help. Be cautious to avoid falls, and let us  know if you have any new or worsening symptoms.

## 2024-05-17 NOTE — Assessment & Plan Note (Signed)
 A1c stable  Continue diet along with blood sugar monitoring

## 2024-05-17 NOTE — Assessment & Plan Note (Signed)
 Continue Tylenol  No. 3 p.r.n. pain

## 2024-05-17 NOTE — Assessment & Plan Note (Signed)
 Continue Prometh  DM p.r.n. cough

## 2024-05-17 NOTE — Assessment & Plan Note (Signed)
Continue ativan 2mg  q hs

## 2024-05-17 NOTE — Nursing Note (Signed)
 Patient is here for routine 1 month follow up for medication refills. Patient states that she fell the other Christine Mcmillan and that her left shoulder/arm is hurting. Patient requests a B12 injection today.

## 2024-06-06 ENCOUNTER — Ambulatory Visit (INDEPENDENT_AMBULATORY_CARE_PROVIDER_SITE_OTHER): Payer: Self-pay | Admitting: Physician Assistant

## 2024-06-13 ENCOUNTER — Ambulatory Visit (INDEPENDENT_AMBULATORY_CARE_PROVIDER_SITE_OTHER): Payer: Self-pay | Admitting: Physician Assistant

## 2024-06-19 ENCOUNTER — Ambulatory Visit: Payer: Self-pay | Attending: Physician Assistant | Admitting: Physician Assistant

## 2024-06-19 ENCOUNTER — Other Ambulatory Visit: Payer: Self-pay

## 2024-06-19 ENCOUNTER — Encounter (INDEPENDENT_AMBULATORY_CARE_PROVIDER_SITE_OTHER): Payer: Self-pay | Admitting: Physician Assistant

## 2024-06-19 DIAGNOSIS — G8929 Other chronic pain: Secondary | ICD-10-CM

## 2024-06-19 DIAGNOSIS — J3089 Other allergic rhinitis: Secondary | ICD-10-CM

## 2024-06-19 DIAGNOSIS — M170 Bilateral primary osteoarthritis of knee: Secondary | ICD-10-CM

## 2024-06-19 DIAGNOSIS — R053 Chronic cough: Secondary | ICD-10-CM

## 2024-06-19 DIAGNOSIS — M25562 Pain in left knee: Secondary | ICD-10-CM | POA: Insufficient documentation

## 2024-06-19 MED ORDER — DICLOFENAC 1 % TOPICAL GEL
Freq: Four times a day (QID) | CUTANEOUS | 1 refills | Status: AC
Start: 2024-06-19 — End: 2024-09-17

## 2024-06-19 MED ORDER — ACETAMINOPHEN 300 MG-CODEINE 30 MG TABLET
1.0000 | ORAL_TABLET | Freq: Three times a day (TID) | ORAL | 0 refills | Status: DC
Start: 2024-06-19 — End: 2024-07-19

## 2024-06-19 MED ORDER — PROMETHAZINE-DM 6.25 MG-15 MG/5 ML ORAL SYRUP
5.0000 mL | ORAL_SOLUTION | Freq: Four times a day (QID) | ORAL | 0 refills | Status: DC | PRN
Start: 2024-06-19 — End: 2024-07-12

## 2024-06-19 MED ORDER — PREDNISONE 20 MG TABLET
20.0000 mg | ORAL_TABLET | Freq: Two times a day (BID) | ORAL | 0 refills | Status: AC
Start: 2024-06-19 — End: 2024-06-24

## 2024-06-19 NOTE — Progress Notes (Signed)
 [facility]  [address]  [place] [address]        Telephone Visit    Name:  Christine Mcmillan MRN: [id number]   Date:  06/19/2024 Age:   78 y.o.     The patient/family initiated a request for telephone service.  Verbal consent for this service was obtained from the patient/family.    TELEMEDICINE DOCUMENTATION:    [facility]    Patient/family aware of provider location:  yes  Patient/family consent for telemedicine:  yes  Examination observed and performed by:  Greig Seats, PA-C    This note was created with assistance from [employer] via capture of conversational audio.  Consent was obtained from the patient prior to recording.      Chief Complaint   Patient presents with    Knee Pain     allergies    Medication Refill        History of Present Illness  Christine Mcmillan is a 78 year old female who has been seen today via TeleMed for complaints of bilateral knee pain and allergies.  She also needs medication refills.    She complains of significant bilateral knee pain, describing it as 'killing me.' for the past couple weeks.  She has a history of arthritis and has used in the past topical treatments such as Voltaren gel and aspirin cream for relief however states pain worse and thinks she needs some prednisone  which helps significantly better.  It has been over a year since she last used prednisone  for her symptoms. She does have a prescription for Tylenol  No. 3 p.r.n. pain and requests a refill of her medication for pain management.    In addition to knee pain, she is experiencing allergy symptoms with runny nose watery eyes cough.  She occasionally takes Zyrtec for her allergies but does not have a regular allergy medication regimen. She requests a refill of promethazine  cough medication to help manage her symptoms.        Current meds:     acetaminophen -codeine  (TYLENOL  #3) 300-30 mg Oral Tablet Take 1 Tablet by mouth Three times a day    cyclobenzaprine   (FLEXERIL ) 10 mg Oral Tablet Take 1 Tablet (10 mg total) by mouth Three times a day    ergocalciferol , vitamin D2, (DRISDOL ) 1,250 mcg (50,000 unit) Oral Capsule Take 1 Capsule (50,000 Units total) by mouth Every 7 days    furosemide  (LASIX ) 40 mg Oral Tablet Take 1 Tablet (40 mg total) by mouth Once per day as needed for Other (Edema)    LORazepam  (ATIVAN ) 2 mg Oral Tablet Take 1 Tablet (2 mg total) by mouth Every night as needed for Anxiety    NALOXONE 4 MG/ACTUATION NASAL SPRAY 1 Spray by INTRANASAL route Every 2 minutes as needed for actual or suspected opioid overdose. Call 911 if used.    nebivoloL  (BYSTOLIC ) 10 mg Oral Tablet Take 1 Tablet (10 mg total) by mouth Daily    nitrofurantoin  macrocrystaL (MACRODANTIN ) 50 mg Oral Capsule Take 1 Capsule (50 mg total) by mouth Every night    omeprazole  (PRILOSEC) 20 mg Oral Capsule, Delayed Release(E.C.) Take 1 Capsule (20 mg total) by mouth Daily    pravastatin  (PRAVACHOL ) 80 mg Oral Tablet Take 1 Tablet (80 mg total) by mouth Daily    predniSONE  (DELTASONE ) 20 mg Oral Tablet Take 1 Tablet (20 mg total) by mouth Twice daily for 5 days        Allergies  Allergen Reactions    Acetic Acid-Aluminum Acetate Swelling    Sertraline  Other Adverse Reaction (Add comment)     Headache          Past Medical History:   Diagnosis Date    Abnormal renal function     Anxiety     Arthralgia     Bell's palsy     Chronic hypokalemia     COVID-19 vaccine series completed     Depression     Esophageal reflux     History of memory loss     History of recurrent UTIs     Hypertension         Social History     Socioeconomic History    Marital status: Divorced   Tobacco Use    Smoking status: Never    Smokeless tobacco: Never   Substance and Sexual Activity    Alcohol use: Never    Drug use: Never     Social Determinants of Health     Financial Resource Strain: Low Risk  (07/22/2022)    Financial Resource Strain     SDOH Financial: No   Transportation Needs: Low Risk  (07/22/2022)     Transportation Needs     SDOH Transportation: No   Social Connections: Low Risk  (07/22/2022)    Social Connections     SDOH Social Isolation: 5 or more times a week   Intimate Partner Violence: Low Risk  (07/22/2022)    Intimate Partner Violence     SDOH Domestic Violence: No   Housing Stability: Low Risk  (07/22/2022)    Housing Stability     SDOH Housing Situation: I have housing.     SDOH Housing Worry: No        Past Surgical History:   Procedure Laterality Date    BRAIN TUMOR EXCISION      HX LIPOMA RESECTION N/A     Lipoma on back        Family Medical History:       Problem Relation (Age of Onset)    Cancer Mother             Physical Exam  Constitutional:       Comments: Patient able to relay history today without issue   Pulmonary:      Effort: Pulmonary effort is normal.   Neurological:      Mental Status: She is alert.   Psychiatric:         Mood and Affect: Mood normal.      Assessment & Plan  Chronic pain of both knees  Chronic bilateral knee pain due to primary osteoarthritis most likely worsened by weather. Previous prednisone  use without significant blood sugar impact. Uses topical treatments occasionally.  - Prescribed prednisone  20 mg twice daily for five days.  - Refilled Tylenol  #3 prescription.  - Refilled Voltaren gel prescription and will attempt insurance coverage.  - Continue follow-up with orthopedic specialist.  Osteoarthritis of both knees, unspecified osteoarthritis type    Environmental and seasonal allergies    Allergic rhinitis symptoms possibly worsened by weather. Prednisone  may aid symptom relief. Uses Zyrtec occasionally.  - Prescribed prednisone  for allergy symptoms.  - Continue Zyrtec as needed.  - Refilled promethazine  prescription.  Chronic cough  -Prometh  DM p.r.n. cough refills       Total provider time spent with the patient on the phone: 12  minutes.    Post-Discharge Follow Up Appointments  Thursday Jul 19, 2024    Return Patient Visit with Scharlene No, PA-C at  1:30 PM       Thursday Aug 16, 2024    Return Patient Visit with Scharlene No, PA-C at  1:45 PM      Thursday Sep 13, 2024    Return Patient Visit with Scharlene No, PA-C at  1:15 PM      Thursday Oct 11, 2024    Return Patient Visit with Scharlene No, PA-C at  1:15 PM      Wednesday Nov 28, 2024    Return Telephone Visit with Scharlene No, PA-C at  1:00 PM      Internal Medicine, [facility]  [facility]  [address]  [place] [address]  [phone]           This note was partially created using MModal Fluency Direct system (voice recognition software) and is inherently subject to errors including those of syntax and sound-alike substitutions which may escape proofreading.  In such instances, original meaning may be extrapolated by contextual derivation.    Baneen Wieseler, PA-C

## 2024-06-19 NOTE — Assessment & Plan Note (Signed)
-  Prometh  DM p.r.n. cough refills

## 2024-06-19 NOTE — Assessment & Plan Note (Signed)
 Chronic bilateral knee pain due to primary osteoarthritis most likely worsened by weather. Previous prednisone  use without significant blood sugar impact. Uses topical treatments occasionally.  - Prescribed prednisone  20 mg twice daily for five days.  - Refilled Tylenol  #3 prescription.  - Refilled Voltaren gel prescription and will attempt insurance coverage.  - Continue follow-up with orthopedic specialist.

## 2024-07-04 ENCOUNTER — Ambulatory Visit (INDEPENDENT_AMBULATORY_CARE_PROVIDER_SITE_OTHER): Payer: Self-pay | Admitting: Physician Assistant

## 2024-07-06 ENCOUNTER — Ambulatory Visit (INDEPENDENT_AMBULATORY_CARE_PROVIDER_SITE_OTHER): Admitting: Physician Assistant

## 2024-07-12 ENCOUNTER — Ambulatory Visit (INDEPENDENT_AMBULATORY_CARE_PROVIDER_SITE_OTHER): Payer: Self-pay | Admitting: Physician Assistant

## 2024-07-12 ENCOUNTER — Ambulatory Visit (INDEPENDENT_AMBULATORY_CARE_PROVIDER_SITE_OTHER): Admitting: Physician Assistant

## 2024-07-12 ENCOUNTER — Telehealth (INDEPENDENT_AMBULATORY_CARE_PROVIDER_SITE_OTHER): Payer: Self-pay | Admitting: Physician Assistant

## 2024-07-12 MED ORDER — CYCLOBENZAPRINE 10 MG TABLET
10.0000 mg | ORAL_TABLET | Freq: Three times a day (TID) | ORAL | 1 refills | Status: DC
Start: 2024-07-12 — End: 2024-08-09

## 2024-07-12 MED ORDER — PROMETHAZINE-DM 6.25 MG-15 MG/5 ML ORAL SYRUP
5.0000 mL | ORAL_SOLUTION | Freq: Four times a day (QID) | ORAL | 0 refills | Status: DC | PRN
Start: 2024-07-12 — End: 2024-08-16

## 2024-07-12 NOTE — Telephone Encounter (Signed)
 Patient contacted office stating that she fell and broke some ribs. She states that she has a cough and that when she coughs, it is hurting. Patient is requesting Promethazine  and Flexeril  to be called into the pharmacy. Patient uses [facility].

## 2024-07-19 ENCOUNTER — Ambulatory Visit: Payer: Self-pay | Attending: Physician Assistant | Admitting: Physician Assistant

## 2024-07-19 ENCOUNTER — Other Ambulatory Visit: Payer: Self-pay

## 2024-07-19 ENCOUNTER — Encounter (INDEPENDENT_AMBULATORY_CARE_PROVIDER_SITE_OTHER): Payer: Self-pay | Admitting: Physician Assistant

## 2024-07-19 VITALS — BP 132/88 | HR 88 | Wt 158.2 lb

## 2024-07-19 DIAGNOSIS — R7309 Other abnormal glucose: Secondary | ICD-10-CM | POA: Insufficient documentation

## 2024-07-19 DIAGNOSIS — N390 Urinary tract infection, site not specified: Secondary | ICD-10-CM | POA: Insufficient documentation

## 2024-07-19 DIAGNOSIS — R799 Abnormal finding of blood chemistry, unspecified: Secondary | ICD-10-CM | POA: Insufficient documentation

## 2024-07-19 DIAGNOSIS — E785 Hyperlipidemia, unspecified: Secondary | ICD-10-CM | POA: Insufficient documentation

## 2024-07-19 DIAGNOSIS — D582 Other hemoglobinopathies: Secondary | ICD-10-CM | POA: Insufficient documentation

## 2024-07-19 DIAGNOSIS — G8929 Other chronic pain: Secondary | ICD-10-CM | POA: Insufficient documentation

## 2024-07-19 DIAGNOSIS — G510 Bell's palsy: Secondary | ICD-10-CM | POA: Insufficient documentation

## 2024-07-19 DIAGNOSIS — F39 Unspecified mood [affective] disorder: Secondary | ICD-10-CM | POA: Insufficient documentation

## 2024-07-19 DIAGNOSIS — I10 Essential (primary) hypertension: Secondary | ICD-10-CM | POA: Insufficient documentation

## 2024-07-19 DIAGNOSIS — M25562 Pain in left knee: Secondary | ICD-10-CM | POA: Insufficient documentation

## 2024-07-19 DIAGNOSIS — Z9181 History of falling: Secondary | ICD-10-CM | POA: Insufficient documentation

## 2024-07-19 DIAGNOSIS — N289 Disorder of kidney and ureter, unspecified: Secondary | ICD-10-CM | POA: Insufficient documentation

## 2024-07-19 DIAGNOSIS — M25561 Pain in right knee: Secondary | ICD-10-CM | POA: Insufficient documentation

## 2024-07-19 MED ORDER — FUROSEMIDE 40 MG TABLET: 40.0000 mg | ORAL_TABLET | Freq: Every day | ORAL | 1 refills | Status: DC | PRN

## 2024-07-19 MED ORDER — LORAZEPAM 2 MG TABLET
2.0000 mg | ORAL_TABLET | Freq: Every evening | ORAL | 2 refills | Status: DC | PRN
Start: 2024-07-19 — End: 2024-08-16

## 2024-07-19 MED ORDER — CYANOCOBALAMIN (VIT B-12) 1,000 MCG/ML INJECTION SOLUTION
1000.0000 ug | INTRAMUSCULAR | Status: AC
Start: 2024-07-19 — End: 2024-07-19
  Administered 2024-07-19: 1000 ug via SUBCUTANEOUS

## 2024-07-19 MED ORDER — ACETAMINOPHEN 300 MG-CODEINE 30 MG TABLET
1.0000 | ORAL_TABLET | Freq: Three times a day (TID) | ORAL | 0 refills | Status: DC
Start: 2024-07-19 — End: 2024-08-16

## 2024-07-19 MED ORDER — CIPROFLOXACIN 500 MG TABLET
500.0000 mg | ORAL_TABLET | Freq: Two times a day (BID) | ORAL | 0 refills | Status: DC
Start: 2024-07-19 — End: 2024-08-16

## 2024-07-19 NOTE — Nursing Note (Signed)
 07/19/24 1600   Urine Test   Performed Status: Automated   Time collected [id number]   Manufacturer Siemens   Urine Test - Siemens   Color (Ref Range: Yellow) Yellow   Clarity (Ref Range: Clear) Clear   Glucose (Ref Range: Negative mg/dL) Negative   Bilirubin (Ref Range: Negative mg/dL) Negative   Ketones (Ref Range: Negative mg/dL) Negative   Urine Specific Gravity (Ref Range: 1.005 - 1.030) 1.020   Blood (urine) (Ref Range: Negative mg/dL) Negative   pH (Ref Range: 5.0 - 8.0) 6.0   Protein (Ref Range: Negative mg/dL) (!) 1+ (30mg /dL)   Urobilinogen (Ref Range: Normal) 0.2mg /dL (Normal)   Nitrite (Ref Range: Negative) Negative   Leukocytes (Ref Range: Negative WBC's/uL) (!) 1+   Initials KD,CMA

## 2024-07-19 NOTE — Nursing Note (Signed)
 Patient presents for 1 month follow up. The influenza vaccine was not given because the patient/caregiver declined.

## 2024-07-19 NOTE — Assessment & Plan Note (Addendum)
 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  - Check lipid panel to reassess cholesterol, triglycerides, and LDL levels.  -continue statin therapy along with diet

## 2024-07-19 NOTE — Progress Notes (Signed)
 [facility]  [address]  [place] [address]          Follow Up        Reason for Visit: Follow Up (Routine 1 month)      This note was created with assistance from [employer] via capture of conversational audio.  Consent was obtained from the patient prior to recording.      History of Present Illness  Christine Mcmillan is a 78 year old female who presents with rib pain and knee pain following a fall.    She fell on previous Tuesday and developed rib pain, seeking medical attention on the following Friday, July 06, 2024, and reports being told she had three broken ribs. The rib pain persists, especially with deep breaths, although it has improved somewhat over time.    She experiences significant knee pain, which she describes as more bothersome than the rib pain.  She has a history of chronic knee pain for which she has been seen by the orthopedic and received injections in the past.  She also has Tylenol  No. 3 use for her chronic joint as well as knee pain.    She has a history of recurrent UTIs also noted with gait issues secondary to infections noted in the past.  Patient states she does have some mild pressure but no burning with pain however would like to check urine while here.    Blood pressure is stable today at 132/88.  She continues taking Bystolic  10 mg daily.    She has a history of elevated cholesterol levels, with the last recorded levels being cholesterol at 216 mg/dL, triglycerides at 811 mg/dL, and LDL at 869 mg/dL. Her A1c was 5.8, and her hemoglobin was 15.8 g/dL, which was an increase from the previous test.    She is currently taking vitamin D  and has also been  using cyclobenzaprine  (Flexeril ) intermittently, hoping it might help with facial muscle relaxation due to Bell's palsy, which has persisted for two years.  We did discuss the potential side effects of drowsiness and the risk of falling with the use of Flexeril  however patient states she has taking it  for some time and never had an issue.    She requires refills for Tylenol  3 and lorazepam , which she does not take regularly but has on hand for anxiety.  Patient once again aware of increased risk using opioids along with benzos at age greater than 2.       PHQ Questionnaire         Past Medical History:   Diagnosis Date    Abnormal renal function     Anxiety     Arthralgia     Bell's palsy     Chronic hypokalemia     COVID-19 vaccine series completed     Depression     Esophageal reflux     History of memory loss     History of recurrent UTIs     Hypertension          Past Surgical History:   Procedure Laterality Date    BRAIN TUMOR EXCISION      HX LIPOMA RESECTION N/A     Lipoma on back      Family Medical History:       Problem Relation (Age of Onset)    Cancer Mother            Social History[1]  Medication:  diclofenac  sodium (VOLTAREN) 1 % Gel,  Apply topically Four times a day for 90 days  NALOXONE 4 MG/ACTUATION NASAL SPRAY, 1 Spray by INTRANASAL route Every 2 minutes as needed for actual or suspected opioid overdose. Call 911 if used.  acetaminophen -codeine  (TYLENOL  #3) 300-30 mg Oral Tablet, Take 1 Tablet by mouth Three times a day  cyclobenzaprine  (FLEXERIL ) 10 mg Oral Tablet, Take 1 Tablet (10 mg total) by mouth Three times a day  ergocalciferol , vitamin D2, (DRISDOL ) 1,250 mcg (50,000 unit) Oral Capsule, Take 1 Capsule (50,000 Units total) by mouth Every 7 days  furosemide  (LASIX ) 40 mg Oral Tablet, Take 1 Tablet (40 mg total) by mouth Once per day as needed for Other (Edema)  LORazepam  (ATIVAN ) 2 mg Oral Tablet, Take 1 Tablet (2 mg total) by mouth Every night as needed for Anxiety  nebivoloL  (BYSTOLIC ) 10 mg Oral Tablet, Take 1 Tablet (10 mg total) by mouth Daily  nitrofurantoin  macrocrystaL (MACRODANTIN ) 50 mg Oral Capsule, Take 1 Capsule (50 mg total) by mouth Every night  omeprazole  (PRILOSEC) 20 mg Oral Capsule, Delayed Release(E.C.), Take 1 Capsule (20 mg total) by mouth Daily  pravastatin   (PRAVACHOL ) 80 mg Oral Tablet, Take 1 Tablet (80 mg total) by mouth Daily  promethazine -dextromethorphan (PHENERGAN -DM) 6.25-15 mg/5 mL Oral Syrup, Take 5 mL by mouth Four times a day as needed for Cough for up to 7 days    No facility-administered medications prior to visit.    Allergies:Allergies[2]       Vitals:    07/19/24 1336   BP: 132/88   Pulse: 88   SpO2: 97%   Weight: 71.8 kg (158 lb 3.2 oz)     Physical Exam  CONSTITUTIONAL: She is not in acute distress, Normal appearance.  HENT: Right Ear Tympanic membrane normal, Left Ear Tympanic membrane normal, Mouth Mucous membranes are moist, Pharynx Oropharynx is clear.  EYES: Pupils are equal, round, and reactive to light.  CARDIOVASCULAR: Normal rate and regular rhythm, Normal pulses, Normal heart sounds.  PULMONARY: Pulmonary effort is normal, Normal breath sounds, Lungs clear to auscultation, no wheezing.  Chest nontender  ABDOMINAL: Bowel sounds are normal, Abdomen is soft, There is no abdominal tenderness.  MUSCULOSKELETAL:   + significant arthritic changes bilateral knees which are painful on range of motion; + crepitus bilateral  No redness warmth noted  Gait overall stable   SKIN: Skin is warm, No lesion or rash.  NEUROLOGICAL: No focal deficit present, She is alert and oriented to person, place, and time.  PSYCHIATRIC: Mood normal, Behavior normal, Thought content normal, Judgment normal.         Urine Dip Results:   Time collected: 0229  Glucose (Ref Range: Negative mg/dL): Negative  Bilirubin (Ref Range: Negative mg/dL): Negative  Ketones (Ref Range: Negative mg/dL): Negative  Urine Specific Gravity (Ref Range: 1.005 - 1.030): 1.020  Blood (urine) (Ref Range: Negative mg/dL): Negative  pH (Ref Range: 5.0 - 8.0): 6.0  Protein (Ref Range: Negative mg/dL): (!) 1+ (30mg /dL)  Urobilinogen (Ref Range: Normal): 0.2mg /dL (Normal)  Nitrite (Ref Range: Negative): Negative  Leukocytes (Ref Range: Negative WBC's/uL): (!) Small      Results  Previous LABS  - Total  Cholesterol: 216 mg/dL  - Triglycerides: 811 mg/dL  - LDL Cholesterol: 869 mg/dL  - Hemoglobin J8r (YaJ8r): 5.8%  - Hemoglobin (Hb): 15.8 g/dL    Assessment & Plan  Recurrent UTI  Recurrent urinary tract infection with recent fall possibly related.  -urine sent for culture  -prescribe Cipro  500 b.i.d. times 10 days  -  encouraged use of Dmannose 500 mg b.i.d.  Chronic pain of both knees  Chronic bilateral knee pain due to primary osteoarthritis  However increased pain currently noted due to recent fall.  -encouraged to use topical anti-inflammatory b.i.d.-q.i.d. PRN  -continue Tylenol  No. 3 PRN pain  -make follow-up with orthopedic as soon as possible  -encouraged use of cane for gait stability  -physical therapy offer however refused at this time  Hx of fall  History of fall resulting in knee pain along with reported multiple rib fractures-healing.  - Continue current pain management regimen with acetaminophen -codeine  (Tylenol  #3) 300-30 mg oral three times a day.  Essential hypertension  Hypertension stable with use of Bystolic  10 mg daily.  Blood pressure today 132/88.  -continue Bystolic  along with blood pressure monitoring  Abnormal renal function  Chronic kidney disease, unspecified stage with history of recurrent UTIs possibly related.  - obtain lab to assess kidney function.  Hyperlipidemia, unspecified hyperlipidemia type  Mixed hyperlipidemia with current use of Pravachol  80 mg nightly.  - Check lipid panel to reassess cholesterol, triglycerides, and LDL levels.  -continue statin therapy along with diet  Abnormal glucose  Abnormal glucose with previous A1c level stable.  -obtain glucose A1c and blood work  Mood disorder (CMS HCC)  Chronic anxiety with history of depression also noted.  Patient has Ativan  2 mg to be used at night PRN.  Patient says Ativan  also helps her rest noting the insomnia in the past has increased her anxiety levels.  She is aware of the increased risk taking benzos along side of  opioids greater than age 51.  Patient reports no previous side effects or issues with medication use however.  Discussed weaning trial however Ativan  which she states she will attempt to cut the Ativan  in half in use 1 mg nightly to see if it is as effective.  Elevated hemoglobin (CMS HCC)  Elevated hemoglobin, under evaluation  - Order blood test for hemochromatosis.  - Check hemoglobin levels again.  Bell's palsy  Chronic sequelae of Bell's palsy  Persistent facial muscle issues. Advised against regular cyclobenzaprine  use due to kidney impact and drowsiness risk. Age-related slowing of recovery noted.  - Avoid regular use of cyclobenzaprine  for Bell's palsy due to potential kidney impact and risk of drowsiness leading to falls.  Abnormal finding of blood chemistry, unspecified         Orders Placed This Encounter    URINE CULTURE,ROUTINE    CBC/DIFF    COMPREHENSIVE METABOLIC PNL, FASTING    HGA1C (HEMOGLOBIN A1C WITH EST AVG GLUCOSE)    URINALYSIS, MACROSCOPIC AND MICROSCOPIC W/CULTURE REFLEX    LIPID PANEL    MICROALBUMIN/CREATININE RATIO, URINE, RANDOM    HEMOCHROMATOSIS HFE GENE ANALYSIS, BLOOD    MAGNESIUM    THYROID  STIMULATING HORMONE WITH FREE T4 REFLEX    DRUG SCREEN, NO CONFIRMATION, URINE    IRON TRANSFERRIN AND TIBC    CBC WITH DIFF    URINALYSIS, MACROSCOPIC    URINALYSIS, MICROSCOPIC    cyanocobalamin  (VITAMIN B12) 1000 mcg/mL injection    furosemide  (LASIX ) 40 mg Oral Tablet         Post-Discharge Follow Up Appointments       Thursday Nov 08, 2024    Return Patient Visit with Scharlene No, PA-C at  2:00 PM      Wednesday Nov 28, 2024    Return Telephone Visit with Scharlene No, PA-C at  1:00 PM      Monday Dec 10, 2024    Return Patient Visit with Scharlene No, PA-C at  2:30 PM      Monday Jan 07, 2025    Return Patient Visit with Scharlene No, PA-C at  1:30 PM      Monday Feb 04, 2025    Return Patient Visit with Scharlene No, PA-C at  1:30 PM      [facility]  [facility]  [address]  [place] [address]  [phone]           Seek medical attention for new or worsening symptoms.  Patient has been seen in this clinic within the last 3 years.     Alize Acy, PA-C          This note was partially created using MModal Fluency Direct system (voice recognition software) and is inherently subject to errors including those of syntax and sound-alike substitutions which may escape proofreading.  In such instances, original meaning may be extrapolated by contextual derivation.         [1]   Social History  Tobacco Use    Smoking status: Never    Smokeless tobacco: Never   Substance Use Topics    Alcohol use: Never    Drug use: Never   [2]   Allergies  Allergen Reactions    Acetic Acid-Aluminum Acetate Swelling    Sertraline  Other Adverse Reaction (Add comment)     Headache

## 2024-07-19 NOTE — Assessment & Plan Note (Addendum)
 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  -encouraged to use topical anti-inflammatory b.i.d.-q.i.d. PRN  -continue Tylenol  No. 3 PRN pain  -make follow-up with orthopedic as soon as possible  -encouraged use of cane for gait stability  -physical therapy offer however refused at this time

## 2024-07-19 NOTE — Assessment & Plan Note (Addendum)
 Chronic kidney disease, unspecified stage with history of recurrent UTIs possibly related.  - obtain lab to assess kidney function.

## 2024-07-19 NOTE — Assessment & Plan Note (Signed)
 Chronic sequelae of Bell's palsy  Persistent facial muscle issues. Advised against regular cyclobenzaprine  use due to kidney impact and drowsiness risk. Age-related slowing of recovery noted.  - Avoid regular use of cyclobenzaprine  for Bell's palsy due to potential kidney impact and risk of drowsiness leading to falls.

## 2024-07-20 ENCOUNTER — Ambulatory Visit (INDEPENDENT_AMBULATORY_CARE_PROVIDER_SITE_OTHER): Payer: Self-pay

## 2024-07-20 LAB — COMPREHENSIVE METABOLIC PNL, FASTING
ALBUMIN/GLOBULIN RATIO: 1.5 — ABNORMAL HIGH (ref 0.8–1.4)
ALBUMIN: 4.3 g/dL (ref 3.5–5.7)
ALKALINE PHOSPHATASE: 84 U/L (ref 34–104)
ALT (SGPT): 22 U/L (ref 7–52)
ANION GAP: 10 mmol/L (ref 4–13)
AST (SGOT): 29 U/L (ref 13–39)
BILIRUBIN TOTAL: 0.9 mg/dL (ref 0.3–1.0)
BUN/CREA RATIO: 16 (ref 6–22)
BUN: 15 mg/dL (ref 7–25)
CALCIUM, CORRECTED: 9.3 mg/dL (ref 8.9–10.8)
CALCIUM: 9.5 mg/dL (ref 8.6–10.3)
CHLORIDE: 103 mmol/L (ref 98–107)
CO2 TOTAL: 23 mmol/L (ref 21–31)
CREATININE: 0.91 mg/dL (ref 0.60–1.30)
ESTIMATED GFR: 64 mL/min/1.73mˆ2 (ref >59)
GLOBULIN: 2.8 (ref 2.0–3.5)
GLUCOSE: 93 mg/dL (ref 74–109)
OSMOLALITY, CALCULATED: 273 mosm/kg (ref 270–290)
POTASSIUM: 4.3 mmol/L (ref 3.5–5.1)
PROTEIN TOTAL: 7.1 g/dL (ref 6.4–8.9)
SODIUM: 136 mmol/L (ref 136–145)

## 2024-07-20 LAB — CBC WITH DIFF
BASOPHIL #: 0.1 x10ˆ3/uL (ref 0.00–0.10)
BASOPHIL %: 1 % (ref 0–1)
EOSINOPHIL #: 0.3 x10ˆ3/uL (ref 0.00–0.50)
EOSINOPHIL %: 3 % (ref 1–7)
HCT: 45.2 % — ABNORMAL HIGH (ref 31.2–41.9)
HGB: 15.7 g/dL — ABNORMAL HIGH (ref 10.9–14.3)
LYMPHOCYTE #: 2.6 x10ˆ3/uL (ref 1.10–3.10)
LYMPHOCYTE %: 30 % (ref 16–46)
MCH: 29.8 pg (ref 24.7–32.8)
MCHC: 34.8 g/dL (ref 32.3–35.6)
MCV: 85.7 fL (ref 75.5–95.3)
MONOCYTE #: 0.6 x10ˆ3/uL (ref 0.20–0.90)
MONOCYTE %: 6 % (ref 4–11)
MPV: 8.2 fL (ref 7.9–10.8)
NEUTROPHIL #: 5.3 x10ˆ3/uL (ref 1.90–8.20)
NEUTROPHIL %: 60 % (ref 43–77)
PLATELETS: 343 x10ˆ3/uL (ref 140–440)
RBC: 5.28 x10ˆ6/uL — ABNORMAL HIGH (ref 3.63–4.92)
RDW: 14.2 % (ref 12.3–17.7)
WBC: 8.9 x10ˆ3/uL (ref 3.8–11.8)

## 2024-07-20 LAB — URINALYSIS, MICROSCOPIC
BACTERIA: NEGATIVE /HPF
HYALINE CASTS: 1 /LPF — ABNORMAL HIGH (ref <0)
RBCS: 1 /HPF (ref <4)
SQUAMOUS EPITHELIAL: 2 /HPF (ref <28)
TRANSITIONAL EPITHELIAL CELLS URINE: <1 /HPF (ref <6)
WBCS: 11 /HPF — ABNORMAL HIGH (ref <6)

## 2024-07-20 LAB — LIPID PANEL
CHOL/HDL RATIO: 4.2
CHOLESTEROL: 230 mg/dL — ABNORMAL HIGH (ref <200)
HDL CHOL: 55 mg/dL (ref >=40)
LDL CALC: 147 mg/dL — ABNORMAL HIGH (ref 0–100)
TRIGLYCERIDES: 140 mg/dL (ref <=150)
VLDL CALC: 28 mg/dL (ref 0–50)

## 2024-07-20 LAB — MICROALBUMIN/CREATININE RATIO, URINE, RANDOM
CREATININE RANDOM URINE: 223 mg/dL — ABNORMAL HIGH (ref 30–125)
MICROALBUMIN RANDOM URINE: 3.9 mg/dL
MICROALBUMIN/CREATININE RATIO RANDOM URINE: 17.5 mg/g

## 2024-07-20 LAB — URINALYSIS, MACROSCOPIC
BILIRUBIN: NEGATIVE mg/dL
BLOOD: NEGATIVE mg/dL
GLUCOSE: NEGATIVE mg/dL
LEUKOCYTES: 250 WBCs/uL — AB
NITRITE: NEGATIVE
PH: 6 (ref 5.0–9.0)
PROTEIN: 30 mg/dL — AB
SPECIFIC GRAVITY: >1.03 — ABNORMAL HIGH (ref 1.002–1.030)
UROBILINOGEN: NORMAL mg/dL

## 2024-07-20 LAB — IRON TRANSFERRIN AND TIBC
IRON (TRANSFERRIN) SATURATION: 40 % (ref 15–50)
IRON: 120 ug/dL (ref 50–212)
TOTAL IRON BINDING CAPACITY: 301 ug/dL (ref 250–450)
TRANSFERRIN: 215 mg/dL (ref 203–362)
UIBC: 181 ug/dL (ref 130–375)

## 2024-07-20 LAB — DRUG SCREEN, NO CONFIRMATION, URINE
AMPHETAMINES URINE: NEGATIVE
BARBITURATES URINE: NEGATIVE
BENZODIAZEPINES URINE: NEGATIVE
BUPRENORPHINE URINE: NEGATIVE
CANNABINOIDS URINE: NEGATIVE
COCAINE METABOLITES URINE: NEGATIVE
FENTANYL, URINE: NEGATIVE
METHADONE URINE: NEGATIVE
OPIATES URINE: NEGATIVE
OXYCODONE URINE: NEGATIVE
PCP URINE: NEGATIVE

## 2024-07-20 LAB — THYROID STIMULATING HORMONE WITH FREE T4 REFLEX: TSH: 1.65 u[IU]/mL (ref 0.450–5.330)

## 2024-07-20 LAB — MAGNESIUM: MAGNESIUM: 2 mg/dL (ref 1.9–2.7)

## 2024-07-20 LAB — HGA1C (HEMOGLOBIN A1C WITH EST AVG GLUCOSE): HEMOGLOBIN A1C: 5.8 % (ref 4.0–6.0)

## 2024-07-22 LAB — URINE CULTURE,ROUTINE: URINE CULTURE: NO GROWTH

## 2024-07-23 MED ORDER — SULFAMETHOXAZOLE 800 MG-TRIMETHOPRIM 160 MG TABLET
1.0000 | ORAL_TABLET | Freq: Two times a day (BID) | ORAL | 0 refills | Status: DC
Start: 2024-07-23 — End: 2024-08-02

## 2024-07-23 NOTE — Telephone Encounter (Signed)
 Attempted to notify patient of lab results, no answer. Patient was prescribed an antibiotic on 9/25. Left message for patient to call the office.

## 2024-07-25 ENCOUNTER — Other Ambulatory Visit (INDEPENDENT_AMBULATORY_CARE_PROVIDER_SITE_OTHER): Payer: Self-pay | Admitting: Physician Assistant

## 2024-07-25 MED ORDER — SULFAMETHOXAZOLE 800 MG-TRIMETHOPRIM 160 MG TABLET
1.0000 | ORAL_TABLET | Freq: Two times a day (BID) | ORAL | 0 refills | Status: AC
Start: 2024-07-25 — End: 2024-08-04

## 2024-07-25 NOTE — Telephone Encounter (Signed)
 Patient contacted office stating that when she went to pick up the Bactrim  from the pharmacy they told her that she had already picked it up.

## 2024-07-30 ENCOUNTER — Ambulatory Visit: Payer: Self-pay | Admitting: Physician Assistant

## 2024-07-31 LAB — HEMOCHROMATOSIS HFE GENE ANALYSIS, BLOOD

## 2024-08-09 ENCOUNTER — Other Ambulatory Visit (INDEPENDENT_AMBULATORY_CARE_PROVIDER_SITE_OTHER): Payer: Self-pay | Admitting: Physician Assistant

## 2024-08-09 MED ORDER — CYCLOBENZAPRINE 10 MG TABLET: 10.0000 mg | ORAL_TABLET | Freq: Three times a day (TID) | ORAL | 1 refills | Status: DC

## 2024-08-16 ENCOUNTER — Ambulatory Visit: Payer: Self-pay | Attending: Physician Assistant | Admitting: Physician Assistant

## 2024-08-16 ENCOUNTER — Other Ambulatory Visit: Payer: Self-pay

## 2024-08-16 ENCOUNTER — Encounter (INDEPENDENT_AMBULATORY_CARE_PROVIDER_SITE_OTHER): Payer: Self-pay | Admitting: Physician Assistant

## 2024-08-16 VITALS — BP 124/80 | HR 91 | Ht 60.0 in | Wt 162.0 lb

## 2024-08-16 DIAGNOSIS — R5382 Chronic fatigue, unspecified: Secondary | ICD-10-CM | POA: Insufficient documentation

## 2024-08-16 DIAGNOSIS — M25561 Pain in right knee: Secondary | ICD-10-CM | POA: Insufficient documentation

## 2024-08-16 DIAGNOSIS — F32A Depression, unspecified: Secondary | ICD-10-CM | POA: Insufficient documentation

## 2024-08-16 DIAGNOSIS — G8929 Other chronic pain: Secondary | ICD-10-CM | POA: Insufficient documentation

## 2024-08-16 DIAGNOSIS — M1990 Unspecified osteoarthritis, unspecified site: Secondary | ICD-10-CM | POA: Insufficient documentation

## 2024-08-16 DIAGNOSIS — I10 Essential (primary) hypertension: Secondary | ICD-10-CM | POA: Insufficient documentation

## 2024-08-16 DIAGNOSIS — F419 Anxiety disorder, unspecified: Secondary | ICD-10-CM | POA: Insufficient documentation

## 2024-08-16 DIAGNOSIS — N390 Urinary tract infection, site not specified: Secondary | ICD-10-CM | POA: Insufficient documentation

## 2024-08-16 DIAGNOSIS — M25562 Pain in left knee: Secondary | ICD-10-CM | POA: Insufficient documentation

## 2024-08-16 MED ORDER — LORAZEPAM 2 MG TABLET
2.0000 mg | ORAL_TABLET | Freq: Every evening | ORAL | 2 refills | Status: DC | PRN
Start: 2024-08-16 — End: 2024-09-13

## 2024-08-16 MED ORDER — PROMETHAZINE-DM 6.25 MG-15 MG/5 ML ORAL SYRUP
5.0000 mL | ORAL_SOLUTION | Freq: Four times a day (QID) | ORAL | 0 refills | Status: DC | PRN
Start: 2024-08-16 — End: 2024-09-13

## 2024-08-16 MED ORDER — PRAVASTATIN 80 MG TABLET
80.0000 mg | ORAL_TABLET | Freq: Every day | ORAL | 1 refills | Status: AC
Start: 2024-08-16 — End: ?

## 2024-08-16 MED ORDER — CYANOCOBALAMIN (VIT B-12) 1,000 MCG/ML INJECTION SOLUTION
1000.0000 ug | Freq: Once | INTRAMUSCULAR | Status: AC
Start: 2024-08-16 — End: 2024-08-16
  Administered 2024-08-16: 1000 ug via INTRAMUSCULAR

## 2024-08-16 MED ORDER — ACETAMINOPHEN 300 MG-CODEINE 30 MG TABLET
1.0000 | ORAL_TABLET | Freq: Three times a day (TID) | ORAL | 0 refills | Status: DC
Start: 2024-08-16 — End: 2024-09-13

## 2024-08-16 NOTE — Progress Notes (Signed)
 [facility]  [address]  [place] [address]          Follow Up        Reason for Visit: Follow Up (Routine 1 month )  B12 shot    This note was created with assistance from [employer] via capture of conversational audio.  Consent was obtained from the patient prior to recording.      History of Present Illness  Christine Mcmillan is a 78 year old female who presents with chronic knee pain and urinary symptoms.    She experiences ongoing knee pain for which he is seeing orthopedic. She is considering treatment options, including knee replacement surgery, as Medicare has not approved the Roostercomb injection. She is debating whether to pay out of pocket for the injection or proceed with surgery.    She has a history of recurrent urinary tract infections. Her last urine culture showed no growth. She previously took Cipro, which was effective, after Bactrim  did not work well. No burning sensation during urination is reported, but she feels slightly dehydrated today.    Her cholesterol levels were elevated at her last check-up, with a total cholesterol of 230 mg/dL and LDL of 852 mg/dL. She admits to possibly skipping her cholesterol medication for a few days, especially after breaking her ribs, which limited her activities.    She continues to have a cough which is chronic but denies feeling sick or having congestion.  She requests refills of her cough medication.    She mentions needing dental work as her bridge fell out. She attributes these issues to a previous dentist who has since retired.    Blood pressure is stable currently 124/80.  Continues to take her Bystolic  10 mg daily.    Requesting B12 injection for chronic fatigue issues.  Notes that the B12 does help her energy.    She also has a history of chronic anxiety  for which she uses Ativan  2 mg nightly PRN.  Requesting medication refill.       PHQ Questionnaire         Past Medical History:   Diagnosis Date    Abnormal renal  function     Anxiety     Arthralgia     Bell's palsy     Chronic hypokalemia     COVID-19 vaccine series completed     Depression     Esophageal reflux     History of memory loss     History of recurrent UTIs     Hypertension          Past Surgical History:   Procedure Laterality Date    BRAIN TUMOR EXCISION      HX LIPOMA RESECTION N/A     Lipoma on back      Family Medical History:       Problem Relation (Age of Onset)    Cancer Mother            Social History[1]  Medication:  cyclobenzaprine  (FLEXERIL ) 10 mg Oral Tablet, Take 1 Tablet (10 mg total) by mouth Three times a day  diclofenac  sodium (VOLTAREN) 1 % Gel, Apply topically Four times a day for 90 days  ergocalciferol , vitamin D2, (DRISDOL ) 1,250 mcg (50,000 unit) Oral Capsule, Take 1 Capsule (50,000 Units total) by mouth Every 7 days  furosemide  (LASIX ) 40 mg Oral Tablet, Take 1 Tablet (40 mg total) by mouth Once per day as needed for Other (Edema)  NALOXONE 4  MG/ACTUATION NASAL SPRAY, 1 Spray by INTRANASAL route Every 2 minutes as needed for actual or suspected opioid overdose. Call 911 if used.  nebivoloL  (BYSTOLIC ) 10 mg Oral Tablet, Take 1 Tablet (10 mg total) by mouth Daily  nitrofurantoin  macrocrystaL (MACRODANTIN ) 50 mg Oral Capsule, Take 1 Capsule (50 mg total) by mouth Every night  omeprazole  (PRILOSEC) 20 mg Oral Capsule, Delayed Release(E.C.), Take 1 Capsule (20 mg total) by mouth Daily  acetaminophen -codeine  (TYLENOL  #3) 300-30 mg Oral Tablet, Take 1 Tablet by mouth Three times a day  ciprofloxacin HCl (CIPRO) 500 mg Oral Tablet, Take 1 Tablet (500 mg total) by mouth Twice daily  LORazepam  (ATIVAN ) 2 mg Oral Tablet, Take 1 Tablet (2 mg total) by mouth Every night as needed for Anxiety  pravastatin  (PRAVACHOL ) 80 mg Oral Tablet, Take 1 Tablet (80 mg total) by mouth Daily  promethazine -dextromethorphan (PHENERGAN -DM) 6.25-15 mg/5 mL Oral Syrup, Take 5 mL by mouth Four times a day as needed for Cough for up to 7 days    No facility-administered  medications prior to visit.    Allergies:Allergies[2]       Physical Exam    Vitals:    08/16/24 1408   BP: 124/80   Pulse: 91   SpO2: 99%   Weight: 73.5 kg (162 lb)   Height: 1.524 m (5')   BMI: 31.64       CONSTITUTIONAL: She is not in acute distress, Normal appearance.  HENT: Right Ear Tympanic membrane normal, Left Ear Tympanic membrane normal, Mouth Mucous membranes are moist, Pharynx Oropharynx is clear.  EYES: Pupils are equal, round, and reactive to light.  CARDIOVASCULAR: Normal rate and regular rhythm, Normal pulses, Normal heart sounds.  PULMONARY: Pulmonary effort is normal, Normal breath sounds.  ABDOMINAL: Bowel sounds are normal, Abdomen is soft, There is no abdominal tenderness.  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  NEUROLOGICAL: No focal deficit present, She is alert and oriented to person, place, and time.  PSYCHIATRIC: Mood normal, Behavior normal, Thought content normal, Judgment normal.         Urine Dip Results:   Performed Status:: Automated  Time collected: 1506  Manufacturer: Siemens  Color (Ref Range: Yellow): Yellow  Clarity (Ref Range: Clear): Clear  Glucose (Ref Range: Negative mg/dL): Negative  Bilirubin (Ref Range: Negative mg/dL): Negative  Ketones (Ref Range: Negative mg/dL): Negative  Urine Specific Gravity (Ref Range: 1.005 - 1.030): 1.025  Blood (urine) (Ref Range: Negative mg/dL): (!) Trace Intact  pH (Ref Range: 5.0 - 8.0): 5.5  Protein (Ref Range: Negative mg/dL): (!) Trace  Urobilinogen (Ref Range: Normal): 0.2mg /dL (Normal)  Nitrite (Ref Range: Negative): Negative  Leukocytes (Ref Range: Negative WBC's/uL): (!) Small     Results  LABS  - Total Cholesterol: 216 mg/dL  - Triglycerides: 811 mg/dL  - LDL Cholesterol: 869 mg/dL  - Hemoglobin J8r  (YaJ8r): 5.8%  - Hemoglobin (Hb): 15.8 g/dL      Assessment & Plan  Recurrent UTI  Recurrent UTIs with urinalysis showing small leukocytes.  -urinalysis sent for culture  -to increase fluids/use probiotic cranberry juice   -will prescribe antibiotic pending culture results  Essential hypertension  Blood pressure stable today at 124/80.  -  Continue Bystolic  10 mg daily along with blood pressure monitoring  Chronic pain of both knees  Chronic bilateral knee pain due to primary osteoarthritis  Following with orthopedic.  - continue symptomatic treatment for knee pain including use of Tylenol  No. 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  - continue symptomatic treatment for knee pain including use of Tylenol  No. 3 PRN pain along with topical NSAIDs  -follow-up with Orthopedic as scheduled   Chronic fatigue  Chronic fatigue issues improved with B12 supplement.  -administer B12 IM  Anxiety and depression  Mood disorder stable with use of Ativan  nightly.  -  Continue Ativan  2 mg q.h.s. PRN-refills given     Orders Placed This Encounter    URINALYSIS, MACROSCOPIC AND MICROSCOPIC W/CULTURE REFLEX    URINALYSIS, MACROSCOPIC    URINALYSIS, MICROSCOPIC    acetaminophen -codeine  (TYLENOL  #3) 300-30 mg Oral Tablet    LORazepam  (ATIVAN ) 2 mg Oral Tablet    pravastatin  (PRAVACHOL ) 80 mg Oral Tablet    promethazine -dextromethorphan (PHENERGAN -DM) 6.25-15 mg/5 mL Oral Syrup    cyanocobalamin  (VITAMIN B12) 1000 mcg/mL injection           Follow up:   Post-Discharge Follow Up Appointments       Thursday Sep 13, 2024    Return Patient Visit with Scharlene No, PA-C at  1:15 PM      Thursday Oct 11, 2024    Return Patient Visit with Scharlene No, PA-C at  1:15 PM      Wednesday Nov 28, 2024    Return Telephone Visit with Scharlene No, PA-C at  1:00 PM       [facility]  [facility]  [address]  [place] [address]  [phone]           Seek medical attention for new or worsening symptoms.  Patient has been seen in this clinic within the last 3 years.     Gerrianne Aydelott, PA-C          This note was partially created using MModal Fluency Direct system (voice recognition software) and is inherently subject to errors including those of syntax and sound-alike substitutions which may escape proofreading.  In such instances, original meaning may be extrapolated by contextual derivation.         [1]   Social History  Tobacco Use    Smoking status: Never    Smokeless tobacco: Never   Substance Use Topics    Alcohol use: Never    Drug use: Never   [2]   Allergies  Allergen Reactions    Acetic Acid-Aluminum Acetate Swelling    Sertraline  Other Adverse Reaction (Add comment)     Headache

## 2024-08-16 NOTE — Nursing Note (Signed)
 08/16/24 1500   Urine Test   Performed Status: Automated   Time collected 1506   Manufacturer Siemens   Urine Test - Siemens   Color (Ref Range: Yellow) Yellow   Clarity (Ref Range: Clear) Clear   Glucose (Ref Range: Negative mg/dL) Negative   Bilirubin (Ref Range: Negative mg/dL) Negative   Ketones (Ref Range: Negative mg/dL) Negative   Urine Specific Gravity (Ref Range: 1.005 - 1.030) 1.025   Blood (urine) (Ref Range: Negative mg/dL) (!) Trace Intact   pH (Ref Range: 5.0 - 8.0) 5.5   Protein (Ref Range: Negative mg/dL) (!) Trace   Urobilinogen (Ref Range: Normal) 0.2mg /dL (Normal)   Nitrite (Ref Range: Negative) Negative   Leukocytes (Ref Range: Negative WBC's/uL) (!) 1+   Initials ap

## 2024-08-16 NOTE — Assessment & Plan Note (Signed)
 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  - continue symptomatic treatment for knee pain including use of Tylenol  No. 3 PRN pain along with topical NSAIDs  -follow-up with Orthopedic as scheduled

## 2024-08-16 NOTE — Assessment & Plan Note (Signed)
 Blood pressure stable today at 124/80.  -  Continue Bystolic  10 mg daily along with blood pressure monitoring

## 2024-08-16 NOTE — Assessment & Plan Note (Signed)
 Mood disorder stable with use of Ativan  nightly.  -  Continue Ativan  2 mg q.h.s. PRN-refills given

## 2024-08-16 NOTE — Nursing Note (Signed)
 Patient presents today for routine 1 month follow up for medication refills. Patient requests B12 injection today also.

## 2024-08-16 NOTE — Assessment & Plan Note (Signed)
 Chronic fatigue issues improved with B12 supplement.  -administer B12 IM

## 2024-08-17 ENCOUNTER — Ambulatory Visit (INDEPENDENT_AMBULATORY_CARE_PROVIDER_SITE_OTHER): Payer: Self-pay

## 2024-08-17 LAB — URINALYSIS, MACROSCOPIC
BILIRUBIN: NEGATIVE mg/dL
BLOOD: NEGATIVE mg/dL
GLUCOSE: NEGATIVE mg/dL
KETONES: NEGATIVE mg/dL
LEUKOCYTES: 500 WBCs/uL — AB
NITRITE: NEGATIVE
PH: 5.5 (ref 5.0–9.0)
PROTEIN: NEGATIVE mg/dL
SPECIFIC GRAVITY: 1.023 (ref 1.002–1.030)
UROBILINOGEN: NORMAL mg/dL

## 2024-08-17 LAB — URINALYSIS, MICROSCOPIC: WBCS: 21 /HPF — ABNORMAL HIGH (ref <6)

## 2024-08-17 NOTE — Telephone Encounter (Signed)
 Attempted to notify patient of lab results. No answer, left message for patient to call the office.

## 2024-08-19 LAB — URINE CULTURE,ROUTINE: URINE CULTURE: NO GROWTH

## 2024-08-20 MED ORDER — CIPROFLOXACIN 500 MG TABLET
500.0000 mg | ORAL_TABLET | Freq: Two times a day (BID) | ORAL | 0 refills | Status: DC
Start: 2024-08-20 — End: 2024-09-13

## 2024-09-13 ENCOUNTER — Encounter (INDEPENDENT_AMBULATORY_CARE_PROVIDER_SITE_OTHER): Payer: Self-pay | Admitting: Physician Assistant

## 2024-09-13 ENCOUNTER — Ambulatory Visit: Payer: Self-pay | Attending: Physician Assistant | Admitting: Physician Assistant

## 2024-09-13 ENCOUNTER — Other Ambulatory Visit: Payer: Self-pay

## 2024-09-13 DIAGNOSIS — M170 Bilateral primary osteoarthritis of knee: Secondary | ICD-10-CM

## 2024-09-13 DIAGNOSIS — G8929 Other chronic pain: Secondary | ICD-10-CM

## 2024-09-13 DIAGNOSIS — M1990 Unspecified osteoarthritis, unspecified site: Secondary | ICD-10-CM

## 2024-09-13 DIAGNOSIS — J3089 Other allergic rhinitis: Secondary | ICD-10-CM

## 2024-09-13 DIAGNOSIS — R053 Chronic cough: Secondary | ICD-10-CM

## 2024-09-13 MED ORDER — NITROFURANTOIN MACROCRYSTAL 50 MG CAPSULE: 50.0000 mg | ORAL_CAPSULE | Freq: Every evening | ORAL | 1 refills | Status: DC

## 2024-09-13 MED ORDER — NEBIVOLOL 10 MG TABLET
10.0000 mg | ORAL_TABLET | Freq: Every day | ORAL | 1 refills | Status: AC
Start: 2024-09-13 — End: ?

## 2024-09-13 MED ORDER — ERGOCALCIFEROL (VITAMIN D2) 1,250 MCG (50,000 UNIT) CAPSULE: 50000.0000 [IU] | ORAL_CAPSULE | ORAL | 3 refills | Status: DC

## 2024-09-13 MED ORDER — LORAZEPAM 2 MG TABLET: 2.0000 mg | ORAL_TABLET | Freq: Every evening | ORAL | 2 refills | Status: DC | PRN

## 2024-09-13 MED ORDER — ACETAMINOPHEN 300 MG-CODEINE 30 MG TABLET: 1.0000 | ORAL_TABLET | Freq: Four times a day (QID) | ORAL | 0 refills | Status: DC | PRN

## 2024-09-13 MED ORDER — PROMETHAZINE-DM 6.25 MG-15 MG/5 ML ORAL SYRUP
5.0000 mL | ORAL_SOLUTION | Freq: Four times a day (QID) | ORAL | 0 refills | Status: AC | PRN
Start: 2024-09-13 — End: 2024-09-20

## 2024-09-13 MED ORDER — OMEPRAZOLE 20 MG CAPSULE,DELAYED RELEASE
20.0000 mg | DELAYED_RELEASE_CAPSULE | Freq: Every day | ORAL | 1 refills | Status: AC
Start: 2024-09-13 — End: ?

## 2024-09-13 NOTE — Progress Notes (Signed)
 [facility]  [address]  [place] [address]        Telephone Visit    Name:  Christine Mcmillan MRN: [id number]   Date:  09/13/2024 Age:   78 y.o.     The patient/family initiated a request for telephone service.  Verbal consent for this service was obtained from the patient/family.    TELEMEDICINE DOCUMENTATION:    [facility]    Patient/family aware of provider location:  yes  Patient/family consent for telemedicine:  yes  Examination observed and performed by:  Greig Seats, PA-C    This note was created with assistance from [employer] via capture of conversational audio.  Consent was obtained from the patient prior to recording.      Chief Complaint   Patient presents with    Medication Refill     Follow-up        History of Present Illness  Christine Mcmillan is a 78 year old female who has been seen today via TeleMed for routine follow-up medication refill as well as complaints of bilateral knee pain and allergies.      She complains of significant bilateral knee pain, describing it as 'killing me.' for the past couple weeks.  She has a history of arthritis and has used in the past topical treatments such as Voltaren gel and aspirin cream for relief however states pain worse and thinks she needs possibly some prednisone  which helps significantly better.  It has been over a year since she last used prednisone  for her symptoms. She does have a prescription for Tylenol  No. 3 p.r.n. pain and requests a refill of her medication for pain management.    In addition to knee pain, she is experiencing allergy symptoms with runny nose watery eyes cough.  She occasionally takes Zyrtec for her allergies but does not have a regular allergy medication regimen. She requests a refill of promethazine  cough medication to help manage her symptoms.        Current meds:     acetaminophen -codeine  (TYLENOL  #3) 300-30 mg Oral Tablet Take 1 Tablet by mouth Every 6 hours as needed     cyclobenzaprine  (FLEXERIL ) 10 mg Oral Tablet Take 1 Tablet (10 mg total) by mouth Three times a day    ergocalciferol , vitamin D2, (DRISDOL ) 1,250 mcg (50,000 unit) Oral Capsule Take 1 Capsule (50,000 Units total) by mouth Every 7 days    furosemide  (LASIX ) 40 mg Oral Tablet Take 1 Tablet (40 mg total) by mouth Once per day as needed for Other (Edema)    LORazepam  (ATIVAN ) 2 mg Oral Tablet Take 1 Tablet (2 mg total) by mouth Every night as needed for Anxiety    NALOXONE 4 MG/ACTUATION NASAL SPRAY 1 Spray by INTRANASAL route Every 2 minutes as needed for actual or suspected opioid overdose. Call 911 if used.    nebivoloL  (BYSTOLIC ) 10 mg Oral Tablet Take 1 Tablet (10 mg total) by mouth Daily    nitrofurantoin  macrocrystaL (MACRODANTIN ) 50 mg Oral Capsule Take 1 Capsule (50 mg total) by mouth Every night    omeprazole  (PRILOSEC) 20 mg Oral Capsule, Delayed Release(E.C.) Take 1 Capsule (20 mg total) by mouth Daily    pravastatin  (PRAVACHOL ) 80 mg Oral Tablet Take 1 Tablet (80 mg total) by mouth Daily        Allergies   Allergen Reactions    Acetic Acid-Aluminum Acetate Swelling    Sertraline  Other Adverse Reaction (Add comment)  Headache          Past Medical History:   Diagnosis Date    Abnormal renal function     Anxiety     Arthralgia     Bell's palsy     Chronic hypokalemia     COVID-19 vaccine series completed     Depression     Esophageal reflux     History of memory loss     History of recurrent UTIs     Hypertension         Social History     Socioeconomic History    Marital status: Divorced   Tobacco Use    Smoking status: Never    Smokeless tobacco: Never   Substance and Sexual Activity    Alcohol use: Never    Drug use: Never     Social Determinants of Health     Financial Resource Strain: Low Risk (07/22/2022)    Financial Resource Strain     SDOH Financial: No   Transportation Needs: Low Risk (07/22/2022)    Transportation Needs     SDOH Transportation: No   Social Connections: Low Risk (07/22/2022)     Social Connections     SDOH Social Isolation: 5 or more times a week   Intimate Partner Violence: Low Risk (07/22/2022)    Intimate Partner Violence     SDOH Domestic Violence: No   Housing Stability: Low Risk (07/22/2022)    Housing Stability     SDOH Housing Situation: I have housing.     SDOH Housing Worry: No        Past Surgical History:   Procedure Laterality Date    BRAIN TUMOR EXCISION      HX LIPOMA RESECTION N/A     Lipoma on back        Family Medical History:       Problem Relation (Age of Onset)    Cancer Mother             Physical Exam  Constitutional:       Comments: Patient able to relay history today without issue   Pulmonary:      Effort: Pulmonary effort is normal.   Neurological:      Mental Status: She is alert.   Psychiatric:         Mood and Affect: Mood normal.      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  -rx prednisone  20 b.i.d. x5 days  - continue symptomatic treatment for knee pain including use of Tylenol  No. 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  -rx prednisone  20 mg b.i.d. x5 days  - continue symptomatic treatment for knee pain including use of Tylenol  No. 3 PRN pain along with topical NSAIDs  -follow-up with Orthopedic as scheduled   Environmental and seasonal allergies  Seasonal allergies with the current flare.  Postnasal drainage also noted with increase in cough symptoms with patient noting history of chronic cough.  -refill Prometh  DM PRN cough  -prescribe prednisone  20 b.i.d. x5 days  -restart over-the-counter allergy medicine daily  Chronic cough  History of chronic cough symptoms related to patient's seasonal allergies/postnasal drip.  -refilled Prometh  DM p.r.n. cough  Orders Placed This Encounter    LORazepam  (ATIVAN ) 2 mg Oral Tablet    promethazine -dextromethorphan (PHENERGAN -DM) 6.25-15 mg/5 mL Oral Syrup    ergocalciferol , vitamin D2, (DRISDOL ) 1,250 mcg (50,000 unit) Oral Capsule    nebivoloL  (BYSTOLIC ) 10 mg Oral Tablet    omeprazole  (PRILOSEC) 20 mg Oral Capsule, Delayed Release(E.C.)    nitrofurantoin  macrocrystaL (MACRODANTIN ) 50 mg Oral Capsule      Total provider time spent with the patient on the phone: 12  minutes.    Post-Discharge Follow Up Appointments       Thursday Nov 08, 2024    Return Patient Visit with Scharlene No, PA-C at  2:00 PM      Wednesday Nov 28, 2024    Return Telephone Visit with Scharlene No, PA-C at  1:00 PM      Monday Dec 10, 2024    Return Patient Visit with Scharlene No, PA-C at  2:30 PM      Monday Jan 07, 2025    Return Patient Visit with Scharlene No, PA-C at  1:30 PM      Monday Feb 04, 2025    Return Patient Visit with Scharlene No, PA-C at  1:30 PM      [facility]  [facility]  [address]  [place] [address]  [phone]              This note was partially created using [employer] Fluency Direct system (voice recognition software) and is inherently subject to errors including those of syntax and sound-alike substitutions which may escape proofreading.  In such instances, original meaning may be extrapolated by contextual derivation.    Jakaiden Fill, PA-C

## 2024-10-09 NOTE — ED Provider Notes (Signed)
 ED Provider Note    Subjective   History of Present Illness:  Christine Mcmillan is a 77 y.o. 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    Patient presents to the Emergency Department c/o head injury after fall.  Patient reports she tripped on a floor mat yesterday about 5 PM causing her to fall into a little lamp breaking and hitting her across her forehead - she also cut her right middle finger and has pain in her left knee with some difficulty walking on it today.  She states the rescue squad did come check on her last night and tell her they were concerned about a brain bleed and wanted to bring her to the ED but she refused because they had to take a four wheeler to get to her house the road was so bad - she decided she would wait and come in today.  She states she does not currently use a cane or walker but given her history with frequent falls she plans to start using a walker.  She denies taking blood thinners, loss of consciousness, other injuries, abdominal pain, chest pain, back pain, or any other complaints.  She states she has not taken any medications today and has not taken her blood pressure medication in a few days - blood pressure at time of exam is 199/122.      History provided by:  Patient and medical records  Language interpreter used: No        Objective     Vitals <redacted file path>:  ED Triage Vitals [10/09/24 1333]   BP Pulse Resp Temp SpO2 O2 Flow Rate (L/min)   (!) 193/117 87 18 98.2 F (36.8 C) 96 % --     Physical Exam  Vitals and nursing note reviewed.   Constitutional:       General: She is not in acute distress.     Appearance: Normal appearance.   HENT:      Head: Normocephalic. Contusion present.      Comments: Contusion over bilateral eyebrows.  Eyes:       General: No scleral icterus.        Right eye: No discharge.         Left eye: No discharge.      Conjunctiva/sclera: Conjunctivae normal.      Pupils: Pupils are equal, round, and reactive to light.   Neck:      Vascular: No JVD.      Trachea: No tracheal deviation.   Cardiovascular:      Rate and Rhythm: Normal rate and regular rhythm.      Heart sounds: Normal heart sounds. No murmur heard.     No friction rub. No gallop.   Pulmonary:      Effort: Pulmonary effort is normal. No accessory muscle usage or respiratory distress.      Breath sounds: Normal breath sounds. No decreased breath sounds, wheezing or rales.   Chest:      Chest wall: No tenderness.   Abdominal:      General: Bowel sounds are normal. There is no distension.      Palpations: Abdomen is soft.      Tenderness: There is no abdominal tenderness. There is no guarding or rebound. Negative signs include Murphy's sign and  McBurney's sign.   Musculoskeletal:         General: Normal range of motion.      Cervical back: Normal range of motion and neck supple.      Left knee: Swelling present. Tenderness present.      Comments: Left knee mildly swollen with tenderness over the medial aspect.   Lymphadenopathy:      Cervical: No cervical adenopathy.   Skin:     General: Skin is warm and dry.      Findings: No erythema or rash.   Neurological:      Mental Status: She is alert and oriented to person, place, and time.      Cranial Nerves: No cranial nerve deficit.      Motor: No abnormal muscle tone.      Coordination: Coordination normal.             Assessment and Plan:             Medical Decision Making  S/p mechanical fall.  CT head/neck negative, xray knee negative.  Not taking blood thinners.  Contusion over eyes.  Was able to ambulate on her own, at baseline.  Discharged home.    Amount and/or Complexity of Data Reviewed  Labs: ordered.  Radiology: ordered.    Risk  Prescription drug management.     Risk in the MDM section refers to billing  criteria on potential for complications and/or morbidity/mortality of management as defined by the [facility] and CMS    ED Course <redacted file path>:      ED Course as of 10/10/24 1019   Tue Oct 09, 2024   1347 Patient seen and examined. [BF]      ED Course User Index  [BF] Fine, Dena BROCKS, Scribe             Clinical Impression <redacted file path>:  1. Fall on same level from stumbling, initial encounter    2. Chronic pain of left knee    3. HTN (hypertension), benign        Condition: Stable and Improved    Disposition <redacted file path>:Discharged        Documentation prepared by Dena BROCKS Killings, Scribe acting as medical scribe for Dr. Evalene JINNY Medal, DO.  I was present and performed active documentation with the provider while the patient was being cared for in the emergency department. 10/09/24 1:51 PM    I verify the authenticity of this record as a scribed note undertaken during the medical encounter.  I performed the scribed service. The documentation accurately reflects the services I performed. Timothy J Fortuna, DO

## 2024-10-11 ENCOUNTER — Encounter (INDEPENDENT_AMBULATORY_CARE_PROVIDER_SITE_OTHER): Payer: Self-pay | Admitting: Physician Assistant

## 2024-10-11 ENCOUNTER — Other Ambulatory Visit: Payer: Self-pay

## 2024-10-11 ENCOUNTER — Ambulatory Visit: Payer: Self-pay | Attending: Physician Assistant | Admitting: Physician Assistant

## 2024-10-11 DIAGNOSIS — M25562 Pain in left knee: Secondary | ICD-10-CM

## 2024-10-11 DIAGNOSIS — I10 Essential (primary) hypertension: Secondary | ICD-10-CM

## 2024-10-11 DIAGNOSIS — G8929 Other chronic pain: Secondary | ICD-10-CM

## 2024-10-11 DIAGNOSIS — Z9181 History of falling: Secondary | ICD-10-CM

## 2024-10-11 DIAGNOSIS — R2681 Unsteadiness on feet: Secondary | ICD-10-CM

## 2024-10-11 DIAGNOSIS — R6889 Other general symptoms and signs: Secondary | ICD-10-CM

## 2024-10-11 DIAGNOSIS — Z09 Encounter for follow-up examination after completed treatment for conditions other than malignant neoplasm: Secondary | ICD-10-CM

## 2024-10-11 DIAGNOSIS — M25561 Pain in right knee: Secondary | ICD-10-CM

## 2024-10-11 MED ORDER — CYCLOBENZAPRINE 10 MG TABLET: 10.0000 mg | ORAL_TABLET | Freq: Three times a day (TID) | ORAL | 1 refills | Status: DC

## 2024-10-11 MED ORDER — ACETAMINOPHEN 300 MG-CODEINE 30 MG TABLET: 1.0000 | ORAL_TABLET | Freq: Four times a day (QID) | ORAL | 0 refills | Status: DC | PRN
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
## 2024-10-15 ENCOUNTER — Telehealth (INDEPENDENT_AMBULATORY_CARE_PROVIDER_SITE_OTHER): Payer: Self-pay | Admitting: Physician Assistant
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
## 2024-10-22 NOTE — Assessment & Plan Note (Signed)
 Chronic anxiety with history of depression also noted.  Patient has Ativan  2 mg to be used at night PRN.  Patient says Ativan  also helps her rest noting the insomnia in the past has increased her anxiety levels.  She is aware of the increased risk taking benzos along side of opioids greater than age 79.  Patient reports no previous side effects or issues with medication use however.  Discussed weaning trial however Ativan  which she states she will attempt to cut the Ativan  in half in use 1 mg nightly to see if it is as effective.

## 2024-10-22 NOTE — Assessment & Plan Note (Signed)
 Abnormal glucose with previous A1c level stable.  -obtain glucose A1c and blood work

## 2024-10-22 NOTE — Assessment & Plan Note (Signed)
 Hypertension stable with use of Bystolic  10 mg daily.  Blood pressure today 132/88.  -continue Bystolic  along with blood pressure monitoring

## 2024-10-25 NOTE — Assessment & Plan Note (Signed)
 History of chronic cough symptoms related to patient's seasonal allergies/postnasal drip.  -refilled Prometh  DM p.r.n. cough

## 2024-10-25 NOTE — Assessment & Plan Note (Signed)
 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  -rx prednisone  20 mg b.i.d. x5 days  - continue symptomatic treatment for knee pain including use of Tylenol  No. 3 PRN pain along with topical NSAIDs  -follow-up with Orthopedic as scheduled

## 2024-10-25 NOTE — Assessment & Plan Note (Signed)
 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  -rx prednisone  20 b.i.d. x5 days  - continue symptomatic treatment for knee pain including use of Tylenol  No. 3 PRN pain along with topical NSAIDs  -follow-up with Orthopedic as scheduled

## 2024-11-08 ENCOUNTER — Encounter (INDEPENDENT_AMBULATORY_CARE_PROVIDER_SITE_OTHER): Payer: Self-pay | Admitting: Physician Assistant

## 2024-11-08 ENCOUNTER — Ambulatory Visit: Payer: Medicare (Managed Care) | Attending: Physician Assistant | Admitting: Physician Assistant

## 2024-11-08 ENCOUNTER — Other Ambulatory Visit: Payer: Self-pay

## 2024-11-08 DIAGNOSIS — I10 Essential (primary) hypertension: Secondary | ICD-10-CM

## 2024-11-08 MED ORDER — NITROFURANTOIN MACROCRYSTAL 50 MG CAPSULE: 50.0000 mg | ORAL_CAPSULE | Freq: Every evening | ORAL | 1 refills | Status: AC

## 2024-11-08 MED ORDER — FUROSEMIDE 40 MG TABLET: 40.0000 mg | ORAL_TABLET | Freq: Every day | ORAL | 1 refills | Status: AC | PRN

## 2024-11-08 MED ORDER — ERGOCALCIFEROL (VITAMIN D2) 1,250 MCG (50,000 UNIT) CAPSULE: 50000.0000 [IU] | ORAL_CAPSULE | ORAL | 3 refills | Status: AC

## 2024-11-08 MED ORDER — ACETAMINOPHEN 300 MG-CODEINE 30 MG TABLET: 1.0000 | ORAL_TABLET | Freq: Four times a day (QID) | ORAL | 0 refills | Status: DC | PRN

## 2024-11-08 MED ORDER — CYCLOBENZAPRINE 10 MG TABLET: 10.0000 mg | ORAL_TABLET | Freq: Three times a day (TID) | ORAL | 1 refills | Status: AC

## 2024-11-08 MED ORDER — PROMETHAZINE-DM 6.25 MG-15 MG/5 ML ORAL SYRUP: 5.0000 mL | ORAL_SOLUTION | Freq: Four times a day (QID) | ORAL | 0 refills | Status: AC | PRN

## 2024-11-08 MED ORDER — LORAZEPAM 2 MG TABLET: 2.0000 mg | ORAL_TABLET | Freq: Every evening | ORAL | 2 refills | Status: AC | PRN
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
## 2024-11-21 ENCOUNTER — Telehealth (INDEPENDENT_AMBULATORY_CARE_PROVIDER_SITE_OTHER): Payer: Self-pay | Admitting: Physician Assistant

## 2024-11-21 ENCOUNTER — Other Ambulatory Visit (INDEPENDENT_AMBULATORY_CARE_PROVIDER_SITE_OTHER): Payer: Self-pay | Admitting: Physician Assistant

## 2024-11-21 NOTE — Telephone Encounter (Signed)
 Patient contacted office stating that her Tylenol  #3 is usually called in to [facility] for 120 tablets and this time when it was called in on 11/08/24 it was only called in for 30 tablets.

## 2024-11-21 NOTE — Telephone Encounter (Signed)
 Per Amy will speak and discuss with patient on next appointment in February.

## 2024-11-24 NOTE — Assessment & Plan Note (Signed)
 Knee pain  Chronic knee pain exacerbated by fall. X-ray showed osteopenia and degenerative changes with mild joint effusion, unchanged from previous imaging.  - Continue current pain management regimen.  - Consider physical therapy if pain persists or worsens.

## 2024-11-25 ENCOUNTER — Other Ambulatory Visit: Payer: Self-pay

## 2024-11-28 ENCOUNTER — Ambulatory Visit (INDEPENDENT_AMBULATORY_CARE_PROVIDER_SITE_OTHER): Payer: Medicare (Managed Care) | Admitting: Physician Assistant

## 2024-11-28 ENCOUNTER — Other Ambulatory Visit: Payer: Self-pay

## 2024-11-29 ENCOUNTER — Ambulatory Visit: Admitting: Physician Assistant

## 2024-11-30 ENCOUNTER — Ambulatory Visit: Payer: Medicare (Managed Care) | Admitting: Physician Assistant

## 2024-12-10 ENCOUNTER — Ambulatory Visit (INDEPENDENT_AMBULATORY_CARE_PROVIDER_SITE_OTHER): Payer: Self-pay | Admitting: Physician Assistant

## 2025-01-07 ENCOUNTER — Ambulatory Visit (INDEPENDENT_AMBULATORY_CARE_PROVIDER_SITE_OTHER): Payer: Self-pay | Admitting: Physician Assistant

## 2025-02-04 ENCOUNTER — Ambulatory Visit (INDEPENDENT_AMBULATORY_CARE_PROVIDER_SITE_OTHER): Payer: Self-pay | Admitting: Physician Assistant
